# Patient Record
Sex: Female | Born: 1955
Health system: Southern US, Community
[De-identification: ages and names within clinical notes are randomized; demographics above are authoritative.]

## PROBLEM LIST (undated history)

## (undated) DIAGNOSIS — Z8616 Personal history of COVID-19: Secondary | ICD-10-CM

## (undated) DIAGNOSIS — T7840XA Allergy, unspecified, initial encounter: Secondary | ICD-10-CM

## (undated) DIAGNOSIS — F411 Generalized anxiety disorder: Secondary | ICD-10-CM

## (undated) DIAGNOSIS — I6381 Other cerebral infarction due to occlusion or stenosis of small artery: Secondary | ICD-10-CM

## (undated) DIAGNOSIS — I639 Cerebral infarction, unspecified: Secondary | ICD-10-CM

## (undated) DIAGNOSIS — E039 Hypothyroidism, unspecified: Secondary | ICD-10-CM

## (undated) DIAGNOSIS — I679 Cerebrovascular disease, unspecified: Secondary | ICD-10-CM

## (undated) DIAGNOSIS — F4321 Adjustment disorder with depressed mood: Secondary | ICD-10-CM

## (undated) DIAGNOSIS — F329 Major depressive disorder, single episode, unspecified: Secondary | ICD-10-CM

## (undated) DIAGNOSIS — I10 Essential (primary) hypertension: Secondary | ICD-10-CM

## (undated) DIAGNOSIS — K589 Irritable bowel syndrome without diarrhea: Secondary | ICD-10-CM

## (undated) DIAGNOSIS — R011 Cardiac murmur, unspecified: Secondary | ICD-10-CM

## (undated) DIAGNOSIS — F32A Depression, unspecified: Secondary | ICD-10-CM

## (undated) HISTORY — DX: Generalized anxiety disorder: F41.1

## (undated) HISTORY — DX: Cerebral infarction, unspecified: I63.9

## (undated) HISTORY — DX: Personal history of COVID-19: Z86.16

## (undated) HISTORY — DX: Irritable bowel syndrome, unspecified: K58.9

## (undated) HISTORY — DX: Other cerebral infarction due to occlusion or stenosis of small artery: I63.81

## (undated) HISTORY — DX: Major depressive disorder, single episode, unspecified: F32.9

## (undated) HISTORY — DX: Depression, unspecified: F32.A

## (undated) HISTORY — DX: Adjustment disorder with depressed mood: F43.21

## (undated) HISTORY — DX: Hypothyroidism, unspecified: E03.9

## (undated) HISTORY — DX: Cerebrovascular disease, unspecified: I67.9

## (undated) HISTORY — DX: Cardiac murmur, unspecified: R01.1

## (undated) HISTORY — PX: CHOLECYSTECTOMY: SHX55

## (undated) HISTORY — DX: Allergy, unspecified, initial encounter: T78.40XA

---

## 2020-10-23 ENCOUNTER — Other Ambulatory Visit (HOSPITAL_COMMUNITY): Payer: Self-pay

## 2020-10-25 ENCOUNTER — Other Ambulatory Visit (HOSPITAL_COMMUNITY): Payer: Self-pay

## 2020-10-30 ENCOUNTER — Emergency Department (HOSPITAL_COMMUNITY)
Admission: EM | Admit: 2020-10-30 | Discharge: 2020-10-31 | Disposition: A | Discharge status: Home / Self Care | Payer: Medicare Other | Attending: Emergency Medicine | Admitting: Emergency Medicine

## 2020-10-30 ENCOUNTER — Emergency Department (HOSPITAL_COMMUNITY): Payer: Medicare Other

## 2020-10-30 ENCOUNTER — Encounter (HOSPITAL_COMMUNITY): Payer: Self-pay | Admitting: *Deleted

## 2020-10-30 ENCOUNTER — Other Ambulatory Visit: Payer: Self-pay

## 2020-10-30 DIAGNOSIS — E876 Hypokalemia: Secondary | ICD-10-CM | POA: Insufficient documentation

## 2020-10-30 DIAGNOSIS — R519 Headache, unspecified: Secondary | ICD-10-CM | POA: Insufficient documentation

## 2020-10-30 DIAGNOSIS — D72829 Elevated white blood cell count, unspecified: Secondary | ICD-10-CM | POA: Diagnosis not present

## 2020-10-30 DIAGNOSIS — T502X5A Adverse effect of carbonic-anhydrase inhibitors, benzothiadiazides and other diuretics, initial encounter: Secondary | ICD-10-CM | POA: Insufficient documentation

## 2020-10-30 DIAGNOSIS — I10 Essential (primary) hypertension: Secondary | ICD-10-CM | POA: Diagnosis not present

## 2020-10-30 DIAGNOSIS — Z79899 Other long term (current) drug therapy: Secondary | ICD-10-CM | POA: Diagnosis not present

## 2020-10-30 DIAGNOSIS — I6782 Cerebral ischemia: Secondary | ICD-10-CM | POA: Insufficient documentation

## 2020-10-30 DIAGNOSIS — R29810 Facial weakness: Secondary | ICD-10-CM | POA: Diagnosis present

## 2020-10-30 DIAGNOSIS — G510 Bell's palsy: Secondary | ICD-10-CM | POA: Insufficient documentation

## 2020-10-30 DIAGNOSIS — R9082 White matter disease, unspecified: Secondary | ICD-10-CM | POA: Insufficient documentation

## 2020-10-30 HISTORY — DX: Essential (primary) hypertension: I10

## 2020-10-30 LAB — DIFFERENTIAL
Abs Immature Granulocytes: 0.1 10*3/uL — ABNORMAL HIGH (ref 0.00–0.07)
Basophils Absolute: 0.1 10*3/uL (ref 0.0–0.1)
Basophils Relative: 1 %
Eosinophils Absolute: 0.2 10*3/uL (ref 0.0–0.5)
Eosinophils Relative: 1 %
Immature Granulocytes: 1 %
Lymphocytes Relative: 18 %
Lymphs Abs: 3.4 10*3/uL (ref 0.7–4.0)
Monocytes Absolute: 1.2 10*3/uL — ABNORMAL HIGH (ref 0.1–1.0)
Monocytes Relative: 6 %
Neutro Abs: 14 10*3/uL — ABNORMAL HIGH (ref 1.7–7.7)
Neutrophils Relative %: 73 %

## 2020-10-30 LAB — PROTIME-INR
INR: 1 (ref 0.8–1.2)
Prothrombin Time: 12.5 seconds (ref 11.4–15.2)

## 2020-10-30 LAB — CBC
HCT: 44.9 % (ref 36.0–46.0)
Hemoglobin: 14.5 g/dL (ref 12.0–15.0)
MCH: 27.8 pg (ref 26.0–34.0)
MCHC: 32.3 g/dL (ref 30.0–36.0)
MCV: 86 fL (ref 80.0–100.0)
Platelets: 392 10*3/uL (ref 150–400)
RBC: 5.22 MIL/uL — ABNORMAL HIGH (ref 3.87–5.11)
RDW: 15.2 % (ref 11.5–15.5)
WBC: 19.1 10*3/uL — ABNORMAL HIGH (ref 4.0–10.5)
nRBC: 0 % (ref 0.0–0.2)

## 2020-10-30 LAB — COMPREHENSIVE METABOLIC PANEL
ALT: 17 U/L (ref 0–44)
AST: 22 U/L (ref 15–41)
Albumin: 3.6 g/dL (ref 3.5–5.0)
Alkaline Phosphatase: 80 U/L (ref 38–126)
Anion gap: 14 (ref 5–15)
BUN: 18 mg/dL (ref 8–23)
CO2: 24 mmol/L (ref 22–32)
Calcium: 9.3 mg/dL (ref 8.9–10.3)
Chloride: 101 mmol/L (ref 98–111)
Creatinine, Ser: 0.89 mg/dL (ref 0.44–1.00)
GFR, Estimated: >60 mL/min (ref >60)
Glucose, Bld: 134 mg/dL — ABNORMAL HIGH (ref 70–99)
Potassium: 3 mmol/L — ABNORMAL LOW (ref 3.5–5.1)
Sodium: 139 mmol/L (ref 135–145)
Total Bilirubin: 0.5 mg/dL (ref 0.3–1.2)
Total Protein: 7.9 g/dL (ref 6.5–8.1)

## 2020-10-30 LAB — APTT: aPTT: 26 seconds (ref 24–36)

## 2020-10-30 MED ORDER — SODIUM CHLORIDE 0.9% FLUSH: 3.0000 mL | Freq: Once | INTRAVENOUS | Status: DC

## 2020-10-30 MED FILL — HYDROCHLOROTHIAZIDE 25 MG T: 25 | 30 days supply | Qty: 30 | Fill #0

## 2020-10-30 MED FILL — AMLODIPINE BESYLATE 5 MG TA: 5 | 30 days supply | Qty: 30 | Fill #0

## 2020-10-30 NOTE — ED Triage Notes (Signed)
Pt reports having recent infection to right ear and being treated with antibiotics for it. Pt reports onset Saturday evening of right side facial droop. Difficulty closing right eye. No weakness noted to arms or legs. Only other complaint is headache.

## 2020-10-31 ENCOUNTER — Other Ambulatory Visit (HOSPITAL_COMMUNITY): Payer: Self-pay | Admitting: Emergency Medicine

## 2020-10-31 MED ORDER — POTASSIUM CHLORIDE CRYS ER 20 MEQ PO TBCR
40.0000 meq | EXTENDED_RELEASE_TABLET | Freq: Once | ORAL | Status: AC
  Administered 2020-10-31: 40 meq via ORAL
  Filled 2020-10-31: qty 2

## 2020-10-31 MED ORDER — VALACYCLOVIR HCL 1 G PO TABS: 1000.0000 mg | ORAL_TABLET | Freq: Three times a day (TID) | ORAL | 0 refills | Status: DC

## 2020-10-31 MED ORDER — POTASSIUM CHLORIDE CRYS ER 20 MEQ PO TBCR: EXTENDED_RELEASE_TABLET | ORAL | 0 refills | Status: DC

## 2020-10-31 NOTE — Discharge Instructions (Signed)
Use artificial tears as needed.  Because your eye does not close, you should taper it shut when you go to sleep at night.

## 2020-10-31 NOTE — ED Provider Notes (Signed)
[facility] EMERGENCY DEPARTMENT Provider Note   CSN: [id number] Arrival date & time: 10/30/20  1737   History Chief Complaint  Patient presents with  . Facial Droop    Julia Gutierrez is a 65 y.o. female.  The history is provided by the patient.  She has history of hypertension and comes in because of inability to close her right eye.  She noted an infection in the right ear about 1 week ago.  There was an associated headache.  She went to an urgent care center where she was given a prescription for amoxicillin and prednisone.  The ear does seem to be getting better, but 3 days ago she noted that she was having difficulty closing her right eye and it was feeling irritated.  Yesterday, she noted that her mouth did not seem to be working right.  She denies any weakness or numbness or tingling in her arms or legs.  Headache has been improving.  She denies fever or chills.  Past Medical History:  Diagnosis Date  . Hypertension     There are no problems to display for this patient.   History reviewed. No pertinent surgical history.   OB History   No obstetric history on file.     History reviewed. No pertinent family history.  Social History   Tobacco Use  . Smoking status: Never Smoker  Substance Use Topics  . Alcohol use: Not Currently  . Drug use: Not Currently    Home Medications Prior to Admission medications   Medication Sig Start Date End Date Taking? Authorizing Provider  potassium chloride SA (KLOR-CON) 20 MEQ tablet Take 1 tablet twice a day for the next 10 days, then take 1 tablet daily 10/31/20  Yes Dione Booze, MD  valACYclovir (VALTREX) 1000 MG tablet Take 1 tablet (1,000 mg total) by mouth 3 (three) times daily. 10/31/20  Yes Dione Booze, MD    Allergies    Patient has no active allergies.  Review of Systems   Review of Systems  All other systems reviewed and are negative.   Physical Exam Updated Vital Signs BP (!) 156/97 (BP  Location: Right Arm)   Pulse 77   Temp 98.6 F (37 C) (Oral)   Resp 18   Ht 5' 2.5" (1.588 m)   Wt 83.9 kg   SpO2 97%   BMI 33.30 kg/m   Physical Exam Vitals and nursing note reviewed.   65 year old female, resting comfortably and in no acute distress. Vital signs are significant for elevated blood pressure. Oxygen saturation is 97%, which is normal. Head is normocephalic and atraumatic. PERRLA, EOMI. Oropharynx is clear. Neck is nontender and supple without adenopathy or JVD. Back is nontender and there is no CVA tenderness. Lungs are clear without rales, wheezes, or rhonchi. Chest is nontender. Heart has regular rate and rhythm without murmur. Abdomen is soft, flat, nontender without masses or hepatosplenomegaly and peristalsis is normoactive. Extremities have no cyanosis or edema, full range of motion is present. Skin is warm and dry without rash. Neurologic: Mental status is normal, moderately severe right central facial droop is present - remainder of cranial nerves are intact, there are no motor or sensory deficits.  Motor strength is 5/5 in all 4 extremities.  There is no pronator drift.  Station and gait are normal.  ED Results / Procedures / Treatments   Labs (all labs ordered are listed, but only abnormal results are displayed) Labs Reviewed  CBC - Abnormal; Notable  for the following components:      Result Value   WBC 19.1 (*)    RBC 5.22 (*)    All other components within normal limits  DIFFERENTIAL - Abnormal; Notable for the following components:   Neutro Abs 14.0 (*)    Monocytes Absolute 1.2 (*)    Abs Immature Granulocytes 0.10 (*)    All other components within normal limits  COMPREHENSIVE METABOLIC PANEL - Abnormal; Notable for the following components:   Potassium 3.0 (*)    Glucose, Bld 134 (*)    All other components within normal limits  PROTIME-INR  APTT    EKG EKG Interpretation  Date/Time:  Monday October 30 2020 17:59:17 EST Ventricular  Rate:  100 PR Interval:  152 QRS Duration: 72 QT Interval:  340 QTC Calculation: 438 R Axis:   14 Text Interpretation: Normal sinus rhythm Nonspecific ST abnormality Abnormal ECG No old tracing to compare Confirmed by Dione Booze ([id number]) on 10/31/2020 12:05:12 AM   Radiology CT HEAD WO CONTRAST  Result Date: 10/30/2020 CLINICAL DATA:  Neuro deficit, acute, stroke suspected; facial droop. Additional history provided: Patient reports having recent infection to the right ear and being treated with antibiotics, right-sided facial droop onset Saturday, difficulty closing right eye, headache. EXAM: CT HEAD WITHOUT CONTRAST TECHNIQUE: Contiguous axial images were obtained from the base of the skull through the vertex without intravenous contrast. COMPARISON:  No pertinent prior exams available for comparison. FINDINGS: Brain: Cerebral volume is normal for age. Patchy and ill-defined hypoattenuation within the cerebral white matter is nonspecific, but most often secondary to chronic small vessel ischemia. There is no acute intracranial hemorrhage. No demarcated cortical infarct. No extra-axial fluid collection. No evidence of intracranial mass. No midline shift. Vascular: No hyperdense vessel. Atherosclerotic calcifications. Skull: Normal. Negative for fracture or focal lesion. Sinuses/Orbits: Visualized orbits show no acute finding. No significant paranasal sinus disease at the imaged levels. Other: No appreciable significant middle ear or mastoid effusion. IMPRESSION: No evidence of acute intracranial abnormality. Advanced cerebral white matter disease, nonspecific but most often secondary to chronic small vessel ischemia. Electronically Signed   By: Jackey Loge DO   On: 10/30/2020 18:55    Procedures Procedures   Medications Ordered in ED Medications  sodium chloride flush (NS) 0.9 % injection 3 mL (3 mLs Intravenous Not Given 10/31/20 0503)  potassium chloride SA (KLOR-CON) CR tablet 40 mEq (has no  administration in time range)    ED Course  I have reviewed the triage vital signs and the nursing notes.  Pertinent labs & imaging results that were available during my care of the patient were reviewed by me and considered in my medical decision making (see chart for details).  MDM Rules/Calculators/A&P Right-sided Bell's palsy.  Given the fact that she developed Bell's palsy, I suspect that her ear pain was probably a prodrome of Bell's palsy and not a true ear infection.  She has already been on steroids, no need to continue there is.  She did have a work-up ordered prior to my seeing her.  ECG shows minor nonspecific ST changes with no prior ECG available for comparison.  CT of the head shows no acute process.  Labs show leukocytosis which is probably related to recent steroid use, and hypokalemia which is most likely secondary to the diuretic she is taking for her blood pressure.  She is given a dose of oral potassium and she is discharged with prescription for oral potassium and for valacyclovir for 1week.  Advised to tape her right eye shut at night.  Old records are reviewed, and she has no relevant past visits.  Final Clinical Impression(s) / ED Diagnoses Final diagnoses:  Right-sided Bell's palsy  Elevated blood pressure reading with diagnosis of hypertension  Diuretic-induced hypokalemia    Rx / DC Orders ED Discharge Orders         Ordered    potassium chloride SA (KLOR-CON) 20 MEQ tablet        10/31/20 0503    valACYclovir (VALTREX) 1000 MG tablet  3 times daily        10/31/20 0503           Dione Booze, MD 10/31/20 [phone]

## 2020-11-01 MED FILL — valACYclovir HCL 1 GM TABS: 1 | 7 days supply | Qty: 21 | Fill #0

## 2020-11-01 MED FILL — POTASSIUM CHLORIDE CRYS ER: 20 | 30 days supply | Qty: 40 | Fill #0

## 2020-11-29 ENCOUNTER — Other Ambulatory Visit (HOSPITAL_COMMUNITY): Payer: Self-pay | Admitting: Registered Nurse

## 2020-11-29 MED FILL — AMLODIPINE BESYLATE 5 MG TA: 5 | 90 days supply | Qty: 90 | Fill #0

## 2020-11-29 MED FILL — HYDROCHLOROTHIAZIDE 25 MG T: 25 | 90 days supply | Qty: 90 | Fill #0

## 2021-02-27 MED FILL — Amlodipine Besylate Tab 5 MG (Base Equivalent): ORAL | 90 days supply | Qty: 90 | Fill #0 | Status: AC

## 2021-02-27 MED FILL — Hydrochlorothiazide Tab 25 MG: ORAL | 90 days supply | Qty: 90 | Fill #0 | Status: AC

## 2021-02-28 ENCOUNTER — Other Ambulatory Visit (HOSPITAL_COMMUNITY): Payer: Self-pay

## 2021-03-06 ENCOUNTER — Other Ambulatory Visit (HOSPITAL_COMMUNITY): Payer: Self-pay

## 2021-04-13 ENCOUNTER — Encounter (HOSPITAL_BASED_OUTPATIENT_CLINIC_OR_DEPARTMENT_OTHER): Payer: Self-pay | Admitting: Nurse Practitioner

## 2021-04-13 ENCOUNTER — Ambulatory Visit (INDEPENDENT_AMBULATORY_CARE_PROVIDER_SITE_OTHER): Payer: Medicare Other | Admitting: Nurse Practitioner

## 2021-04-13 ENCOUNTER — Other Ambulatory Visit: Payer: Self-pay

## 2021-04-13 ENCOUNTER — Other Ambulatory Visit (HOSPITAL_COMMUNITY): Payer: Self-pay

## 2021-04-13 VITALS — BP 145/85 | HR 87 | Ht 63.0 in | Wt 181.2 lb

## 2021-04-13 DIAGNOSIS — Z780 Asymptomatic menopausal state: Secondary | ICD-10-CM

## 2021-04-13 DIAGNOSIS — Z7689 Persons encountering health services in other specified circumstances: Secondary | ICD-10-CM | POA: Diagnosis not present

## 2021-04-13 DIAGNOSIS — Z130 Encounter for screening for diseases of the blood and blood-forming organs and certain disorders involving the immune mechanism: Secondary | ICD-10-CM

## 2021-04-13 DIAGNOSIS — Z13820 Encounter for screening for osteoporosis: Secondary | ICD-10-CM

## 2021-04-13 DIAGNOSIS — M350C Sjogren syndrome with dental involvement: Secondary | ICD-10-CM | POA: Insufficient documentation

## 2021-04-13 DIAGNOSIS — H539 Unspecified visual disturbance: Secondary | ICD-10-CM

## 2021-04-13 DIAGNOSIS — Z1211 Encounter for screening for malignant neoplasm of colon: Secondary | ICD-10-CM

## 2021-04-13 DIAGNOSIS — R631 Polydipsia: Secondary | ICD-10-CM

## 2021-04-13 DIAGNOSIS — R42 Dizziness and giddiness: Secondary | ICD-10-CM

## 2021-04-13 DIAGNOSIS — R5382 Chronic fatigue, unspecified: Secondary | ICD-10-CM

## 2021-04-13 DIAGNOSIS — Z0000 Encounter for general adult medical examination without abnormal findings: Secondary | ICD-10-CM

## 2021-04-13 DIAGNOSIS — F419 Anxiety disorder, unspecified: Secondary | ICD-10-CM

## 2021-04-13 DIAGNOSIS — Z1321 Encounter for screening for nutritional disorder: Secondary | ICD-10-CM

## 2021-04-13 DIAGNOSIS — E039 Hypothyroidism, unspecified: Secondary | ICD-10-CM

## 2021-04-13 DIAGNOSIS — Z6832 Body mass index (BMI) 32.0-32.9, adult: Secondary | ICD-10-CM

## 2021-04-13 DIAGNOSIS — G4486 Cervicogenic headache: Secondary | ICD-10-CM

## 2021-04-13 DIAGNOSIS — R002 Palpitations: Secondary | ICD-10-CM

## 2021-04-13 DIAGNOSIS — Z1231 Encounter for screening mammogram for malignant neoplasm of breast: Secondary | ICD-10-CM

## 2021-04-13 DIAGNOSIS — Z1159 Encounter for screening for other viral diseases: Secondary | ICD-10-CM

## 2021-04-13 DIAGNOSIS — R682 Dry mouth, unspecified: Secondary | ICD-10-CM

## 2021-04-13 DIAGNOSIS — Z8639 Personal history of other endocrine, nutritional and metabolic disease: Secondary | ICD-10-CM

## 2021-04-13 DIAGNOSIS — Z114 Encounter for screening for human immunodeficiency virus [HIV]: Secondary | ICD-10-CM

## 2021-04-13 DIAGNOSIS — R531 Weakness: Secondary | ICD-10-CM

## 2021-04-13 DIAGNOSIS — M899 Disorder of bone, unspecified: Secondary | ICD-10-CM

## 2021-04-13 DIAGNOSIS — F32A Depression, unspecified: Secondary | ICD-10-CM

## 2021-04-13 DIAGNOSIS — Z131 Encounter for screening for diabetes mellitus: Secondary | ICD-10-CM

## 2021-04-13 DIAGNOSIS — H04123 Dry eye syndrome of bilateral lacrimal glands: Secondary | ICD-10-CM

## 2021-04-13 DIAGNOSIS — Z23 Encounter for immunization: Secondary | ICD-10-CM

## 2021-04-13 DIAGNOSIS — Z13228 Encounter for screening for other metabolic disorders: Secondary | ICD-10-CM

## 2021-04-13 DIAGNOSIS — Z1329 Encounter for screening for other suspected endocrine disorder: Secondary | ICD-10-CM

## 2021-04-13 HISTORY — DX: Chronic fatigue, unspecified: R53.82

## 2021-04-13 HISTORY — DX: Anxiety disorder, unspecified: F41.9

## 2021-04-13 HISTORY — DX: Dizziness and giddiness: R42

## 2021-04-13 HISTORY — DX: Unspecified visual disturbance: H53.9

## 2021-04-13 HISTORY — DX: Cervicogenic headache: G44.86

## 2021-04-13 HISTORY — DX: Sjogren syndrome with dental involvement: M35.0C

## 2021-04-13 HISTORY — DX: Polydipsia: R63.1

## 2021-04-13 HISTORY — DX: Persons encountering health services in other specified circumstances: Z76.89

## 2021-04-13 HISTORY — DX: Dry mouth, unspecified: R68.2

## 2021-04-13 HISTORY — DX: Palpitations: R00.2

## 2021-04-13 HISTORY — DX: Encounter for general adult medical examination without abnormal findings: Z00.00

## 2021-04-13 LAB — POCT GLYCOSYLATED HEMOGLOBIN (HGB A1C): HbA1c POC (<> result, manual entry): 5.2 % (ref 4.0–5.6)

## 2021-04-13 MED ORDER — ZOSTER VAC RECOMB ADJUVANTED 50 MCG/0.5ML IM SUSR
0.5000 mL | Freq: Once | INTRAMUSCULAR | 1 refills | Status: DC
  Filled 2021-04-13: qty 0.5, 1d supply, fill #0

## 2021-04-13 MED ORDER — ZOSTER VAC RECOMB ADJUVANTED 50 MCG/0.5ML IM SUSR: 0.5000 mL | Freq: Once | INTRAMUSCULAR | 1 refills | Status: AC

## 2021-04-13 NOTE — Progress Notes (Signed)
[name], DNP, AGNP-c Primary Care Services ______________________________________________________________________  HPI Julia Gutierrez is a 65 y.o. female presenting to [facility] today to establish care.   Patient Care Team: Keishon Chavarin, Coralee Pesa, NP as PCP - General (Nurse Practitioner)  Health Maintenance  Topic Date Due   Hepatitis C Screening: USPSTF Recommendation to screen - Ages 61-79 yo.  Never done   Tetanus Vaccine  Never done   Colon Cancer Screening  Never done   Mammogram  Never done   Zoster (Shingles) Vaccine (1 of 2) Never done   DEXA scan (bone density measurement)  Never done   Pneumonia vaccines (1 of 2 - PCV13) Never done   COVID-19 Vaccine (2 - Moderna series) 10/01/2020   Flu Shot  05/07/2021   HPV Vaccine  Aged Out     Concerns today: Establish Care. Discuss ongoing dizziness, fatigue, HA, neck pain  Dizziness Dizzy spells off and on since 2019 Went to hospital with dehydration and decreased kidney function at one point- resolved In January dizzy all of the time, even when sitting still- facial droop presented- seen in UC and ED Dx with Bells palsy and treated with steroids  Improvement with facial droop, but not complete resolution CT negative for infarction Dizziness no better and maybe even worse since that time Feels like she is moving even when still. Movement makes worse Walking is difficult- feels like she may fall, "bumps into walls" "Feel like what I presume as drunk" "Ive gone downhill all over" "Not doing much from being dizzy and I think everything is crashing" When leaning head back and rubbing carotid arteries reports increased dizziness. Was on thyroid medication until 2012 and moved - reports that she was unable to afford the medication at the time and stopped taking it- forgot about it.  Meclizine can help a "teeny bit" when it is really severe.  In the beginning took this  scheduled and felt that it did help a little, but nothing significant Pain, itching, and pressure in ears all the time with right side worse than left.  No loss of consciousness or blacking out, no one sided weakness, no focal deficit, no hx stroke.   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  ASA 377m is helpful for symptoms.  She also endorses changes in her night time vision with the right eye seeing normal and left eye things look "grayed out". This improves when turning on the lights.  Endorses dry eyes since 2019 and dry mouth since 2020. No known cause or change in medications.  Reports "lines" in the tongue. Intermittent palpitations.  Feels like swallowing is difficult and sometimes chokes on water. Unable to swallow large pills. Mucous in throat upon awakening with dry mouth Fatigue with intense waves where "I have to lay down immediately" "I have no energy at all" She feels her hearing is decreased in the recent years. She denies constant ringing.   Reports WBC has been elevated for several years. She tells me her previous provider wanted her to see oncology for evaluation with concerns for lymphoma. She tells me she does not feel that she has cancer and did not go.   Patient Active Problem List   Diagnosis Date Noted   Encounter to establish care 04/13/2021  Vertigo 04/13/2021   Chronic fatigue 04/13/2021   Cervicogenic headache 04/13/2021   Hypothyroidism 04/13/2021   Mouth dryness 04/13/2021   Bilateral dry eyes 04/13/2021   Anxiety and depression 04/13/2021   Increased thirst 04/13/2021   Vision changes 04/13/2021   Palpitations 04/13/2021    PHQ9 Today: Depression screen PHQ 2/9 04/13/2021  Decreased Interest 1  Down, Depressed, Hopeless 1  PHQ - 2 Score 2  Altered sleeping 3  Tired, decreased  energy 3  Change in appetite 3  Feeling bad or failure about yourself  3  Trouble concentrating 3  Moving slowly or fidgety/restless 3  Suicidal thoughts 0  PHQ-9 Score 20  Difficult doing work/chores Somewhat difficult   GAD7 Today: GAD 7 : Generalized Anxiety Score 04/13/2021  Nervous, Anxious, on Edge 0  Control/stop worrying 2  Worry too much - different things 2  Trouble relaxing 0  Restless 0  Easily annoyed or irritable 2  Afraid - awful might happen 2  Total GAD 7 Score 8  Anxiety Difficulty Somewhat difficult   ______________________________________________________________________ PMH Past Medical History:  Diagnosis Date   Hypertension    Hypothyroidism     ROS All review of systems negative except what is listed in the HPI  PHYSICAL EXAM Physical Exam Vitals and nursing note reviewed.  HENT:     Head: Normocephalic and atraumatic.     Right Ear: Tympanic membrane, ear canal and external ear normal.     Left Ear: Tympanic membrane, ear canal and external ear normal.     Nose: Nose normal.     Mouth/Throat:     Mouth: Mucous membranes are pale and dry.     Pharynx: Oropharynx is clear. No oropharyngeal exudate or posterior oropharyngeal erythema.     Comments: Geographic tongue present.  Oral MM dry Eyes:     General: Lids are normal. Vision grossly intact. No visual field deficit.    Extraocular Movements:     Right eye: Nystagmus present.     Left eye: No nystagmus.     Conjunctiva/sclera: Conjunctivae normal.     Pupils:     Right eye: Pupil is sluggish.     Left eye: Pupil is sluggish.     Funduscopic exam:    Right eye: Red reflex present.        Left eye: Red reflex present.    Visual Fields: Right eye visual fields normal and left eye visual fields normal.     Comments: Right eye gaze appears very slightly deviated to the right.  Left eye gaze intact.   Neck:     Thyroid: No thyroid mass, thyromegaly or thyroid tenderness.     Vascular: No  carotid bruit.     Trachea: Trachea and phonation normal.  Cardiovascular:     Rate and Rhythm: Normal rate and regular rhythm.     Pulses: Normal pulses.     Heart sounds: Murmur heard.  Systolic murmur is present with a grade of 3/6.     Comments: Reports lifelong history of murmur with previous cardiac work-up  Pulmonary:     Effort: Pulmonary effort is normal.     Breath sounds: Normal breath sounds.  Chest:     Chest wall: No tenderness.  Breasts:    Right: No supraclavicular adenopathy.     Left: Supraclavicular adenopathy present.     Comments: Single left supraclavicular noted- firm and mobile approximately 1.5cm Abdominal:     General: Abdomen is flat. Bowel sounds are  normal. There is no distension or abdominal bruit.     Palpations: Abdomen is soft. There is no shifting dullness, fluid wave, hepatomegaly, splenomegaly, mass or pulsatile mass.     Tenderness: There is generalized abdominal tenderness. There is no right CVA tenderness, left CVA tenderness, guarding or rebound.     Comments: Reports abdomen is "always tender"  Musculoskeletal:        General: Normal range of motion.     Cervical back: Normal range of motion. No rigidity, tenderness or crepitus. Pain with movement, spinous process tenderness and muscular tenderness present. Normal range of motion.     Right lower leg: No edema.     Left lower leg: No edema.     Comments: Generalized weakness with no single sided deficit noted.   Lymphadenopathy:     Upper Body:     Right upper body: No supraclavicular adenopathy.     Left upper body: Supraclavicular adenopathy present.  Skin:    General: Skin is warm and dry.     Capillary Refill: Capillary refill takes less than 2 seconds.  Neurological:     General: No focal deficit present.     Mental Status: She is alert and oriented to person, place, and time.     Cranial Nerves: Cranial nerve deficit present.     Sensory: No sensory deficit.     Motor: Weakness  present.     Coordination: Romberg sign positive. Coordination normal. Rapid alternating movements normal.     Gait: Gait abnormal.     Deep Tendon Reflexes: Reflexes normal.     Comments: Slight facial deficit noted on right- patient reports residual from [place] in January.  Gait is staggering with frequent use of wall to help steady.   Psychiatric:        Attention and Perception: Attention normal.        Mood and Affect: Mood normal. Affect is flat.        Speech: Speech normal.        Behavior: Behavior normal. Behavior is cooperative.        Thought Content: Thought content normal. Thought content does not include suicidal ideation. Thought content does not include suicidal plan.        Cognition and Memory: Cognition and memory normal.        Judgment: Judgment normal.   ______________________________________________________________________ ASSESSMENT AND PLAN Problem List Items Addressed This Visit     Encounter to establish care - Primary    Review of current and past medical history, social history, medication, and family history.  Review of care gaps and health maintenance recommendations.  Records from recent providers to be requested if not available in Chart Review or Care Everywhere.  Recommendations for health maintenance, diet, and exercise provided.  Recommend: Tdap Pneumovax Shingrix Cologaurd DEXA Mammogram Orders placed        Vertigo    Vertigo x3 years with significant worsening in January with episode of reported Bells Palsy.  CT reviewed from that time and no acute abnormalities noted. Small vessel ischemia was present, which could indicate possible infarction, but this is unclear.  Will obtain labs today in the setting of multiple symptoms. No alarm symptoms present today and no acute symptoms.  It is possible that she is experiencing prolonged vertigo, however, the length of time and associated symptoms raise question that there is more  happening.  Consider MRI of brain if labs are unrevealing.  Relevant Orders   CBC with Differential/Platelet   Comprehensive metabolic panel   Lipid panel   POCT glycosylated hemoglobin (Hb A1C) (Completed)   VITAMIN D 25 Hydroxy (Vit-D Deficiency, Fractures)   Thyroid Panel With TSH   ANA   C-reactive protein   RPR   Hepatitis C Antibody   Chronic fatigue    Ongoing and worsening fatigue for months. Worsening since January of this year.  Concern for untreated hypothyroid contributing to symptoms. Other symptoms present make me question additional causes, as well. Etiology is not quite clear today.  No alarm symptoms present.  Will obtain labs        Relevant Orders   CBC with Differential/Platelet   Comprehensive metabolic panel   Lipid panel   POCT glycosylated hemoglobin (Hb A1C) (Completed)   VITAMIN D 25 Hydroxy (Vit-D Deficiency, Fractures)   Thyroid Panel With TSH   ANA   C-reactive protein   RPR   Hepatitis C Antibody   Cervicogenic headache    Daily cervicogenic headache that responds to ASA 371m.  CT in January reviewed and small vessel ischemia is present.  BP is slightly elevated today, but reportedly normal at home.  No alarm symptoms present.  Slight deviation of right eye gaze and nystagmus with vertigo, but this has been present since CT and not changed.  Will obtain labs. Consider MRI for further evaluation if labs unrevealing        Relevant Medications   aspirin 325 MG tablet   ibuprofen (ADVIL) 800 MG tablet   Other Relevant Orders   CBC with Differential/Platelet   Comprehensive metabolic panel   Lipid panel   POCT glycosylated hemoglobin (Hb A1C) (Completed)   VITAMIN D 25 Hydroxy (Vit-D Deficiency, Fractures)   Thyroid Panel With TSH   ANA   C-reactive protein   RPR   Hepatitis C Antibody   Hypothyroidism    Hx of hypothyroidism- off medications since about 2012.  Multiple concerning symptoms present that could be related to  uncontrolled thyroid function. Will obtain labs today for evaluation and determination if this is the cause of symptoms.  Given previous history, this is likely at least a factor in her condition.        Relevant Orders   CBC with Differential/Platelet   Comprehensive metabolic panel   Lipid panel   POCT glycosylated hemoglobin (Hb A1C) (Completed)   VITAMIN D 25 Hydroxy (Vit-D Deficiency, Fractures)   Thyroid Panel With TSH   ANA   C-reactive protein   RPR   Hepatitis C Antibody   Mouth dryness    Dry eyes and dry mouth. Hx of hypothyroidism- unknown cause- no treatment.  Consider possible autoimmune component or reaction to untreated hypothyroidism.  At this time will obtain labs for further evaluation.  A1c 5.2% today.        Relevant Orders   CBC with Differential/Platelet   Comprehensive metabolic panel   Lipid panel   POCT glycosylated hemoglobin (Hb A1C) (Completed)   VITAMIN D 25 Hydroxy (Vit-D Deficiency, Fractures)   Thyroid Panel With TSH   ANA   C-reactive protein   RPR   Hepatitis C Antibody   Bilateral dry eyes    Consider possible sjogrens vs. Uncontrolled hypothyroidism.  Labs today       Relevant Orders   CBC with Differential/Platelet   Comprehensive metabolic panel   Lipid panel   POCT glycosylated hemoglobin (Hb A1C) (Completed)   VITAMIN D 25 Hydroxy (Vit-D Deficiency, Fractures)  Thyroid Panel With TSH   ANA   C-reactive protein   RPR   Hepatitis C Antibody   Anxiety and depression    Significant symptoms present reportedly due to her current physical concerns.  She declines the offer for treatment, but tells me that if her symptoms continue once she is able to feel better she will consider.  I do feel that anxiety and depression symptoms are stemming from her current state, however, I am concerned that these could be exacerbating some symptoms in a cyclic nature. My biggest concern is the amount of time finding a diagnosis may take and  the effect this may have on her mental health.  Will follow this and encourage her to consider treatment, even for the short term, while we work to help her return to her baseline.        Relevant Orders   CBC with Differential/Platelet   Comprehensive metabolic panel   Lipid panel   POCT glycosylated hemoglobin (Hb A1C) (Completed)   VITAMIN D 25 Hydroxy (Vit-D Deficiency, Fractures)   Thyroid Panel With TSH   ANA   C-reactive protein   RPR   Hepatitis C Antibody   Increased thirst    Increased dry eyes, dry mouth, and thirst. Consider possible thyroid and/or Sjogrens.  Will obtain labs today.  A1c 5.2%        Relevant Orders   CBC with Differential/Platelet   Comprehensive metabolic panel   Lipid panel   POCT glycosylated hemoglobin (Hb A1C) (Completed)   VITAMIN D 25 Hydroxy (Vit-D Deficiency, Fractures)   Thyroid Panel With TSH   ANA   C-reactive protein   Vision changes    Dry eyes and decreased night vision of color reported in the left eye.  Consider possible sjogrens and/or uncontrolled thyroid. Given the dizziness and weakness she is experiencing, I do have concern for other factors. Right eye gaze is very slightly deviated to the right, but no pronounced deficits otherwise.  CT reviewed from ED visit with no acute abnormalities. There were white matter changes and small vessel ischemia noted, which could be causing some issues.  If labs are unrevealing- will consider MRI of brain for further evaluation.        Relevant Orders   CBC with Differential/Platelet   Comprehensive metabolic panel   Lipid panel   POCT glycosylated hemoglobin (Hb A1C) (Completed)   VITAMIN D 25 Hydroxy (Vit-D Deficiency, Fractures)   Thyroid Panel With TSH   ANA   C-reactive protein   RPR   Hepatitis C Antibody   Palpitations    Intermittent palpitations reported. Hx of hypothyroidism with no treatment since 2012. HRR today. Systolic murmur present over aortic valve. She  reports this has been present her entire life. Reports previous work-up with no abnormalities.  Will request records from cardiologist to determine if further work-up needed.  No alarm symptoms present at this time.        Relevant Orders   CBC with Differential/Platelet   Comprehensive metabolic panel   Lipid panel   POCT glycosylated hemoglobin (Hb A1C) (Completed)   VITAMIN D 25 Hydroxy (Vit-D Deficiency, Fractures)   Thyroid Panel With TSH   ANA   C-reactive protein   RPR   Hepatitis C Antibody   Other Visit Diagnoses     Screening for HIV without presence of risk factors       Encounter for hepatitis C screening test for low risk patient  Relevant Orders   CBC with Differential/Platelet   Hepatitis C Antibody   Screening for deficiency anemia       Relevant Orders   CBC with Differential/Platelet   VITAMIN D 25 Hydroxy (Vit-D Deficiency, Fractures)   Encounter for vitamin deficiency screening       Relevant Orders   Comprehensive metabolic panel   VITAMIN D 25 Hydroxy (Vit-D Deficiency, Fractures)   Screening for endocrine, nutritional, metabolic and immunity disorder       Relevant Orders   CBC with Differential/Platelet   Comprehensive metabolic panel   Lipid panel   POCT glycosylated hemoglobin (Hb A1C) (Completed)   VITAMIN D 25 Hydroxy (Vit-D Deficiency, Fractures)   Thyroid Panel With TSH   ANA   C-reactive protein   RPR   Hepatitis C Antibody   Screening for diabetes mellitus       Relevant Orders   CBC with Differential/Platelet   Comprehensive metabolic panel   POCT glycosylated hemoglobin (Hb A1C) (Completed)   Personal history of other endocrine, nutritional and metabolic disease        Relevant Orders   POCT glycosylated hemoglobin (Hb A1C) (Completed)   Disorder of bone, unspecified        Relevant Orders   VITAMIN D 25 Hydroxy (Vit-D Deficiency, Fractures)   Screening for colon cancer       Relevant Orders   Cologuard   Encounter for  screening mammogram for malignant neoplasm of breast       Relevant Orders   MM Digital Screening   Screening for osteoporosis       Relevant Orders   DG Bone Density   Need for shingles vaccine       Relevant Medications   Zoster Vaccine Adjuvanted [facility]) injection   Asymptomatic menopausal state        Relevant Orders   DG Bone Density   Body mass index (BMI) of 32.0-32.9 in adult           Education provided today during visit and on AVS for patient to review at home.  Diet and Exercise recommendations provided.  Current diagnoses and recommendations discussed. HM recommendations reviewed with recommendations.    Outpatient Encounter Medications as of 04/13/2021  Medication Sig   amLODipine (NORVASC) 5 MG tablet TAKE 1 TABLET BY MOUTH ONCE DAILY.   aspirin 325 MG tablet Take 325 mg by mouth daily.   hydrochlorothiazide (HYDRODIURIL) 25 MG tablet TAKE 1 TABLET BY MOUTH ONCE DAILY.   ibuprofen (ADVIL) 800 MG tablet Take 800 mg by mouth every 8 (eight) hours as needed.   Multiple Vitamins-Minerals (MULTIVITAMIN WITH MINERALS) tablet Take 1 tablet by mouth daily.   [DISCONTINUED] amLODipine (NORVASC) 5 MG tablet TAKE 1 TABLET BY MOUTH ONCE DAILY   [DISCONTINUED] hydrochlorothiazide (HYDRODIURIL) 25 MG tablet TAKE 1 TABLET BY MOUTH DAILY   [DISCONTINUED] potassium chloride SA (KLOR-CON) 20 MEQ tablet TAKE 1 TABLET BY MOUTH TWICE DAILY FOR THE NEXT 10 DAYS THEN 1 TABLET DAILY.   [DISCONTINUED] valACYclovir (VALTREX) 1000 MG tablet TAKE 1 TABLET BY MOUTH THREE TIMES DAILY.   [DISCONTINUED] Zoster Vaccine Adjuvanted [facility]) injection Inject 0.5 mLs into the muscle once for 1 dose. Repeat in 2-6 months. Please fax confirmation of vaccination to [name] [phone]   Zoster Vaccine Adjuvanted [facility]) injection Inject 0.5 mLs into the muscle once for 1 dose. Repeat in 2-6 months. Please fax confirmation of vaccination to [facility] [phone]   No  facility-administered encounter medications on file as  of 04/13/2021.    No follow-ups on file.  Time: 75 minutes, >50% spent counseling, care coordination, chart review, and documentation.   Orma Render, DNP, AGNP-c

## 2021-04-13 NOTE — Assessment & Plan Note (Signed)
Dry eyes and decreased night vision of color reported in the left eye.  Consider possible sjogrens and/or uncontrolled thyroid. Given the dizziness and weakness she is experiencing, I do have concern for other factors. Right eye gaze is very slightly deviated to the right, but no pronounced deficits otherwise.  CT reviewed from ED visit with no acute abnormalities. There were white matter changes and small vessel ischemia noted, which could be causing some issues.  If labs are unrevealing- will consider MRI of brain for further evaluation.

## 2021-04-13 NOTE — Assessment & Plan Note (Signed)
Significant symptoms present reportedly due to her current physical concerns.  She declines the offer for treatment, but tells me that if her symptoms continue once she is able to feel better she will consider.  I do feel that anxiety and depression symptoms are stemming from her current state, however, I am concerned that these could be exacerbating some symptoms in a cyclic nature. My biggest concern is the amount of time finding a diagnosis may take and the effect this may have on her mental health.  Will follow this and encourage her to consider treatment, even for the short term, while we work to help her return to her baseline.

## 2021-04-13 NOTE — Assessment & Plan Note (Signed)
Consider possible sjogrens vs. Uncontrolled hypothyroidism.  Labs today

## 2021-04-13 NOTE — Assessment & Plan Note (Signed)
Intermittent palpitations reported. Hx of hypothyroidism with no treatment since 2012. HRR today. Systolic murmur present over aortic valve. She reports this has been present her entire life. Reports previous work-up with no abnormalities.  Will request records from cardiologist to determine if further work-up needed.  No alarm symptoms present at this time.

## 2021-04-13 NOTE — Assessment & Plan Note (Signed)
Daily cervicogenic headache that responds to ASA 325mg .  CT in January reviewed and small vessel ischemia is present.  BP is slightly elevated today, but reportedly normal at home.  No alarm symptoms present.  Slight deviation of right eye gaze and nystagmus with vertigo, but this has been present since CT and not changed.  Will obtain labs. Consider MRI for further evaluation if labs unrevealing

## 2021-04-13 NOTE — Assessment & Plan Note (Addendum)
Review of current and past medical history, social history, medication, and family history.  Review of care gaps and health maintenance recommendations.  Records from recent providers to be requested if not available in Chart Review or Care Everywhere.  Recommendations for health maintenance, diet, and exercise provided.  Recommend: Tdap Pneumovax Shingrix Cologaurd DEXA Mammogram Orders placed

## 2021-04-13 NOTE — Assessment & Plan Note (Signed)
Increased dry eyes, dry mouth, and thirst. Consider possible thyroid and/or Sjogrens.  Will obtain labs today.  A1c 5.2%

## 2021-04-13 NOTE — Assessment & Plan Note (Signed)
Vertigo x3 years with significant worsening in January with episode of reported Bells Palsy.  CT reviewed from that time and no acute abnormalities noted. Small vessel ischemia was present, which could indicate possible infarction, but this is unclear.  Will obtain labs today in the setting of multiple symptoms. No alarm symptoms present today and no acute symptoms.  It is possible that she is experiencing prolonged vertigo, however, the length of time and associated symptoms raise question that there is more happening.  Consider MRI of brain if labs are unrevealing.

## 2021-04-13 NOTE — Assessment & Plan Note (Signed)
Hx of hypothyroidism- off medications since about 2012.  Multiple concerning symptoms present that could be related to uncontrolled thyroid function. Will obtain labs today for evaluation and determination if this is the cause of symptoms.  Given previous history, this is likely at least a factor in her condition.

## 2021-04-13 NOTE — Patient Instructions (Addendum)
Recommendations from today's visit: I have ordered several labs for review today. Based on these results, I am hopeful we will be able to find a clearer picture of what is causing your symptoms and work together to get you feeling better sooner.  I will hold off on imaging at this time, but if the results of the labs are inconclusive, we may need to consider a brain MRI to make sure that there is nothing going on that could have been missed with the CT scan. We can discuss this if needed.  There are several items listed on your chart that you are due for-  Mammogram:  I have placed an order for this. They will call you and you can schedule this at your convenience DEXA- This is a bone density scan. I have placed an order for this. They will call you to schedule this at your convenience.  Colon Cancer Screening- I have placed an order for Cologuard. This is a kit mailed to your home and you provide a stool sample and mail it back to the company. If there are any indications of possible colon cancer, they will let us know for further screening.  There are recommended vaccines that are showing incomplete Zoster Vaccine- this is the shingles vaccine. This is given in 2 doses at the pharmacy. I have printed a prescription for you to have this done at your convenience. Take the prescription to the pharmacy of your choice to have this administered.  Pneumonia Vaccine- this is two doses of different vaccines to protect against pneumonia. We can do this in the office, when you are ready to have this done.  Tdap- This is the tetanus booster- it is recommended every 10 years. You can request this at the pharmacy of your choice. Please let us know when and where you have this done. No prescription is needed.  If you have had any of these done recently, please let us know where and when these were done and we will update our records.   Information on diet, exercise, and health maintenance recommendations are listed  below. This is information to help you be sure you are on track for optimal health and monitoring.   Please look over this and let us know if you have any questions or if you have completed any of the health maintenance outside of [place] so that we can be sure your records are up to date.  ___________________________________________________________  Thank you for choosing [place] [facility] for your Primary Care needs. I am excited for the opportunity to partner with you to meet your health care goals. It was a pleasure meeting you today!  For your information, our office hours are Monday- Friday 8:00 AM - 5:00 PM At this time I am not in the office on Wednesdays.  If you have questions or concerns, please call our office at [phone] or send [place] a MyChart message and we will respond as quickly as possible.   For all urgent or time sensitive needs we ask that you please call the office to avoid delays. MyChart is not constantly monitored and replies may take up to 72 business hours.  MyChart Policy: MyChart allows for you to see your visit notes, after visit summary, provider recommendations, lab and tests results, make an appointment, request refills, and contact your provider or the office for non-urgent questions or concerns.  Providers are seeing patients during normal business hours and do not have built  in time to review MyChart messages. We ask that you allow a minimum of 72 business hours for MyChart message responses.  Complex MyChart concerns may require a visit. Your provider may request you schedule a virtual or in person visit to ensure we are providing the best care possible. MyChart messages sent after 4:00 PM on Friday will not be received by the provider until Monday morning.    Lab and Test Results: You will receive your lab and test results on MyChart as soon as they are completed and results have been sent by the lab or testing facility. Due  to this service, you will receive your results BEFORE your provider.  Please allow a minimum of 72 business hours for your provider to receive and review lab and test results and contact you about.   Most lab and test result comments from the provider will be sent through [place]. Your provider may recommend changes to the plan of care, follow-up visits, repeat testing, ask questions, or request an office visit to discuss these results. You may reply directly to this message or call the office at [phone] to provide information for the provider or set up an appointment. In some instances, you will be called with test results and recommendations. Please let us know if this is preferred and we will make note of this in your chart to provide this for you.    If you have not heard a response to your lab or test results in 72 business hours, please call the office to let us know.   After Hours: For all non-emergency after hours needs, please call the office at [phone] and select the option to reach the on-call provider service. On-call services are shared between multiple [place] offices and therefore it will not be possible to speak directly with your provider. On-call providers may provide medical advice and recommendations, but are unable to provide refills for maintenance medications.  For all emergency or urgent medical needs after normal business hours, we recommend that you seek care at the closest Urgent Care or Emergency Department to ensure appropriate treatment in a timely manner.  [facility] [place] has a 24 hour emergency room located on the ground floor for your convenience.    Please do not hesitate to reach out to [place] with concerns.   Thank you, again, for choosing me as your health care partner. I appreciate your trust and look forward to learning more about you.   [name], DNP,  AGNP-c ___________________________________________________________  Health Maintenance Recommendations Screening Testing Mammogram Every 1 -2 years based on history and risk factors Starting at age 45 Pap Smear Ages 21-39 every 3 years Ages 87-65 every 5 years with HPV testing More frequent testing may be required based on results and history Colon Cancer Screening Every 1-10 years based on test performed, risk factors, and history Starting at age 82 Bone Density Screening Every 2-10 years based on history Starting at age 41 for women Recommendations for men differ based on medication usage, history, and risk factors AAA Screening One time ultrasound Men 70-84 years old who have every smoked Lung Cancer Screening Low Dose Lung CT every 12 months Age 31-80 years with a 30 pack-year smoking history who still smoke or who have quit within the last 15 years  Screening Labs Routine  Labs: Complete Blood Count (CBC), Complete Metabolic Panel (CMP), Cholesterol (Lipid Panel) Every 6-12 months based on history and medications May be recommended more frequently based on  current conditions or previous results Hemoglobin A1c Lab Every 3-12 months based on history and previous results Starting at age 4 or earlier with diagnosis of diabetes, high cholesterol, BMI >26, and/or risk factors Frequent monitoring for patients with diabetes to ensure blood sugar control Thyroid Panel (TSH w/ T3 & T4) Every 6 months based on history, symptoms, and risk factors May be repeated more often if on medication HIV One time testing for all patients 31 and older May be repeated more frequently for patients with increased risk factors or exposure Hepatitis C One time testing for all patients 58 and older May be repeated more frequently for patients with increased risk factors or exposure Gonorrhea, Chlamydia Every 12 months for all sexually active persons 13-24 years Additional monitoring may be  recommended for those who are considered high risk or who have symptoms PSA Men 69-56 years old with risk factors Additional screening may be recommended from age 79-69 based on risk factors, symptoms, and history  Vaccine Recommendations Tetanus Booster All adults every 10 years Flu Vaccine All patients 6 months and older every year COVID Vaccine All patients 12 years and older Initial dosing with booster May recommend additional booster based on age and health history HPV Vaccine 2 doses all patients age 56-26 Dosing may be considered for patients over 26 Shingles Vaccine (Shingrix) 2 doses all adults 13 years and older Pneumonia (Pneumovax 23) All adults 16 years and older May recommend earlier dosing based on health history Pneumonia (Prevnar 40) All adults 75 years and older Dosed 1 year after Pneumovax 23  Additional Screening, Testing, and Vaccinations may be recommended on an individualized basis based on family history, health history, risk factors, and/or exposure.  __________________________________________________________  Diet Recommendations for All Patients  I recommend that all patients maintain a diet low in saturated fats, carbohydrates, and cholesterol. While this can be challenging at first, it is not impossible and small changes can make big differences.  Things to try: Decreasing the amount of soda, sweet tea, and/or juice to one or less per day and replace with water While water is always the first choice, if you do not like water you may consider adding a water additive without sugar to improve the taste other sugar free drinks Replace potatoes with a brightly colored vegetable at dinner Use healthy oils, such as canola oil or olive oil, instead of butter or hard margarine Limit your bread intake to two pieces or less a day Replace regular pasta with low carb pasta options Bake, broil, or grill foods instead of frying Monitor portion sizes  Eat  smaller, more frequent meals throughout the day instead of large meals  An important thing to remember is, if you love foods that are not great for your health, you don't have to give them up completely. Instead, allow these foods to be a reward when you have done well. Allowing yourself to still have special treats every once in a while is a nice way to tell yourself thank you for working hard to keep yourself healthy.   Also remember that every day is a new day. If you have a bad day and "fall off the wagon", you can still climb right back up and keep moving along on your journey!  We have resources available to help you!  Some websites that may be helpful include: www.http://carter.biz/  Www.VeryWellFit.com _____________________________________________________________  Activity Recommendations for All Patients  I recommend that all adults get at least 20 minutes of moderate physical  activity that elevates your heart rate at least 5 days out of the week.  Some examples include: Walking or jogging at a pace that allows you to carry on a conversation Cycling (stationary bike or outdoors) Water aerobics Yoga Weight lifting Dancing If physical limitations prevent you from putting stress on your joints, exercise in a pool or seated in a chair are excellent options.  Do determine your MAXIMUM heart rate for activity: YOUR AGE - 220 = MAX HeartRate   Remember! Do not push yourself too hard.  Start slowly and build up your pace, speed, weight, time in exercise, etc.  Allow your body to rest between exercise and get good sleep. You will need more water than normal when you are exerting yourself. Do not wait until you are thirsty to drink. Drink with a purpose of getting in at least 8, 8 ounce glasses of water a day plus more depending on how much you exercise and sweat.    If you begin to develop dizziness, chest pain, abdominal pain, jaw pain, shortness of breath, headache, vision changes,  lightheadedness, or other concerning symptoms, stop the activity and allow your body to rest. If your symptoms are severe, seek emergency evaluation immediately. If your symptoms are concerning, but not severe, please let us know so that we can recommend further evaluation.   ________________________________________________________________

## 2021-04-13 NOTE — Assessment & Plan Note (Signed)
Ongoing and worsening fatigue for months. Worsening since January of this year.  Concern for untreated hypothyroid contributing to symptoms. Other symptoms present make me question additional causes, as well. Etiology is not quite clear today.  No alarm symptoms present.  Will obtain labs

## 2021-04-13 NOTE — Assessment & Plan Note (Signed)
Dry eyes and dry mouth. Hx of hypothyroidism- unknown cause- no treatment.  Consider possible autoimmune component or reaction to untreated hypothyroidism.  At this time will obtain labs for further evaluation.  A1c 5.2% today.

## 2021-04-24 ENCOUNTER — Telehealth (HOSPITAL_BASED_OUTPATIENT_CLINIC_OR_DEPARTMENT_OTHER): Payer: Self-pay

## 2021-04-24 ENCOUNTER — Other Ambulatory Visit (HOSPITAL_BASED_OUTPATIENT_CLINIC_OR_DEPARTMENT_OTHER): Payer: Self-pay | Admitting: Nurse Practitioner

## 2021-04-24 DIAGNOSIS — H04123 Dry eye syndrome of bilateral lacrimal glands: Secondary | ICD-10-CM

## 2021-04-24 DIAGNOSIS — D72829 Elevated white blood cell count, unspecified: Secondary | ICD-10-CM

## 2021-04-24 DIAGNOSIS — R42 Dizziness and giddiness: Secondary | ICD-10-CM

## 2021-04-24 DIAGNOSIS — H539 Unspecified visual disturbance: Secondary | ICD-10-CM

## 2021-04-24 DIAGNOSIS — G4486 Cervicogenic headache: Secondary | ICD-10-CM

## 2021-04-24 DIAGNOSIS — R5382 Chronic fatigue, unspecified: Secondary | ICD-10-CM

## 2021-04-24 LAB — CBC WITH DIFFERENTIAL/PLATELET
Basophils Absolute: 0.2 10*3/uL (ref 0.0–0.2)
Basos: 1 %
EOS (ABSOLUTE): 0.7 10*3/uL — ABNORMAL HIGH (ref 0.0–0.4)
Eos: 6 %
Hematocrit: 43.7 % (ref 34.0–46.6)
Hemoglobin: 14.4 g/dL (ref 11.1–15.9)
Immature Grans (Abs): 0 10*3/uL (ref 0.0–0.1)
Immature Granulocytes: 0 %
Lymphocytes Absolute: 3 10*3/uL (ref 0.7–3.1)
Lymphs: 25 %
MCH: 28 pg (ref 26.6–33.0)
MCHC: 33 g/dL (ref 31.5–35.7)
MCV: 85 fL (ref 79–97)
Monocytes Absolute: 1 10*3/uL — ABNORMAL HIGH (ref 0.1–0.9)
Monocytes: 8 %
Neutrophils Absolute: 7.1 10*3/uL — ABNORMAL HIGH (ref 1.4–7.0)
Neutrophils: 60 %
Platelets: 409 10*3/uL (ref 150–450)
RBC: 5.15 x10E6/uL (ref 3.77–5.28)
RDW: 14 % (ref 11.7–15.4)
WBC: 12 10*3/uL — ABNORMAL HIGH (ref 3.4–10.8)

## 2021-04-24 LAB — LIPID PANEL
Chol/HDL Ratio: 4.4 ratio (ref 0.0–4.4)
Cholesterol, Total: 196 mg/dL (ref 100–199)
HDL: 45 mg/dL (ref >39)
LDL Chol Calc (NIH): 120 mg/dL — ABNORMAL HIGH (ref 0–99)
Triglycerides: 175 mg/dL — ABNORMAL HIGH (ref 0–149)
VLDL Cholesterol Cal: 31 mg/dL (ref 5–40)

## 2021-04-24 LAB — COMPREHENSIVE METABOLIC PANEL
ALT: 15 IU/L (ref 0–32)
AST: 22 IU/L (ref 0–40)
Albumin/Globulin Ratio: 1.4 (ref 1.2–2.2)
Albumin: 4.6 g/dL (ref 3.8–4.8)
Alkaline Phosphatase: 120 IU/L (ref 44–121)
BUN/Creatinine Ratio: 23 (ref 12–28)
BUN: 18 mg/dL (ref 8–27)
Bilirubin Total: 0.4 mg/dL (ref 0.0–1.2)
CO2: 24 mmol/L (ref 20–29)
Calcium: 9.7 mg/dL (ref 8.7–10.3)
Chloride: 103 mmol/L (ref 96–106)
Creatinine, Ser: 0.77 mg/dL (ref 0.57–1.00)
Globulin, Total: 3.3 g/dL (ref 1.5–4.5)
Glucose: 95 mg/dL (ref 65–99)
Potassium: 3.7 mmol/L (ref 3.5–5.2)
Sodium: 143 mmol/L (ref 134–144)
Total Protein: 7.9 g/dL (ref 6.0–8.5)
eGFR: 85 mL/min/{1.73_m2} (ref >59)

## 2021-04-24 LAB — THYROID PANEL WITH TSH
Free Thyroxine Index: 2 (ref 1.2–4.9)
T3 Uptake Ratio: 23 % — ABNORMAL LOW (ref 24–39)
T4, Total: 8.7 ug/dL (ref 4.5–12.0)
TSH: 3.8 u[IU]/mL (ref 0.450–4.500)

## 2021-04-24 LAB — RPR: RPR Ser Ql: NONREACTIVE

## 2021-04-24 LAB — ANA: ANA Titer 1: NEGATIVE

## 2021-04-24 LAB — C-REACTIVE PROTEIN: CRP: 7 mg/L (ref 0–10)

## 2021-04-24 LAB — HEPATITIS C ANTIBODY: Hep C Virus Ab: 0.1 s/co ratio (ref 0.0–0.9)

## 2021-04-24 LAB — VITAMIN D 25 HYDROXY (VIT D DEFICIENCY, FRACTURES): Vit D, 25-Hydroxy: 23.5 ng/mL — ABNORMAL LOW (ref 30.0–100.0)

## 2021-04-24 NOTE — Progress Notes (Signed)
Please call patient.   White blood counts are elevated and looking back at labs from 5 months ago- they were also elevated at that time. It isn't clear if this could be causing the dizziness, but I am concerned that there is an underlying cause that we haven't determined yet causing these chronic higher levels. I strongly recommend that we send to hematology/oncology for further evaluation especially int he setting of dizziness and altered gait. I have sent the referral.   Inflammatory markers normal, ANA normal, thyroid normal.  Vitamin D levels are slightly low. Recommend vitamin d3 supplement 800-1000iu per day for treatment. Can be purchased over the counter at the pharmacy.   Triglycerides are elevated. Recommend reducing fatty and fried foods from diet and we will recheck this in about 3 months.   If she is agreeable,

## 2021-04-24 NOTE — Telephone Encounter (Signed)
-----   Message from Tollie Eth, NP sent at 04/24/2021  1:51 PM EDT ----- Please call patient.   White blood counts are elevated and looking back at labs from 5 months ago- they were also elevated at that time. It isn't clear if this could be causing the dizziness, but I am concerned that there is an underlying cause that we haven't determined yet causing these chronic higher levels. I strongly recommend that we send to hematology/oncology for further evaluation especially int he setting of dizziness and altered gait. I have sent the referral.   Inflammatory markers normal, ANA normal, thyroid normal.  Vitamin D levels are slightly low. Recommend vitamin d3 supplement 800-1000iu per day for treatment. Can be purchased over the counter at the pharmacy.   Triglycerides are elevated. Recommend reducing fatty and fried foods from diet and we will recheck this in about 3 months.   If she is agreeable,

## 2021-04-24 NOTE — Telephone Encounter (Signed)
Results released by [name], AGNP.  Called patient to discuss lab results and recommendations.  She is aware and understands.  Patient scheduled a  nurse visit on 07/26/21 @ 8:20 am for lipid labs.  Instructed patient to contact the office with any questions or concerns.

## 2021-04-27 ENCOUNTER — Telehealth: Payer: Self-pay | Admitting: Oncology

## 2021-04-27 ENCOUNTER — Other Ambulatory Visit: Payer: Self-pay | Admitting: *Deleted

## 2021-04-27 DIAGNOSIS — D72829 Elevated white blood cell count, unspecified: Secondary | ICD-10-CM

## 2021-04-27 NOTE — Telephone Encounter (Signed)
Scheduled appt per referral. Pt aware.  

## 2021-05-04 ENCOUNTER — Encounter (HOSPITAL_BASED_OUTPATIENT_CLINIC_OR_DEPARTMENT_OTHER): Payer: Self-pay | Admitting: Nurse Practitioner

## 2021-05-04 ENCOUNTER — Ambulatory Visit (INDEPENDENT_AMBULATORY_CARE_PROVIDER_SITE_OTHER): Payer: Medicare Other | Admitting: Nurse Practitioner

## 2021-05-04 ENCOUNTER — Other Ambulatory Visit (HOSPITAL_COMMUNITY): Payer: Self-pay

## 2021-05-04 ENCOUNTER — Other Ambulatory Visit: Payer: Self-pay

## 2021-05-04 VITALS — BP 147/91 | HR 80 | Ht 63.5 in | Wt 182.0 lb

## 2021-05-04 DIAGNOSIS — G43901 Migraine, unspecified, not intractable, with status migrainosus: Secondary | ICD-10-CM

## 2021-05-04 DIAGNOSIS — K047 Periapical abscess without sinus: Secondary | ICD-10-CM

## 2021-05-04 DIAGNOSIS — M47812 Spondylosis without myelopathy or radiculopathy, cervical region: Secondary | ICD-10-CM

## 2021-05-04 DIAGNOSIS — K122 Cellulitis and abscess of mouth: Secondary | ICD-10-CM

## 2021-05-04 DIAGNOSIS — R52 Pain, unspecified: Secondary | ICD-10-CM

## 2021-05-04 HISTORY — DX: Periapical abscess without sinus: K04.7

## 2021-05-04 HISTORY — DX: Spondylosis without myelopathy or radiculopathy, cervical region: M47.812

## 2021-05-04 HISTORY — DX: Migraine, unspecified, not intractable, with status migrainosus: G43.901

## 2021-05-04 MED ORDER — KETOROLAC TROMETHAMINE 60 MG/2ML IM SOLN
60.0000 mg | Freq: Once | INTRAMUSCULAR | Status: AC
Start: 2021-05-04 — End: 2021-05-04
  Administered 2021-05-04: 60 mg via INTRAMUSCULAR

## 2021-05-04 MED ORDER — DEXAMETHASONE SODIUM PHOSPHATE 10 MG/ML IJ SOLN
10.0000 mg | Freq: Once | INTRAMUSCULAR | Status: AC
  Administered 2021-05-04: 10 mg via INTRAMUSCULAR

## 2021-05-04 MED ORDER — CYCLOBENZAPRINE HCL 5 MG PO TABS
5.0000 mg | ORAL_TABLET | Freq: Three times a day (TID) | ORAL | 2 refills | Status: DC | PRN
  Filled 2021-05-04: qty 30, 10d supply, fill #0
  Filled 2021-05-21: qty 30, 10d supply, fill #1
  Filled 2021-06-05: qty 30, 10d supply, fill #2

## 2021-05-04 MED ORDER — SUMATRIPTAN SUCCINATE 50 MG PO TABS
ORAL_TABLET | ORAL | 6 refills | Status: DC
  Filled 2021-05-04: qty 12, 30d supply, fill #0

## 2021-05-04 MED ORDER — AMOXICILLIN-POT CLAVULANATE 875-125 MG PO TABS
1.0000 | ORAL_TABLET | Freq: Two times a day (BID) | ORAL | 0 refills | Status: DC
  Filled 2021-05-04: qty 10, 5d supply, fill #0

## 2021-05-04 MED ORDER — MELOXICAM 15 MG PO TABS
15.0000 mg | ORAL_TABLET | Freq: Every day | ORAL | 3 refills | Status: DC
Start: 2021-05-04 — End: 2021-08-28
  Filled 2021-05-04: qty 30, 30d supply, fill #0
  Filled 2021-07-25: qty 30, 30d supply, fill #1

## 2021-05-04 NOTE — Assessment & Plan Note (Signed)
Symptoms and presentation consistent with infection to the left gum-line.  Will treat with oral antibiotics- no red flags or systemic symptoms present today Possible the tooth pain is contributing to headache Recommend F/U if symptoms do not improve

## 2021-05-04 NOTE — Patient Instructions (Addendum)
I would like you to take the muscle relaxer and do the neck stretches and exercises at least once a day.  Use a heat pack on the neck and shoulders before and after doing the stretches to help loosen these muscles.  Take meloxicam once a day- do not take other ibuprofen medicines with this as this is an NSAID.   You can take the sumatriptan at the first sign of a migraine to help with the symptoms.  It is best to lay down after taking this to help it work best.   I have sent in an antibiotic for the oral infection.  If this does not help, please call and let me know.   If your symptoms change at all, please let me know immediately.

## 2021-05-04 NOTE — Progress Notes (Signed)
Established Patient Office Visit  Subjective:  Patient ID: Julia Gutierrez, female    DOB: 07-16-1956  Age: 65 y.o. MRN: [id number]  CC:  Chief Complaint  Patient presents with   Follow-up   Headache    HPI [name] presents for chronic migraine headache. [name] reports that she has been experiencing increased frequency of headaches since having Bells Palsy in January. She reports that the headaches are located in the top of the head and can vary from mild to full migraine.  She endorses sensitivity to light and sound and dizziness.  She is not sure if the dizziness is independent of the headaches, as this is something that has also been going on since Bells Palsy.  She reports on Monday she started to get a migraine and took tylenol and ibuprofen with little effect. She states that by Tuesday she could not do anything and laid in bed all day due to headache.  She does feel that she has an infection in her gum line on the left side and is not sure if that could be contributing to the headaches.  She reports the headache feels sharp in sensation when first starting and will then progress.  She states that she has a "stiff neck" that is chronic and she is not sure if this is part of the headache cause.  She does endorse intermittent nausea- no vomiting, fevers, vision loss, weakness, speech difficulties, swallowing difficulties, memory issues.  She states her vision has been "wonky" since having Bells Palsy, but it is not changing.  She has tried biofreeze and salon paas on her neck with no relief.   She tells me she feels that the headaches may be stemming from both her neck and her tooth infection.   Past Medical History:  Diagnosis Date   Hypertension    Hypothyroidism     History reviewed. No pertinent surgical history.  Family History  Problem Relation Age of Onset   Diabetes Mother    Hypertension Mother    Hypertension Brother     Social History   Socioeconomic  History   Marital status: Divorced    Spouse name: Not on file   Number of children: 3   Years of education: 56   Highest education level: Master's degree (e.g., MA, MS, MEng, MEd, MSW, MBA)  Occupational History   Occupation: Human resources officer  Tobacco Use   Smoking status: Never   Smokeless tobacco: Never  Vaping Use   Vaping Use: Never used  Substance and Sexual Activity   Alcohol use: Yes    Alcohol/week: 1.0 standard drink    Types: 1 Glasses of wine per week    Comment: Glass of wine once a year   Drug use: Never   Sexual activity: Not on file  Other Topics Concern   Not on file  Social History Narrative   Not on file   Social Determinants of Health   Financial Resource Strain: Not on file  Food Insecurity: Not on file  Transportation Needs: Not on file  Physical Activity: Not on file  Stress: Not on file  Social Connections: Not on file  Intimate Partner Violence: Not on file    Outpatient Medications Prior to Visit  Medication Sig Dispense Refill   amLODipine (NORVASC) 5 MG tablet TAKE 1 TABLET BY MOUTH ONCE DAILY. 90 tablet 3   aspirin 325 MG tablet Take 325 mg by mouth daily.     atenolol (TENORMIN) 50 MG tablet Take by mouth.  hydrochlorothiazide (HYDRODIURIL) 25 MG tablet TAKE 1 TABLET BY MOUTH ONCE DAILY. 90 tablet 3   ibuprofen (ADVIL) 800 MG tablet Take 800 mg by mouth every 8 (eight) hours as needed.     Multiple Vitamins-Minerals (MULTIVITAMIN WITH MINERALS) tablet Take 1 tablet by mouth daily.     amLODipine (NORVASC) 10 MG tablet Take by mouth.     No facility-administered medications prior to visit.    Allergies  Allergen Reactions   Promethazine Other (See Comments)    Muscle spasms Other reaction(s): Other (See Comments) Muscle spasms Muscle spasms    ROS Review of Systems All review of systems negative except what is listed in the HPI    Objective:    Physical Exam Vitals and nursing note reviewed.  Constitutional:       Appearance: She is well-developed.  HENT:     Head: Normocephalic and atraumatic.     Jaw: There is normal jaw occlusion. Tenderness and pain on movement present.      Right Ear: Hearing normal.     Left Ear: Hearing normal.     Nose: Nose normal.     Right Sinus: No maxillary sinus tenderness or frontal sinus tenderness.     Left Sinus: No maxillary sinus tenderness or frontal sinus tenderness.     Mouth/Throat:     Mouth: Mucous membranes are moist.     Dentition: Abnormal dentition. Dental tenderness and gingival swelling present.     Tongue: No lesions. Tongue does not deviate from midline.     Palate: No mass and lesions.     Pharynx: Oropharynx is clear. Uvula midline.  Eyes:     General: No visual field deficit or scleral icterus.    Extraocular Movements: Extraocular movements intact.     Right eye: Nystagmus present. Normal extraocular motion.     Left eye: Normal extraocular motion and no nystagmus.     Pupils: Pupils are equal, round, and reactive to light. Pupils are equal.     Right eye: Pupil is round and reactive.     Left eye: Pupil is round and reactive.  Neck:     Thyroid: No thyroid mass, thyromegaly or thyroid tenderness.     Vascular: No carotid bruit.     Trachea: Trachea and phonation normal.     Meningeal: Brudzinski's sign absent.      Comments: No tenderness or abnormalities to skull noted on palpation.  Cervical pain with lateral movement both left and right with increased head pain.  No weakness or pain with horizontal movement.  Cardiovascular:     Rate and Rhythm: Normal rate and regular rhythm.     Heart sounds: Normal heart sounds.  Pulmonary:     Effort: Pulmonary effort is normal.     Breath sounds: Normal breath sounds.  Abdominal:     General: Bowel sounds are normal.     Palpations: Abdomen is soft.  Musculoskeletal:     Cervical back: No edema, erythema, rigidity or crepitus. Pain with movement and muscular tenderness present. No  spinous process tenderness. Decreased range of motion.  Lymphadenopathy:     Cervical: No cervical adenopathy.  Skin:    General: Skin is warm and dry.     Capillary Refill: Capillary refill takes less than 2 seconds.  Neurological:     Mental Status: She is alert and oriented to person, place, and time.     GCS: GCS eye subscore is 4. GCS verbal subscore is 5. GCS motor  subscore is 6.     Cranial Nerves: No cranial nerve deficit, dysarthria or facial asymmetry.     Sensory: No sensory deficit.     Motor: No weakness.     Coordination: Romberg sign positive. Coordination normal. Rapid alternating movements normal.     Gait: Gait normal.     Deep Tendon Reflexes: Reflexes normal. Babinski sign absent on the right side. Babinski sign absent on the left side.  Psychiatric:        Mood and Affect: Mood normal.        Speech: Speech normal.        Behavior: Behavior normal.    BP (!) 147/91   Pulse 80   Ht 5' 3.5" (1.613 m)   Wt 182 lb (82.6 kg)   SpO2 100%   BMI 31.73 kg/m  Wt Readings from Last 3 Encounters:  05/04/21 182 lb (82.6 kg)  04/13/21 181 lb 3.2 oz (82.2 kg)  10/30/20 185 lb (83.9 kg)     Health Maintenance Due  Topic Date Due   TETANUS/TDAP  Never done   COLONOSCOPY (Pts 45-41yr Insurance coverage will need to be confirmed)  Never done   MAMMOGRAM  Never done   Zoster Vaccines- Shingrix (1 of 2) Never done   DEXA SCAN  Never done   PNA vac Low Risk Adult (1 of 2 - PCV13) Never done   COVID-19 Vaccine (2 - Moderna series) 10/01/2020    There are no preventive care reminders to display for this patient.  Lab Results  Component Value Date   TSH 3.800 04/13/2021   Lab Results  Component Value Date   WBC 12.0 (H) 04/13/2021   HGB 14.4 04/13/2021   HCT 43.7 04/13/2021   MCV 85 04/13/2021   PLT 409 04/13/2021   Lab Results  Component Value Date   NA 143 04/13/2021   K 3.7 04/13/2021   CO2 24 04/13/2021   GLUCOSE 95 04/13/2021   BUN 18 04/13/2021    CREATININE 0.77 04/13/2021   BILITOT 0.4 04/13/2021   ALKPHOS 120 04/13/2021   AST 22 04/13/2021   ALT 15 04/13/2021   PROT 7.9 04/13/2021   ALBUMIN 4.6 04/13/2021   CALCIUM 9.7 04/13/2021   ANIONGAP 14 10/30/2020   EGFR 85 04/13/2021   Lab Results  Component Value Date   CHOL 196 04/13/2021   Lab Results  Component Value Date   HDL 45 04/13/2021   Lab Results  Component Value Date   LDLCALC 120 (H) 04/13/2021   Lab Results  Component Value Date   TRIG 175 (H) 04/13/2021   Lab Results  Component Value Date   CHOLHDL 4.4 04/13/2021   Lab Results  Component Value Date   HGBA1C 5.2 04/13/2021      Assessment & Plan:   Problem List Items Addressed This Visit     Migraine with status migrainosus, not intractable - Primary    Migraine headache on and off not responding to OTC treatment It appears there may be more that a single modality contributing to her headache at this time.  Neuro exam unremarkable with exception of right eye horizontal nystagmus with movement- consistent with inner ear etiology- possible effect from Bells Palsy.  Strength and ROM equal bilaterally. No cranial nerve deficit Suspect that cervical muscle tenderness is contributing to headache symptoms.  Will provide migraine coctail today to stop headache cycle and reduce cervical inflammation.  Review of CT from January unremarkable Alarm signs discussed FU if new  symptoms develop or if symptoms fail to improve       Relevant Medications   atenolol (TENORMIN) 50 MG tablet   cyclobenzaprine (FLEXERIL) 5 MG tablet   meloxicam (MOBIC) 15 MG tablet   SUMAtriptan (IMITREX) 50 MG tablet   Oral infection    Symptoms and presentation consistent with infection to the left gum-line.  Will treat with oral antibiotics- no red flags or systemic symptoms present today Possible the tooth pain is contributing to headache Recommend F/U if symptoms do not improve       Relevant Medications    amoxicillin-clavulanate (AUGMENTIN) 875-125 MG tablet   Cervical spondylosis    Cervical tenderness with decreased ROM and muscle spasms Likely contributing to daily headache Recommend heat, stretches, muscle relaxer, and anti-inflammatory medication to help reduce pain and increase ROM No red flags present.  CT from January non revealing Migraine cocktail provided today with hopes medication will also reduce inflammation in cervical muscles.  Alarm signs discussed.  Recommend f/u if symptoms worsen or fail to improve.        Relevant Medications   cyclobenzaprine (FLEXERIL) 5 MG tablet   meloxicam (MOBIC) 15 MG tablet    Meds ordered this encounter  Medications   ketorolac (TORADOL) injection 60 mg   dexamethasone (DECADRON) injection 10 mg   cyclobenzaprine (FLEXERIL) 5 MG tablet    Sig: Take 1 tablet (5 mg total) by mouth 3 (three) times daily as needed for muscle spasms.    Dispense:  30 tablet    Refill:  2   amoxicillin-clavulanate (AUGMENTIN) 875-125 MG tablet    Sig: Take 1 tablet by mouth 2 (two) times daily.    Dispense:  10 tablet    Refill:  0   meloxicam (MOBIC) 15 MG tablet    Sig: Take 1 tablet (15 mg total) by mouth daily.    Dispense:  30 tablet    Refill:  3   SUMAtriptan (IMITREX) 50 MG tablet    Sig: Take 1 tablet by mouth at first sign of migraine. May repeat in 2 hours if headache persists or recurs.    Dispense:  12 tablet    Refill:  6    Follow-up: Return in about 4 weeks (around 06/01/2021) for headaches- neck pain.    Orma Render, NP

## 2021-05-04 NOTE — Assessment & Plan Note (Signed)
Migraine headache on and off not responding to OTC treatment It appears there may be more that a single modality contributing to her headache at this time.  Neuro exam unremarkable with exception of right eye horizontal nystagmus with movement- consistent with inner ear etiology- possible effect from Bells Palsy.  Strength and ROM equal bilaterally. No cranial nerve deficit Suspect that cervical muscle tenderness is contributing to headache symptoms.  Will provide migraine coctail today to stop headache cycle and reduce cervical inflammation.  Review of CT from January unremarkable Alarm signs discussed FU if new symptoms develop or if symptoms fail to improve

## 2021-05-04 NOTE — Addendum Note (Signed)
Addended by: Mica Releford, Huntley Dec E on: 05/04/2021 01:17 PM   Modules accepted: Orders

## 2021-05-04 NOTE — Assessment & Plan Note (Signed)
Cervical tenderness with decreased ROM and muscle spasms Likely contributing to daily headache Recommend heat, stretches, muscle relaxer, and anti-inflammatory medication to help reduce pain and increase ROM No red flags present.  CT from January non revealing Migraine cocktail provided today with hopes medication will also reduce inflammation in cervical muscles.  Alarm signs discussed.  Recommend f/u if symptoms worsen or fail to improve.

## 2021-05-21 ENCOUNTER — Other Ambulatory Visit (HOSPITAL_COMMUNITY): Payer: Self-pay

## 2021-05-21 ENCOUNTER — Other Ambulatory Visit: Payer: Self-pay

## 2021-05-21 ENCOUNTER — Inpatient Hospital Stay: Payer: Medicare Other | Attending: Oncology

## 2021-05-21 ENCOUNTER — Telehealth: Payer: Self-pay | Admitting: *Deleted

## 2021-05-21 DIAGNOSIS — D72829 Elevated white blood cell count, unspecified: Secondary | ICD-10-CM | POA: Diagnosis not present

## 2021-05-21 LAB — CBC WITH DIFFERENTIAL (CANCER CENTER ONLY)
Abs Immature Granulocytes: 0.04 10*3/uL (ref 0.00–0.07)
Basophils Absolute: 0.1 10*3/uL (ref 0.0–0.1)
Basophils Relative: 1 %
Eosinophils Absolute: 0.7 10*3/uL — ABNORMAL HIGH (ref 0.0–0.5)
Eosinophils Relative: 6 %
HCT: 41.9 % (ref 36.0–46.0)
Hemoglobin: 13.7 g/dL (ref 12.0–15.0)
Immature Granulocytes: 0 %
Lymphocytes Relative: 29 %
Lymphs Abs: 3.7 10*3/uL (ref 0.7–4.0)
MCH: 27.8 pg (ref 26.0–34.0)
MCHC: 32.7 g/dL (ref 30.0–36.0)
MCV: 85.2 fL (ref 80.0–100.0)
Monocytes Absolute: 1.2 10*3/uL — ABNORMAL HIGH (ref 0.1–1.0)
Monocytes Relative: 9 %
Neutro Abs: 6.9 10*3/uL (ref 1.7–7.7)
Neutrophils Relative %: 55 %
Platelet Count: 415 10*3/uL — ABNORMAL HIGH (ref 150–400)
RBC: 4.92 MIL/uL (ref 3.87–5.11)
RDW: 15.2 % (ref 11.5–15.5)
WBC Count: 12.7 10*3/uL — ABNORMAL HIGH (ref 4.0–10.5)
nRBC: 0 % (ref 0.0–0.2)

## 2021-05-21 LAB — SAVE SMEAR(SSMR), FOR PROVIDER SLIDE REVIEW

## 2021-05-21 NOTE — Telephone Encounter (Signed)
Left VM for patient w/reminder of lab appointment today at 1 pm and OV 8/16 at 1:40 pm and to arrive 15 minutes early. Noted address, parking, visitor and mask policy and to bring ID and insurance cards to appointment.

## 2021-05-22 ENCOUNTER — Other Ambulatory Visit: Payer: Self-pay

## 2021-05-22 ENCOUNTER — Inpatient Hospital Stay (HOSPITAL_BASED_OUTPATIENT_CLINIC_OR_DEPARTMENT_OTHER): Payer: Medicare Other | Admitting: Oncology

## 2021-05-22 VITALS — BP 148/95 | HR 90 | Temp 98.1°F | Resp 20 | Ht 63.0 in | Wt 183.0 lb

## 2021-05-22 DIAGNOSIS — R42 Dizziness and giddiness: Secondary | ICD-10-CM

## 2021-05-22 DIAGNOSIS — D72829 Elevated white blood cell count, unspecified: Secondary | ICD-10-CM | POA: Diagnosis not present

## 2021-05-22 DIAGNOSIS — I10 Essential (primary) hypertension: Secondary | ICD-10-CM

## 2021-05-22 NOTE — Progress Notes (Signed)
[facility] New Patient Consult   Requesting MD: Tollie Eth, [address] [place],  [place] [address]   Julia Gutierrez 65 y.o.  December 01, 1955    Reason for Consult: Leukocytosis   HPI: Julia Gutierrez relocated to [place] from [place] in November 2021.  She reports a history of mild leukocytosis for several years while living in [place] with repeat CBCs confirming a white count of approximately 12,000 (we do not have these records available today). She established with family medicine for primary care in July.  A CBC on 04/13/2021 found the white count at 12, ANC 7.1, absolute lymphocyte count 3.0, and absolute monocyte count 1.0.  The hemoglobin turned at 14.4 over the platelet count of 409,000.  She was seen at [facility] urgent care on 10/26/2020.  She had symptoms of a sinus infection and acute otitis externa of the right ear.  She was treated with Augmentin and a Medrol Dosepak.  She also had vertigo.  She represented on 10/30/2020 with dizziness and right-sided headache.  She had a right facial droop.  She was referred to the emergency room and the CBC was remarkable for white count of 19.1, hemoglobin 14.5, platelets 190,000, and absolute neutrophil count of 14.  A CT brain revealed no acute change.  She was diagnosed with Bell's palsy.  She reports the facial droop resolved.  She continues to have intermittent dizziness and a gait disturbance.  She was treated with Decadron on 05/04/2021 for a migraine headache.  The headache improved.  No recent infection or fever.  Past Medical History:  Diagnosis Date   Hypertension    Hypothyroidism     .  G4 P3, 1 miscarriage  Past surgical history: Cholecystectomy in 2003  Medications: Reviewed  Allergies:  Allergies  Allergen Reactions   Promethazine Other (See Comments)    Muscle spasms Other reaction(s): Other (See Comments) Muscle spasms Muscle spasms    Family history: No family history of  cancer or hematologic disorder  Social History:   She lives alone in [place].  Her daughter lives in [place].  She has worked in Insurance account manager.  She does not use cigarettes.  She drinks 1/2 glass of wine twice per year.  No transfusion history.  No risk factor for HIV or hepatitis.  She has received COVID vaccines  ROS:   Positives include: Recent tooth infection treated with Augmentin, throat clearing with increased "phlegm "in the mornings, migraine headache a few weeks ago, intermittent dizziness and unsteady gait since having Bell's palsy January 22  A complete ROS was otherwise negative.  Physical Exam:  Blood pressure (!) 148/95, pulse 90, temperature 98.1 F (36.7 C), temperature source Oral, resp. rate 20, height 5\' 3"  (1.6 m), weight 183 lb (83 kg), SpO2 100 %.  HEENT: Neck without mass Lungs: Clear bilaterally Cardiac: Regular rate and rhythm Abdomen: No hepatosplenomegaly  Vascular: No leg edema Lymph nodes: No cervical, supraclavicular, axillary, or inguinal nodes Neurologic: Alert and oriented, the motor exam appears intact in the upper and lower extremities bilaterally.  She ambulated to the exam table without difficulty. Skin: No rash, multiple scars at the forearm bilaterally Musculoskeletal: Mild tenderness at the low posterior cervical/thoracic spine, no joint erythema or swelling, mild discomfort with abduction at the left shoulder   LAB:  CBC  Lab Results  Component Value Date   WBC 12.7 (H) 05/21/2021   HGB 13.7 05/21/2021   HCT 41.9 05/21/2021   MCV 85.2 05/21/2021  PLT 415 (H) 05/21/2021   NEUTROABS 6.9 05/21/2021    Blood smear: The white cell morphology is unremarkable, no blasts or other young forms are present.  No monotonous white cell population.  The platelets appear normal in number, I saw 1 small platelet clumps.  Red cell morphology is unremarkable.  The polychromasia is not increased.    CMP  Lab Results  Component Value Date   NA  143 04/13/2021   K 3.7 04/13/2021   CL 103 04/13/2021   CO2 24 04/13/2021   GLUCOSE 95 04/13/2021   BUN 18 04/13/2021   CREATININE 0.77 04/13/2021   CALCIUM 9.7 04/13/2021   PROT 7.9 04/13/2021   ALBUMIN 4.6 04/13/2021   AST 22 04/13/2021   ALT 15 04/13/2021   ALKPHOS 120 04/13/2021   BILITOT 0.4 04/13/2021   GFRNONAA >60 10/30/2020       Assessment/Plan:   Leukocytosis  Bell's palsy January 22 Hypertension "Dizziness "-intermittent since January 22   Disposition:   Julia Gutierrez is referred for evaluation of leukocytosis.  She reports chronic mild leukocytosis leukocytosis dating to while living in [place].  It is likely the more elevated white count in January of this year was related to steroid therapy.  Is also possible the elevated white count today is related to the Decadron injection a few weeks ago.  I have a low clinical suspicion for a primary hematologic condition such as a chronic leukemia or lymphoma.  There is no apparent systemic inflammatory condition, but this is possible.  She had a negative CRP and ANA last month.     I do not recommend additional diagnostic evaluation today.  She will obtain a copy of her CBC data from [place] and forward this to [place].  She will return for an office visit and CBC in 4 months.  She will follow-up with Huntley Dec Early in the interim.  We will perform additional diagnostic evaluation if she develops a progressive leukocytosis or other findings to suggest a hematologic condition.    Thornton Papas, MD  05/22/2021, 1:48 PM

## 2021-05-23 ENCOUNTER — Other Ambulatory Visit: Payer: Medicare Other

## 2021-05-24 ENCOUNTER — Ambulatory Visit: Payer: Medicare Other | Admitting: Oncology

## 2021-05-28 ENCOUNTER — Encounter (HOSPITAL_BASED_OUTPATIENT_CLINIC_OR_DEPARTMENT_OTHER): Payer: Self-pay | Admitting: Nurse Practitioner

## 2021-05-28 ENCOUNTER — Other Ambulatory Visit: Payer: Self-pay

## 2021-05-28 ENCOUNTER — Other Ambulatory Visit (HOSPITAL_COMMUNITY): Payer: Self-pay

## 2021-05-28 ENCOUNTER — Ambulatory Visit (INDEPENDENT_AMBULATORY_CARE_PROVIDER_SITE_OTHER): Payer: Medicare Other | Admitting: Nurse Practitioner

## 2021-05-28 VITALS — BP 134/82 | HR 101 | Ht 63.0 in | Wt 183.0 lb

## 2021-05-28 DIAGNOSIS — B0221 Postherpetic geniculate ganglionitis: Secondary | ICD-10-CM

## 2021-05-28 DIAGNOSIS — K122 Cellulitis and abscess of mouth: Secondary | ICD-10-CM

## 2021-05-28 DIAGNOSIS — G4486 Cervicogenic headache: Secondary | ICD-10-CM

## 2021-05-28 DIAGNOSIS — H8113 Benign paroxysmal vertigo, bilateral: Secondary | ICD-10-CM | POA: Insufficient documentation

## 2021-05-28 HISTORY — DX: Postherpetic geniculate ganglionitis: B02.21

## 2021-05-28 MED ORDER — AMOXICILLIN-POT CLAVULANATE 875-125 MG PO TABS
1.0000 | ORAL_TABLET | Freq: Two times a day (BID) | ORAL | 0 refills | Status: DC
  Filled 2021-05-28: qty 10, 5d supply, fill #0

## 2021-05-28 MED ORDER — GABAPENTIN 100 MG PO CAPS
ORAL_CAPSULE | ORAL | 3 refills | Status: DC
  Filled 2021-05-28: qty 150, 30d supply, fill #0
  Filled 2021-07-25: qty 150, 30d supply, fill #1
  Filled 2021-09-11: qty 150, 30d supply, fill #2
  Filled 2021-10-31 – 2021-11-09 (×2): qty 150, 30d supply, fill #3

## 2021-05-28 MED ORDER — PREDNISONE 10 MG PO TABS
ORAL_TABLET | ORAL | 0 refills | Status: DC
  Filled 2021-05-28: qty 42, 12d supply, fill #0

## 2021-05-28 NOTE — Progress Notes (Signed)
Established Patient Office Visit  Subjective:  Patient ID: Julia Gutierrez, female    DOB: [birth year]  Age: 65 y.o. MRN: [id number]  CC:  Chief Complaint  Patient presents with   Dizziness    Patient states she is still dizzy from when had Bell Palsy in January.  Nothing makes it better or worse.  Patient states her neck is stiff.    HPI Julia Gutierrez presents for ongoing dizziness.  Dizziness Symptom onset in January of this year Experienced right-sided facial droop, dysarthria, pain/tenderness/rash to the right ear Seen in the emergency room and diagnosed with Bell's palsy and provided with valacyclovir and prednisone taper for treatment Since that time facial droop and rash have resolved however she has been battling ongoing vertigo symptoms She did have improvement of symptoms with steroid injection and Toradol which was given at last visit however her symptoms returned She has tried Epley maneuver at home which does help somewhat for her symptoms but it is not prolonged She continues to experience stiffness in her neck, dizziness, and difficulty walking She expresses frustration over the ongoing symptoms She also tells me that she has been experiencing hearing loss since this all began She has also been experiencing some pain in the occipital region on the right-hand side of her head and some pain behind her right ear. She denies any fever, chills, rashes, sinus pain or pressure, dysarthria, dysphagia, vision changes.  Tooth infection She was last seen about a month ago with an infection in one of her left lower molars. She reports that the symptoms have improved greatly however she has noticed recently that there appears to be a little more tenderness She is not having any fever, chills, purulent drainage, foul odor from her mouth She tells me she is not certain all the infection went away with the first round of antibiotics   Past Medical History:  Diagnosis Date    Cervical spondylosis 05/04/2021   Chronic fatigue 04/13/2021   Encounter to establish care 04/13/2021   Hypertension    Hypothyroidism    Increased thirst 04/13/2021   Mouth dryness 04/13/2021   Palpitations 04/13/2021   Vertigo 04/13/2021    Outpatient Medications Prior to Visit  Medication Sig Dispense Refill   acetaminophen (TYLENOL) 325 MG tablet Take 650 mg by mouth every 6 (six) hours as needed.     amLODipine (NORVASC) 5 MG tablet TAKE 1 TABLET BY MOUTH ONCE DAILY. 90 tablet 3   aspirin 325 MG tablet Take 325 mg by mouth daily.     cyclobenzaprine (FLEXERIL) 5 MG tablet Take 1 tablet (5 mg total) by mouth 3 (three) times daily as needed for muscle spasms. 30 tablet 2   hydrochlorothiazide (HYDRODIURIL) 25 MG tablet TAKE 1 TABLET BY MOUTH ONCE DAILY. 90 tablet 3   ibuprofen (ADVIL) 800 MG tablet Take 800 mg by mouth every 8 (eight) hours as needed. (Patient not taking: Reported on 05/22/2021)     meloxicam (MOBIC) 15 MG tablet Take 1 tablet (15 mg total) by mouth daily. 30 tablet 3   Multiple Vitamins-Minerals (MULTIVITAMIN WITH MINERALS) tablet Take 1 tablet by mouth daily.     SUMAtriptan (IMITREX) 50 MG tablet Take 1 tablet by mouth at first sign of migraine. May repeat in 2 hours if headache persists or recurs. 12 tablet 6   No facility-administered medications prior to visit.    Allergies  Allergen Reactions   Promethazine Other (See Comments)    Muscle spasms Other reaction(s): Other (See  Comments) Muscle spasms Muscle spasms    ROS Review of Systems All review of systems negative except what is listed in the HPI    Objective:    Physical Exam Vitals and nursing note reviewed.  Constitutional:      General: She is not in acute distress.    Appearance: Normal appearance.  HENT:     Head: Normocephalic and atraumatic.     Jaw: There is normal jaw occlusion. Tenderness and pain on movement present.     Right Ear: Tympanic membrane, ear canal and external ear normal.  Decreased hearing noted.     Left Ear: Tympanic membrane, ear canal and external ear normal.     Nose: Nose normal.     Mouth/Throat:     Lips: Pink.     Mouth: Mucous membranes are moist.     Dentition: Gingival swelling present.     Tongue: No lesions. Tongue does not deviate from midline.     Palate: No mass and lesions.     Pharynx: Oropharynx is clear. Uvula midline.  Eyes:     General: Lids are normal. No visual field deficit.    Extraocular Movements:     Right eye: Nystagmus present.     Left eye: Nystagmus present.     Conjunctiva/sclera: Conjunctivae normal.  Cardiovascular:     Rate and Rhythm: Normal rate and regular rhythm.     Pulses: Normal pulses.     Heart sounds: Normal heart sounds.  Pulmonary:     Effort: Pulmonary effort is normal.     Breath sounds: Normal breath sounds.  Musculoskeletal:        General: Tenderness present.     Cervical back: Tenderness present. Pain with movement and muscular tenderness present. No spinous process tenderness. Decreased range of motion.     Right lower leg: No edema.     Left lower leg: No edema.  Skin:    General: Skin is warm and dry.     Capillary Refill: Capillary refill takes less than 2 seconds.  Neurological:     General: No focal deficit present.     Mental Status: She is alert and oriented to person, place, and time.     Cranial Nerves: No cranial nerve deficit.     Sensory: No sensory deficit.     Motor: No weakness.     Coordination: Coordination abnormal.     Gait: Gait normal.     Deep Tendon Reflexes: Reflexes normal.  Psychiatric:        Mood and Affect: Mood normal.        Behavior: Behavior normal.        Thought Content: Thought content normal.        Judgment: Judgment normal.    Nystagmus to the left  BP 134/82   Pulse (!) 101   Ht 5' 3"  (1.6 m)   Wt 183 lb (83 kg)   SpO2 100%   BMI 32.42 kg/m  Wt Readings from Last 3 Encounters:  05/28/21 183 lb (83 kg)  05/22/21 183 lb (83 kg)   05/04/21 182 lb (82.6 kg)     Health Maintenance Due  Topic Date Due   TETANUS/TDAP  Never done   COLONOSCOPY (Pts 45-35yr Insurance coverage will need to be confirmed)  Never done   MAMMOGRAM  Never done   Zoster Vaccines- Shingrix (1 of 2) Never done   DEXA SCAN  Never done   PNA vac Low Risk Adult (1  of 2 - PCV13) Never done   COVID-19 Vaccine (2 - Moderna series) 10/01/2020    There are no preventive care reminders to display for this patient.  Lab Results  Component Value Date   TSH 3.800 04/13/2021   Lab Results  Component Value Date   WBC 12.7 (H) 05/21/2021   HGB 13.7 05/21/2021   HCT 41.9 05/21/2021   MCV 85.2 05/21/2021   PLT 415 (H) 05/21/2021   Lab Results  Component Value Date   NA 143 04/13/2021   K 3.7 04/13/2021   CO2 24 04/13/2021   GLUCOSE 95 04/13/2021   BUN 18 04/13/2021   CREATININE 0.77 04/13/2021   BILITOT 0.4 04/13/2021   ALKPHOS 120 04/13/2021   AST 22 04/13/2021   ALT 15 04/13/2021   PROT 7.9 04/13/2021   ALBUMIN 4.6 04/13/2021   CALCIUM 9.7 04/13/2021   ANIONGAP 14 10/30/2020   EGFR 85 04/13/2021   Lab Results  Component Value Date   CHOL 196 04/13/2021   Lab Results  Component Value Date   HDL 45 04/13/2021   Lab Results  Component Value Date   LDLCALC 120 (H) 04/13/2021   Lab Results  Component Value Date   TRIG 175 (H) 04/13/2021   Lab Results  Component Value Date   CHOLHDL 4.4 04/13/2021   Lab Results  Component Value Date   HGBA1C 5.2 04/13/2021      Assessment & Plan:   Problem List Items Addressed This Visit     Cervicogenic headache    Cervicogenic headache thought to be associated with 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 Given the presentation of pain will trial gabapentin for symptom management.  Steroid taper also provided to help with reduced inflammation as this has been effective in the past however full resolution has not  happen. Recommend gentle neck stretches.  We will send patient for vestibular rehab which may also help with some symptoms. Follow-up if symptoms worsen or fail to improve      Relevant Medications   gabapentin (NEURONTIN) 100 MG capsule   Oral infection    Oral infection not completely resolved with initial treatment of antibiotics. Will be again second treatment with antibiotics in an effort to help eradicate all infection. No red flags or systemic symptoms present today. There is jaw tenderness however it is unclear if this is coming from the postherpetic neuralgia or the infection. 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 No red flags are present today that would indicate a need for further imaging. She did receive treatment with valacyclovir and steroid taper however symptoms are still present. Review of up-to-date reveals that presence of symptoms can be prolonged despite treatment. Discussed with patient the option to trial a second steroid taper to see if we can get some resolution she is agreeable to this. We will also send for vestibular rehab as Epley maneuver at home has been effective however not completely resolved her symptoms.      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 She has had some luck with Epley maneuver at home in relieving symptoms minimally. Will send for vestibular rehab in effort for full resolution. Will also begin second steroid taper to see if  we can help reduce inflammation present. Discussed with patient option to begin gabapentin as well to help with the ongoing postherpetic nerve pain that she is experiencing in the occipital region. Recommend patient follow-up if symptoms worsen or fail to improve.      Relevant Medications   predniSONE (DELTASONE) 10 MG tablet   gabapentin (NEURONTIN) 100 MG capsule   Other Relevant Orders   PT vestibular rehab    Meds ordered this encounter  Medications   amoxicillin-clavulanate (AUGMENTIN) 875-125 MG tablet    Sig: Take 1 tablet by mouth 2 (two) times daily.    Dispense:  10 tablet    Refill:  0   predniSONE (DELTASONE) 10 MG tablet    Sig: Days 1-2 take 6 tablets by mouth, Day 3-4 take 5 tablets, Day 5-6 take 4 tablets, Day 7-8 take 3 tablets, Day 9-10 take 2 tablets, Day 11-12 take 1 tablet    Dispense:  42 tablet    Refill:  0   gabapentin (NEURONTIN) 100 MG capsule    Sig: Take 1 capsule (100 mg total) by mouth 2 (two) times daily AND 3 capsules (300 mg total) at bedtime. As needed for neuropathic pain.    Dispense:  150 capsule    Refill:  3    Follow-up: Return if symptoms worsen or fail to improve.    Orma Render, NP

## 2021-05-28 NOTE — Assessment & Plan Note (Signed)
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 Given the presentation of pain will trial gabapentin for symptom management.  Steroid taper also provided to help with reduced inflammation as this has been effective in the past however full resolution has not happen. Recommend gentle neck stretches.  We will send patient for vestibular rehab which may also help with some symptoms. Follow-up if symptoms worsen or fail to improve

## 2021-05-28 NOTE — Assessment & Plan Note (Addendum)
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 No red flags are present today that would indicate a need for further imaging. She did receive treatment with valacyclovir and steroid taper however symptoms are still present. Review of up-to-date reveals that presence of symptoms can be prolonged despite treatment. Discussed with patient the option to trial a second steroid taper to see if we can get some resolution she is agreeable to this. We will also send for vestibular rehab as Epley maneuver at home has been effective however not completely resolved her symptoms. Patient has not had the shingles vaccine and therefore this is recommended once the episode of infection has resolved as this is her second encounter having an outbreak from herpes zoster.

## 2021-05-28 NOTE — Assessment & Plan Note (Signed)
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 She has had some luck with Epley maneuver at home in relieving symptoms minimally. Will send for vestibular rehab in effort for full resolution. Will also begin second steroid taper to see if we can help reduce inflammation present. Discussed with patient option to begin gabapentin as well to help with the ongoing postherpetic nerve pain that she is experiencing in the occipital region. Recommend patient follow-up if symptoms worsen or fail to improve.

## 2021-05-28 NOTE — Assessment & Plan Note (Signed)
Oral infection not completely resolved with initial treatment of antibiotics. Will be again second treatment with antibiotics in an effort to help eradicate all infection. No red flags or systemic symptoms present today. There is jaw tenderness however it is unclear if this is coming from the postherpetic neuralgia or the infection. Recommend follow-up if symptoms worsen or fail to improve

## 2021-06-05 ENCOUNTER — Other Ambulatory Visit (HOSPITAL_BASED_OUTPATIENT_CLINIC_OR_DEPARTMENT_OTHER): Payer: Self-pay | Admitting: Nurse Practitioner

## 2021-06-05 ENCOUNTER — Other Ambulatory Visit (HOSPITAL_COMMUNITY): Payer: Self-pay

## 2021-06-05 DIAGNOSIS — K122 Cellulitis and abscess of mouth: Secondary | ICD-10-CM

## 2021-06-05 MED FILL — Amlodipine Besylate Tab 5 MG (Base Equivalent): ORAL | 90 days supply | Qty: 90 | Fill #1 | Status: AC

## 2021-06-06 ENCOUNTER — Other Ambulatory Visit (HOSPITAL_COMMUNITY): Payer: Self-pay

## 2021-06-06 ENCOUNTER — Encounter (HOSPITAL_BASED_OUTPATIENT_CLINIC_OR_DEPARTMENT_OTHER): Payer: Self-pay | Admitting: Nurse Practitioner

## 2021-06-18 ENCOUNTER — Ambulatory Visit (INDEPENDENT_AMBULATORY_CARE_PROVIDER_SITE_OTHER): Payer: Medicare Other | Admitting: Nurse Practitioner

## 2021-06-18 ENCOUNTER — Encounter (HOSPITAL_BASED_OUTPATIENT_CLINIC_OR_DEPARTMENT_OTHER): Payer: Self-pay | Admitting: Nurse Practitioner

## 2021-06-18 ENCOUNTER — Other Ambulatory Visit: Payer: Self-pay

## 2021-06-18 VITALS — BP 147/89 | HR 98 | Ht 63.0 in | Wt 187.4 lb

## 2021-06-18 DIAGNOSIS — R682 Dry mouth, unspecified: Secondary | ICD-10-CM | POA: Diagnosis not present

## 2021-06-18 DIAGNOSIS — H04123 Dry eye syndrome of bilateral lacrimal glands: Secondary | ICD-10-CM

## 2021-06-18 DIAGNOSIS — H8113 Benign paroxysmal vertigo, bilateral: Secondary | ICD-10-CM | POA: Diagnosis not present

## 2021-06-18 DIAGNOSIS — R599 Enlarged lymph nodes, unspecified: Secondary | ICD-10-CM | POA: Insufficient documentation

## 2021-06-18 HISTORY — DX: Enlarged lymph nodes, unspecified: R59.9

## 2021-06-18 NOTE — Assessment & Plan Note (Signed)
See 'dry mouth"

## 2021-06-18 NOTE — Progress Notes (Signed)
Established Patient Office Visit  Subjective:  Patient ID: Julia Gutierrez, female    DOB: 1955-12-23  Age: 65 y.o. MRN: [id number]  CC:  Chief Complaint  Patient presents with   Dizziness    Patient states she is still dizzy most times; the only time she is not dizzy is when she is lying down.  Patient states she wants to discuss how  she has been feeling since taking prednisone.    HPI Julia Gutierrez presents for follow-up for vertigo and to discuss symptom changes with prednisone.  Vertigo She has now completed 3 vestibular rehab treatments however does not feel any significant improvement as of this time. She did not have any significant improvement with use of the prednisone taper for her vertigo symptoms. She would like to continue with the vestibular rehab to see if this will continue to help  Dry eye/dry mouth Patient endorses a long-term symptom of severe dry eyes and dry mouth and awakening in the morning with a large amount of dried phlegm in her throat.  She reports that recently while on a steroid taper for her vertigo symptoms this suddenly resolved and she also had resolution of many of her other symptoms including joint pain and fatigue. She tells me during this period that "for the first time in a long time it felt normal again" She is unable to recall what day of the steroid taper that she began to notice the symptom improvement however it did stop once she stopped the steroid taper. She tells me that she has been tested in the past for possible Sjogren's however this was only performed with an ANA which was negative and she was told that it was not possible that she had had this condition.  Past Medical History:  Diagnosis Date   Cervical spondylosis 05/04/2021   Chronic fatigue 04/13/2021   Encounter to establish care 04/13/2021   Hypertension    Hypothyroidism    Increased thirst 04/13/2021   Mouth dryness 04/13/2021   Palpitations 04/13/2021   Vertigo 04/13/2021     History reviewed. No pertinent surgical history.  Family History  Problem Relation Age of Onset   Diabetes Mother    Hypertension Mother    Hypertension Brother     Social History   Socioeconomic History   Marital status: Divorced    Spouse name: Not on file   Number of children: 3   Years of education: 74   Highest education level: Master's degree (e.g., MA, MS, MEng, MEd, MSW, MBA)  Occupational History   Occupation: Human resources officer  Tobacco Use   Smoking status: Never   Smokeless tobacco: Never  Vaping Use   Vaping Use: Never used  Substance and Sexual Activity   Alcohol use: Yes    Alcohol/week: 1.0 standard drink    Types: 1 Glasses of wine per week    Comment: Glass of wine once a year   Drug use: Never   Sexual activity: Not on file  Other Topics Concern   Not on file  Social History Narrative   Not on file   Social Determinants of Health   Financial Resource Strain: Not on file  Food Insecurity: Not on file  Transportation Needs: Not on file  Physical Activity: Not on file  Stress: Not on file  Social Connections: Not on file  Intimate Partner Violence: Not on file    Outpatient Medications Prior to Visit  Medication Sig Dispense Refill   ibuprofen (ADVIL) 800 MG tablet Take  800 mg by mouth every 8 (eight) hours as needed.     acetaminophen (TYLENOL) 325 MG tablet Take 650 mg by mouth every 6 (six) hours as needed.     amLODipine (NORVASC) 5 MG tablet TAKE 1 TABLET BY MOUTH ONCE DAILY. 90 tablet 3   aspirin 325 MG tablet Take 325 mg by mouth daily.     cyclobenzaprine (FLEXERIL) 5 MG tablet Take 1 tablet (5 mg total) by mouth 3 (three) times daily as needed for muscle spasms. 30 tablet 2   gabapentin (NEURONTIN) 100 MG capsule Take 1 capsule (100 mg total) by mouth 2 (two) times daily AND 3 capsules (300 mg total) at bedtime. As needed for neuropathic pain. 150 capsule 3   hydrochlorothiazide (HYDRODIURIL) 25 MG tablet TAKE 1 TABLET BY MOUTH ONCE  DAILY. 90 tablet 3   meloxicam (MOBIC) 15 MG tablet Take 1 tablet (15 mg total) by mouth daily. (Patient not taking: Reported on 06/18/2021) 30 tablet 3   Multiple Vitamins-Minerals (MULTIVITAMIN WITH MINERALS) tablet Take 1 tablet by mouth daily.     SUMAtriptan (IMITREX) 50 MG tablet Take 1 tablet by mouth at first sign of migraine. May repeat in 2 hours if headache persists or recurs. 12 tablet 6   amoxicillin-clavulanate (AUGMENTIN) 875-125 MG tablet Take 1 tablet by mouth 2 (two) times daily. 10 tablet 0   predniSONE (DELTASONE) 10 MG tablet Days 1-2 take 6 tablets by mouth, Day 3-4 take 5 tablets, Day 5-6 take 4 tablets, Day 7-8 take 3 tablets, Day 9-10 take 2 tablets, Day 11-12 take 1 tablet 42 tablet 0   No facility-administered medications prior to visit.    Allergies  Allergen Reactions   Promethazine Other (See Comments)    Muscle spasms Other reaction(s): Other (See Comments) Muscle spasms Muscle spasms    ROS Review of Systems All review of systems negative except what is listed in the HPI    Objective:    Physical Exam Vitals and nursing note reviewed.  Constitutional:      Appearance: Normal appearance.  HENT:     Head: Normocephalic.     Mouth/Throat:     Mouth: Mucous membranes are dry.  Eyes:     Extraocular Movements: Extraocular movements intact.     Conjunctiva/sclera: Conjunctivae normal.  Neck:     Vascular: No carotid bruit.  Cardiovascular:     Rate and Rhythm: Normal rate and regular rhythm.     Pulses: Normal pulses.     Heart sounds: Normal heart sounds.  Pulmonary:     Effort: Pulmonary effort is normal.     Breath sounds: Normal breath sounds.  Musculoskeletal:     Cervical back: Normal range of motion.     Right lower leg: No edema.     Left lower leg: No edema.  Skin:    General: Skin is warm and dry.     Capillary Refill: Capillary refill takes less than 2 seconds.  Neurological:     General: No focal deficit present.     Mental  Status: She is alert and oriented to person, place, and time.  Psychiatric:        Mood and Affect: Mood normal.        Behavior: Behavior normal.        Thought Content: Thought content normal.        Judgment: Judgment normal.    BP (!) 147/89   Pulse 98   Ht 5' 3"  (1.6 m)  Wt 187 lb 6.4 oz (85 kg)   SpO2 97%   BMI 33.20 kg/m  Wt Readings from Last 3 Encounters:  06/18/21 187 lb 6.4 oz (85 kg)  05/28/21 183 lb (83 kg)  05/22/21 183 lb (83 kg)     Health Maintenance Due  Topic Date Due   TETANUS/TDAP  Never done   COLONOSCOPY (Pts 45-49yr Insurance coverage will need to be confirmed)  Never done   MAMMOGRAM  Never done   Zoster Vaccines- Shingrix (1 of 2) Never done   DEXA SCAN  Never done   PNA vac Low Risk Adult (1 of 2 - PCV13) Never done   COVID-19 Vaccine (2 - Moderna series) 10/01/2020    There are no preventive care reminders to display for this patient.  Lab Results  Component Value Date   TSH 3.800 04/13/2021   Lab Results  Component Value Date   WBC 12.7 (H) 05/21/2021   HGB 13.7 05/21/2021   HCT 41.9 05/21/2021   MCV 85.2 05/21/2021   PLT 415 (H) 05/21/2021   Lab Results  Component Value Date   NA 143 04/13/2021   K 3.7 04/13/2021   CO2 24 04/13/2021   GLUCOSE 95 04/13/2021   BUN 18 04/13/2021   CREATININE 0.77 04/13/2021   BILITOT 0.4 04/13/2021   ALKPHOS 120 04/13/2021   AST 22 04/13/2021   ALT 15 04/13/2021   PROT 7.9 04/13/2021   ALBUMIN 4.6 04/13/2021   CALCIUM 9.7 04/13/2021   ANIONGAP 14 10/30/2020   EGFR 85 04/13/2021   Lab Results  Component Value Date   CHOL 196 04/13/2021   Lab Results  Component Value Date   HDL 45 04/13/2021   Lab Results  Component Value Date   LDLCALC 120 (H) 04/13/2021   Lab Results  Component Value Date   TRIG 175 (H) 04/13/2021   Lab Results  Component Value Date   CHOLHDL 4.4 04/13/2021   Lab Results  Component Value Date   HGBA1C 5.2 04/13/2021      Assessment & Plan:    Problem List Items Addressed This Visit     Bilateral dry eyes    See dry mouth      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 Her spirits are still high however she is frustrated in the fact that her symptoms have not resolved as of yet. Recommend continuing the treatment twice a week for a minimum of 5 weeks as recommended by therapy and then we will reevaluate on further options that may be needed. I am hopeful that she will begin to show improvement with prolonged treatment and this will not become a lifelong concern for her. Recommend follow-up if symptoms worsen or fail to improve.      Chronic dryness of both eyes   Relevant Orders   Ambulatory referral to Rheumatology   Dry mouth    Symptoms of dry mouth, dry eye, thick mucus production, and enlarged tonsils going on for greater than 2 years. Resolution of symptoms occurred when she was taking steroid taper for vestibular symptoms which do strongly indicate a possibility of an inflammatory response causing her symptoms. Discussed with patient the option that other test can be run including anti-Ro, anti-La, and rheumatoid factor to look further into the option of Sjogren's versus other conditions. Given the complexity of these tests I do recommend that rheumatology evaluate to determine if further studies are needed.  She is agreeable to this plan for referral to rheumatology.       Relevant Orders   Ambulatory referral to Rheumatology   Enlarged glands    See dry mouth      Relevant Orders   Ambulatory referral to Rheumatology    No orders of the defined types were placed in this encounter.   Follow-up: No follow-ups on file.    Orma Render, NP

## 2021-06-18 NOTE — Patient Instructions (Signed)
I have sent the referral to rheumatology to get the specialized test and then we can consider treatment options.

## 2021-06-18 NOTE — Assessment & Plan Note (Signed)
BPPV remains present despite multiple treatment options tried. Her spirits are still high however she is frustrated in the fact that her symptoms have not resolved as of yet. Recommend continuing the treatment twice a week for a minimum of 5 weeks as recommended by therapy and then we will reevaluate on further options that may be needed. I am hopeful that she will begin to show improvement with prolonged treatment and this will not become a lifelong concern for her. Recommend follow-up if symptoms worsen or fail to improve.

## 2021-06-18 NOTE — Assessment & Plan Note (Signed)
Symptoms of dry mouth, dry eye, thick mucus production, and enlarged tonsils going on for greater than 2 years. Resolution of symptoms occurred when she was taking steroid taper for vestibular symptoms which do strongly indicate a possibility of an inflammatory response causing her symptoms. Discussed with patient the option that other test can be run including anti-Ro, anti-La, and rheumatoid factor to look further into the option of Sjogren's versus other conditions. Given the complexity of these tests I do recommend that rheumatology evaluate to determine if further studies are needed. She is agreeable to this plan for referral to rheumatology.

## 2021-07-05 MED FILL — Hydrochlorothiazide Tab 25 MG: ORAL | 90 days supply | Qty: 90 | Fill #1 | Status: AC

## 2021-07-06 ENCOUNTER — Other Ambulatory Visit (HOSPITAL_COMMUNITY): Payer: Self-pay

## 2021-07-23 ENCOUNTER — Telehealth (HOSPITAL_BASED_OUTPATIENT_CLINIC_OR_DEPARTMENT_OTHER): Payer: Self-pay

## 2021-07-23 NOTE — Progress Notes (Signed)
Office Visit Note  Patient: Julia Gutierrez             Date of Birth: January 30, 1956           MRN: [id number]             PCP: Tollie Eth, NP Referring: Tollie Eth, NP Visit Date: 07/24/2021  Subjective:  New Patient (Initial Visit) (Abnormal labs, severe dry mouth, dry eyes, fatigue, dizziness)   History of Present Illness: Julia Gutierrez is a 65 y.o. female here for chronic eye and mouth dryness and salivary gland enlargement. Symptoms are persistent since 2020 with fatigue and dry mouth complaints. She developed noticeable problems with dry eyes since about September 2021. She experienced an episode of palsy in January identified as Ramsay Hunt syndrome with normal CT imaging obtained at that time and no specific underlying cause. This was treated with oral steroids for 3 total short courses so far. 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 She feels chronic fatigue and headaches as well. So far use of eye drops has not dramatically helped symptoms. Eye exam since this started apparently only significant for dry eyes. She has already tried use of oral lozenges or mouth rinse without impressive relief. She denies any finger or toe discoloration. She has no new rashes, hair loss, swollen glands, or ulcers. She has some chronic joint pain with past history of fibromyalgia syndrome but not a primary complaint at this time.   Labs reviewed 04/2021 ANA neg CPR wnl CBC WBCs 12.7 Plts 415 HCV neg RPR neg  Activities of Daily Living:  Patient reports morning stiffness for 30 minutes.   Patient Reports nocturnal pain.  Difficulty dressing/grooming: Denies Difficulty climbing stairs: Reports Difficulty getting out of chair: Reports Difficulty using hands for taps, buttons, cutlery, and/or writing: Reports  Review of Systems  Constitutional:  Positive for fatigue.  HENT:  Positive for mouth dryness.   Eyes:   Positive for dryness.  Respiratory:  Negative for shortness of breath.   Cardiovascular:  Positive for swelling in legs/feet.  Gastrointestinal:  Positive for constipation and diarrhea.  Endocrine: Positive for excessive thirst and increased urination.  Genitourinary:  Negative for difficulty urinating.  Musculoskeletal:  Positive for joint pain, gait problem, joint pain, muscle weakness, morning stiffness and muscle tenderness.  Skin:  Negative for rash.  Allergic/Immunologic: Negative for susceptible to infections.  Neurological:  Positive for dizziness and weakness.  Hematological:  Negative for bruising/bleeding tendency.  Psychiatric/Behavioral:  Positive for sleep disturbance.    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   Benign paroxysmal vertigo of both ears 05/28/2021   Migraine with status migrainosus, not intractable 05/04/2021   Oral infection 05/04/2021   Cervicogenic headache 04/13/2021   Hypothyroidism 04/13/2021   Bilateral dry eyes 04/13/2021   Anxiety and depression 04/13/2021   Vision changes 04/13/2021    Past Medical History:  Diagnosis Date   Cervical spondylosis 05/04/2021   Chronic fatigue 04/13/2021   Encounter to establish care 04/13/2021   Hypertension    Hypothyroidism    IBS (irritable bowel syndrome)    Increased thirst 04/13/2021   Mouth dryness 04/13/2021   Palpitations 04/13/2021   Vertigo 04/13/2021    Family History  Problem Relation Age of  Onset   Diabetes Mother    Hypertension Mother    Heart disease Mother    Heart disease Father    Hypertension Father    Heart disease Sister    Hypertension Sister    Kidney failure Sister    Heart disease Brother    Hypertension Brother    Heart disease Brother    Hypertension Brother    Past Surgical History:  Procedure  Laterality Date   CHOLECYSTECTOMY     Social History   Social History Narrative   Not on file   Immunization History  Administered Date(s) Administered   Moderna Sars-Covid-2 Vaccination 09/02/2020, 09/03/2020     Objective: Vital Signs: BP 123/79 (BP Location: Right Arm, Patient Position: Sitting, Cuff Size: Normal)   Pulse 83   Resp 16   Ht 5\' 3"  (1.6 m)   Wt 180 lb (81.6 kg)   BMI 31.89 kg/m    Physical Exam HENT:     Mouth/Throat:     Comments: Mild flattening of lingual surface and erythema Eyes:     Comments: Bilateral conjunctivitis  Cardiovascular:     Rate and Rhythm: Regular rhythm.     Heart sounds: Murmur heard.  Pulmonary:     Effort: Pulmonary effort is normal.     Breath sounds: Normal breath sounds.  Skin:    Comments: Normal nailfold capillaroscopy bilaterally No digital or nail pitting  Neurological:     Mental Status: She is alert.     Musculoskeletal Exam:  Neck full ROM no tenderness Shoulders full ROM no tenderness or swelling Elbows full ROM no tenderness or swelling Wrists full ROM no tenderness or swelling Fingers mild right thumb squaring and MCP hyperextension, full ROM intact Knees full ROM, left knee crepitus present no effusions Ankles full ROM no tenderness or swelling  Investigation: No additional findings.  Imaging: No results found.  Recent Labs: Lab Results  Component Value Date   WBC 12.7 (H) 05/21/2021   HGB 13.7 05/21/2021   PLT 415 (H) 05/21/2021   NA 143 04/13/2021   K 3.7 04/13/2021   CL 103 04/13/2021   CO2 24 04/13/2021   GLUCOSE 95 04/13/2021   BUN 18 04/13/2021   CREATININE 0.77 04/13/2021   BILITOT 0.4 04/13/2021   ALKPHOS 120 04/13/2021   AST 22 04/13/2021   ALT 15 04/13/2021   PROT 7.9 04/13/2021   ALBUMIN 4.6 04/13/2021   CALCIUM 9.7 04/13/2021    Speciality Comments: No specialty comments available.  Procedures:  No procedures performed Allergies: Promethazine   Assessment / Plan:      Visit Diagnoses: Bilateral dry eyes Dry mouth - Plan: Sjogrens syndrome-A extractable nuclear antibody, IGG SUBCLASS 4, IgG  Possible sicca symptoms but no supporting serology no salivary gland changes appreciable at this time. Will check SSA Abs also serum IgG 4 and total IgG for any serology evidence of these primary processes. Discussed some symptomatic treatments many of which she is already doing, can consider secretagogues if serology is positive with just this local involvement.  Benign paroxysmal vertigo of both ears - Plan: Sjogrens syndrome-A extractable nuclear antibody, IGG SUBCLASS 4, IgG  Previously described BPPV she reports ongoing vertigo symptoms no nystagmus no gait abnormality appreciable today. Atypical for an inflammatory neuropathy and no hearing loss. Checking autoimmune serology as above.  Orders: Orders Placed This Encounter  Procedures   Sjogrens syndrome-A extractable nuclear antibody   IGG SUBCLASS 4   IgG    No orders of the  defined types were placed in this encounter.    Follow-Up Instructions: Return in about 3 weeks (around 08/14/2021).   Fuller Plan, MD  Note - This record has been created using [employer].  Chart creation errors have been sought, but may not always  have been located. Such creation errors do not reflect on  the standard of medical care.

## 2021-07-23 NOTE — Telephone Encounter (Signed)
Spoke with patient to reschedule nurse visit. Patient stated she was at work and will call back to schedule.

## 2021-07-24 ENCOUNTER — Encounter: Payer: Self-pay | Admitting: Internal Medicine

## 2021-07-24 ENCOUNTER — Other Ambulatory Visit: Payer: Self-pay

## 2021-07-24 ENCOUNTER — Ambulatory Visit (INDEPENDENT_AMBULATORY_CARE_PROVIDER_SITE_OTHER): Payer: Medicare Other | Admitting: Internal Medicine

## 2021-07-24 VITALS — BP 123/79 | HR 83 | Resp 16 | Ht 63.0 in | Wt 180.0 lb

## 2021-07-24 DIAGNOSIS — H8113 Benign paroxysmal vertigo, bilateral: Secondary | ICD-10-CM | POA: Diagnosis not present

## 2021-07-24 DIAGNOSIS — H04123 Dry eye syndrome of bilateral lacrimal glands: Secondary | ICD-10-CM

## 2021-07-24 DIAGNOSIS — R682 Dry mouth, unspecified: Secondary | ICD-10-CM

## 2021-07-25 ENCOUNTER — Other Ambulatory Visit (HOSPITAL_COMMUNITY): Payer: Self-pay

## 2021-07-26 ENCOUNTER — Ambulatory Visit (HOSPITAL_BASED_OUTPATIENT_CLINIC_OR_DEPARTMENT_OTHER): Payer: Medicare Other

## 2021-07-26 ENCOUNTER — Encounter (HOSPITAL_BASED_OUTPATIENT_CLINIC_OR_DEPARTMENT_OTHER): Payer: Self-pay

## 2021-07-28 LAB — IGG SUBCLASS 4: IgG Subclass 4: 3.5 mg/dL — ABNORMAL LOW (ref 4.0–86.0)

## 2021-07-28 LAB — IGG: IgG (Immunoglobin G), Serum: 1387 mg/dL (ref 600–1540)

## 2021-07-28 LAB — SJOGRENS SYNDROME-A EXTRACTABLE NUCLEAR ANTIBODY: SSA (Ro) (ENA) Antibody, IgG: <1 AI

## 2021-08-13 NOTE — Progress Notes (Signed)
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 female here for follow up for persistent dry eyes and mouth lab tests at initial visit showed low IgG subclass 4 level, negative SSA antibody, and mildly high WBC of 12.7 and platelets 415. Overall results reassuring without inflammatory changes at this time. She continues with persistent vertigo and migrainous type headaches.  Previous HPI 07/24/21 Julia Gutierrez is a 65 y.o. female here for chronic eye and mouth dryness and salivary gland enlargement. Symptoms are persistent since 2020 with fatigue and dry mouth complaints. She developed noticeable problems with dry eyes since about September 2021. She experienced an episode of palsy in January identified as Ramsay Hunt syndrome with normal CT imaging obtained at that time and no specific underlying cause. This was treated with oral steroids for 3 total short courses so far. 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 She feels chronic fatigue and headaches as well. So far use of eye drops has not dramatically helped symptoms. Eye exam since this started apparently only significant for dry eyes. She has already tried use of oral lozenges or mouth rinse without impressive relief. She denies any finger or toe discoloration. She has no new rashes, hair loss, swollen glands, or ulcers. She has some chronic joint pain with past history of fibromyalgia syndrome but not a primary complaint at this time.     Labs reviewed 04/2021 ANA neg CPR wnl CBC WBCs 12.7 Plts 415 HCV neg RPR neg   Review of Systems  Constitutional:  Positive for fatigue.  HENT:  Positive for mouth dryness.    Eyes:  Positive for dryness.  Respiratory:  Negative for shortness of breath.   Cardiovascular:  Positive for swelling in legs/feet.  Gastrointestinal:  Negative for constipation.  Endocrine: Positive for excessive thirst.  Genitourinary:  Negative for difficulty urinating.  Musculoskeletal:  Positive for joint pain, gait problem, joint pain, muscle weakness and muscle tenderness.  Skin:  Negative for rash.  Allergic/Immunologic: Negative for susceptible to infections.  Neurological:  Positive for dizziness.  Hematological:  Negative for bruising/bleeding tendency.  Psychiatric/Behavioral:  Negative for sleep disturbance.    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   Benign paroxysmal vertigo of both ears 05/28/2021   Migraine with status migrainosus, not intractable 05/04/2021   Oral infection 05/04/2021   Cervicogenic headache 04/13/2021   Hypothyroidism 04/13/2021   Bilateral dry eyes 04/13/2021   Anxiety and depression 04/13/2021   Vision changes 04/13/2021    Past Medical History:  Diagnosis Date   Cervical spondylosis 05/04/2021   Chronic fatigue 04/13/2021   Encounter to establish care 04/13/2021   Hypertension    Hypothyroidism    IBS (irritable bowel syndrome)    Increased thirst 04/13/2021   Mouth dryness 04/13/2021   Palpitations 04/13/2021   Vertigo 04/13/2021    Family History  Problem Relation Age of Onset   Diabetes Mother    Hypertension Mother    Heart disease Mother    Heart disease Father    Hypertension Father    Heart disease Sister    Hypertension Sister    Kidney failure Sister    Heart disease Brother    Hypertension Brother    Heart disease Brother    Hypertension Brother    Past Surgical History:  Procedure Laterality Date   CHOLECYSTECTOMY     Social History    Social History Narrative   Not on file   Immunization History  Administered Date(s) Administered   Moderna Sars-Covid-2 Vaccination 09/02/2020, 09/03/2020     Objective: Vital Signs: BP 127/82 (BP Location: Left Arm, Patient Position: Sitting, Cuff Size: Normal)   Pulse 83   Resp 16   Ht 5\' 3"  (1.6 m)   Wt 182 lb (82.6 kg)   BMI 32.24 kg/m    Physical Exam Eyes:     Comments: Mild injection or erythema b/l  Skin:    General: Skin is warm and dry.     Findings: No rash.  Neurological:     Mental Status: She is alert.  Psychiatric:        Mood and Affect: Mood normal.     Musculoskeletal Exam:  Elbows full ROM no tenderness or swelling Wrists full ROM no tenderness or swelling Fingers mild right thumb squaring and MCP hyperextension, full ROM intact Knees full ROM no tenderness or swelling    Investigation: No additional findings.  Imaging: No results found.  Recent Labs: Lab Results  Component Value Date   WBC 12.7 (H) 05/21/2021   HGB 13.7 05/21/2021   PLT 415 (H) 05/21/2021   NA 143 04/13/2021   K 3.7 04/13/2021   CL 103 04/13/2021   CO2 24 04/13/2021   GLUCOSE 95 04/13/2021   BUN 18 04/13/2021   CREATININE 0.77 04/13/2021   BILITOT 0.4 04/13/2021   ALKPHOS 120 04/13/2021   AST 22 04/13/2021   ALT 15 04/13/2021   PROT 7.9 04/13/2021   ALBUMIN 4.6 04/13/2021   CALCIUM 9.7 04/13/2021    Speciality Comments: No specialty comments available.  Procedures:  No procedures performed Allergies: Promethazine   Assessment / Plan:     Visit Diagnoses: Benign paroxysmal vertigo of both ears  Ramsay hunt syndrome - Plan: Ambulatory referral to Neurology  Diagnosed with BPPV but no improvement so far from epley maneuvers in PT from patient report. Also associated with migraine type headaches I am not sure whether directly related. She may benefit with neurology evaluation since I do not see evidence for an underlying inflammatory cause will refer  today.   Orders: Orders Placed This Encounter  Procedures   Ambulatory referral to Neurology    No orders of the defined types were placed in this encounter.    Follow-Up Instructions: No follow-ups on file.   06/14/2021, MD  Note - This record has been created using [employer].  Chart creation errors have been sought, but may not always  have been located. Such creation errors do not reflect on  the standard of medical care.

## 2021-08-14 ENCOUNTER — Ambulatory Visit (INDEPENDENT_AMBULATORY_CARE_PROVIDER_SITE_OTHER): Payer: Medicare Other | Admitting: Internal Medicine

## 2021-08-14 ENCOUNTER — Other Ambulatory Visit: Payer: Self-pay

## 2021-08-14 ENCOUNTER — Encounter: Payer: Self-pay | Admitting: Internal Medicine

## 2021-08-14 VITALS — BP 127/82 | HR 83 | Resp 16 | Ht 63.0 in | Wt 182.0 lb

## 2021-08-14 DIAGNOSIS — H8113 Benign paroxysmal vertigo, bilateral: Secondary | ICD-10-CM | POA: Diagnosis not present

## 2021-08-14 DIAGNOSIS — B0221 Postherpetic geniculate ganglionitis: Secondary | ICD-10-CM | POA: Diagnosis not present

## 2021-08-14 DIAGNOSIS — G43901 Migraine, unspecified, not intractable, with status migrainosus: Secondary | ICD-10-CM

## 2021-08-21 ENCOUNTER — Encounter: Payer: Self-pay | Admitting: Neurology

## 2021-08-28 ENCOUNTER — Other Ambulatory Visit (HOSPITAL_COMMUNITY): Payer: Self-pay

## 2021-08-28 ENCOUNTER — Ambulatory Visit (INDEPENDENT_AMBULATORY_CARE_PROVIDER_SITE_OTHER): Payer: Medicare Other | Admitting: Nurse Practitioner

## 2021-08-28 ENCOUNTER — Other Ambulatory Visit: Payer: Self-pay

## 2021-08-28 ENCOUNTER — Encounter (HOSPITAL_BASED_OUTPATIENT_CLINIC_OR_DEPARTMENT_OTHER): Payer: Self-pay | Admitting: Nurse Practitioner

## 2021-08-28 VITALS — BP 130/82 | HR 97 | Ht 63.0 in | Wt 183.0 lb

## 2021-08-28 DIAGNOSIS — M350C Sjogren syndrome with dental involvement: Secondary | ICD-10-CM | POA: Insufficient documentation

## 2021-08-28 DIAGNOSIS — M3500 Sicca syndrome, unspecified: Secondary | ICD-10-CM | POA: Diagnosis not present

## 2021-08-28 DIAGNOSIS — R42 Dizziness and giddiness: Secondary | ICD-10-CM | POA: Diagnosis not present

## 2021-08-28 DIAGNOSIS — N3000 Acute cystitis without hematuria: Secondary | ICD-10-CM | POA: Diagnosis not present

## 2021-08-28 HISTORY — DX: Acute cystitis without hematuria: N30.00

## 2021-08-28 MED ORDER — DIAZEPAM 2 MG PO TABS
2.0000 mg | ORAL_TABLET | Freq: Two times a day (BID) | ORAL | 0 refills | Status: DC | PRN
  Filled 2021-08-28: qty 60, 30d supply, fill #0

## 2021-08-28 MED ORDER — NITROFURANTOIN MONOHYD MACRO 100 MG PO CAPS
100.0000 mg | ORAL_CAPSULE | Freq: Two times a day (BID) | ORAL | 0 refills | Status: DC
  Filled 2021-08-28: qty 10, 5d supply, fill #0

## 2021-08-28 MED ORDER — PILOCARPINE HCL 5 MG PO TABS
5.0000 mg | ORAL_TABLET | Freq: Two times a day (BID) | ORAL | 2 refills | Status: DC
Start: 2021-08-28 — End: 2022-05-23
  Filled 2021-08-28: qty 60, 30d supply, fill #0
  Filled 2021-10-31 – 2021-11-09 (×2): qty 60, 30d supply, fill #1
  Filled 2022-01-11: qty 60, 30d supply, fill #2

## 2021-08-28 NOTE — Assessment & Plan Note (Addendum)
Severe dryness of the mouth and eyes of unknown etiology.  Strongly suspect autoimmune component given her additional symptoms of enlarged glands, joint pain, and recent onset of Ramsay Hunt syndrome. At this time, treatment has been limited.  No recommendations from recent evaluation with rheumatology Discussed with patient trial to treat symptoms with secretory medication, pilocarpine, and she is willing to try this. Can consider use of hydroxychloroquine if this is not helpful.  Will follow-up in 2 weeks to evaluate progress

## 2021-08-28 NOTE — Assessment & Plan Note (Signed)
Persistent symptoms despite months of physical therapy, dry needling, and medication management Symptoms are nearly debilitating and patient is understandably frustrated  Discussed the option of trial of low dose valium to see if this is helpful for her symptoms and she is receptive to trying We will start with 2mg  every 12 hours as needed for severe symptoms and reassess in 1 week.

## 2021-08-28 NOTE — Patient Instructions (Signed)
I have sent in a new medication for you to try for the dry eyes and dry mouth. It is a pill that you take twice a day. Increase to three times a day after one week if you don't notice a difference and we will check to see how you are doing on this.    I have sent the valium for the vertigo. Only take this when symptoms are getting bad and lets see if we can get it stopped.   I am going to do some more research and see if I can find anything else.

## 2021-08-28 NOTE — Progress Notes (Signed)
Acute Office Visit  Subjective:    Patient ID: Julia Gutierrez, female    DOB: 02-19-1956, 65 y.o.   MRN: [id number]  Chief Complaint  Patient presents with   bladder pain    Patient is complaining of cramping at night, once in a while burns during urination x4. Feeling of fullness. Accident in bed last night.     HPI Patient is in today for urinary symptoms including dysuria and incontinence. Symptoms have been present for the last few days. She started Azo, but stopped when she discovered it was expired. Symptoms not terrible during the day, but are significant at night.   She also has concerns for ongoing vertigo and instability on her feet.  This has been present for almost a year at this point following a viral illness  She has been going to physical therapy and having ry needling procedures performed with no success in resolution of symptoms.  She reports her symptoms will improve for a brief period of one to two days but then return  She has had several falls due to the instability. She has concerns with driving at time She has had a work up which shows underlying etiology She would like to stop the formal PT as she feels that it is not benefiting but is quite expensive for her  She is also concerned with ongoing dry eyes and dry mouth She was seen by rheumatology and told that at this time they are unable to find an etiology of her symptoms.  She reports frustration over the recommendation that she waits until her symptoms worsen so a diagnosis can be made.  She reports dental carries due to the dry mouth. At times she is unable to speak because her mouth is so dry She is making no known saliva and must rely on sips of water to speak, eat, and function She has been using biotine toothpaste and mouth washes as well as additional moisture supplements with no change. She has used restasis for her eyes in the past with no changes in her symptoms.  She is not on any medications that  would affect this.  This all started several years ago, but has worsened.   She is very frustrated over the symptoms and her quality of life at this time. She reports that she does not need a diagnosis, but is in desperate need of something to help with her symptoms. She has been hesitant to try valium for vertigo due to significant family history of addiction in mother and brothers. She reports that she is willing to try low doses at this time, if it would help improve her stability and QOL.    Past Medical History:  Diagnosis Date   Cervical spondylosis 05/04/2021   Cervicogenic headache 04/13/2021   Chronic fatigue 04/13/2021   Encounter to establish care 04/13/2021   Hypertension    Hypothyroidism    IBS (irritable bowel syndrome)    Increased thirst 04/13/2021   Mouth dryness 04/13/2021   Palpitations 04/13/2021   Vertigo 04/13/2021    Past Surgical History:  Procedure Laterality Date   CHOLECYSTECTOMY      Family History  Problem Relation Age of Onset   Diabetes Mother    Hypertension Mother    Heart disease Mother    Heart disease Father    Hypertension Father    Heart disease Sister    Hypertension Sister    Kidney failure Sister    Heart disease Brother  Hypertension Brother    Heart disease Brother    Hypertension Brother     Social History   Socioeconomic History   Marital status: Divorced    Spouse name: Not on file   Number of children: 3   Years of education: 18   Highest education level: Master's degree (e.g., MA, MS, MEng, MEd, MSW, MBA)  Occupational History   Occupation: Human resources officer  Tobacco Use   Smoking status: Never   Smokeless tobacco: Never  Scientific laboratory technician Use: Never used  Substance and Sexual Activity   Alcohol use: Not Currently    Comment: Glass of wine once a year   Drug use: Never   Sexual activity: Not on file  Other Topics Concern   Not on file  Social History Narrative   Not on file   Social Determinants of Health    Financial Resource Strain: Not on file  Food Insecurity: Not on file  Transportation Needs: Not on file  Physical Activity: Not on file  Stress: Not on file  Social Connections: Not on file  Intimate Partner Violence: Not on file    Outpatient Medications Prior to Visit  Medication Sig Dispense Refill   acetaminophen (TYLENOL) 325 MG tablet Take 650 mg by mouth every 6 (six) hours as needed.     amLODipine (NORVASC) 5 MG tablet TAKE 1 TABLET BY MOUTH ONCE DAILY. 90 tablet 3   aspirin 325 MG tablet Take 325 mg by mouth daily.     cyclobenzaprine (FLEXERIL) 5 MG tablet Take 1 tablet (5 mg total) by mouth 3 (three) times daily as needed for muscle spasms. 30 tablet 2   gabapentin (NEURONTIN) 100 MG capsule Take 1 capsule (100 mg total) by mouth 2 (two) times daily AND 3 capsules (300 mg total) at bedtime. As needed for neuropathic pain. 150 capsule 3   hydrochlorothiazide (HYDRODIURIL) 25 MG tablet TAKE 1 TABLET BY MOUTH ONCE DAILY. 90 tablet 3   ibuprofen (ADVIL) 800 MG tablet Take 800 mg by mouth every 8 (eight) hours as needed.     meloxicam (MOBIC) 15 MG tablet Take 1 tablet (15 mg total) by mouth daily. (Patient not taking: Reported on 08/14/2021) 30 tablet 3   Multiple Vitamins-Minerals (MULTIVITAMIN WITH MINERALS) tablet Take 1 tablet by mouth daily.     SUMAtriptan (IMITREX) 50 MG tablet Take 1 tablet by mouth at first sign of migraine. May repeat in 2 hours if headache persists or recurs. 12 tablet 6   No facility-administered medications prior to visit.    Allergies  Allergen Reactions   Promethazine Other (See Comments)    Muscle spasms Other reaction(s): Other (See Comments) Muscle spasms Muscle spasms    Review of Systems     Objective:    Physical Exam  BP 130/82   Pulse 97   Ht 5' 3"  (1.6 m)   Wt 183 lb (83 kg)   SpO2 97%   BMI 32.42 kg/m  Wt Readings from Last 3 Encounters:  08/28/21 183 lb (83 kg)  08/14/21 182 lb (82.6 kg)  07/24/21 180 lb (81.6  kg)    Health Maintenance Due  Topic Date Due   TETANUS/TDAP  Never done   COLONOSCOPY (Pts 45-68yr Insurance coverage will need to be confirmed)  Never done   MAMMOGRAM  Never done   Zoster Vaccines- Shingrix (1 of 2) Never done   Pneumonia Vaccine 67 Years old (1 - PCV) Never done   DEXA SCAN  Never done  COVID-19 Vaccine (2 - Moderna series) 10/01/2020   INFLUENZA VACCINE  Never done    There are no preventive care reminders to display for this patient.   Lab Results  Component Value Date   TSH 3.800 04/13/2021   Lab Results  Component Value Date   WBC 12.7 (H) 05/21/2021   HGB 13.7 05/21/2021   HCT 41.9 05/21/2021   MCV 85.2 05/21/2021   PLT 415 (H) 05/21/2021   Lab Results  Component Value Date   NA 143 04/13/2021   K 3.7 04/13/2021   CO2 24 04/13/2021   GLUCOSE 95 04/13/2021   BUN 18 04/13/2021   CREATININE 0.77 04/13/2021   BILITOT 0.4 04/13/2021   ALKPHOS 120 04/13/2021   AST 22 04/13/2021   ALT 15 04/13/2021   PROT 7.9 04/13/2021   ALBUMIN 4.6 04/13/2021   CALCIUM 9.7 04/13/2021   ANIONGAP 14 10/30/2020   EGFR 85 04/13/2021   Lab Results  Component Value Date   CHOL 196 04/13/2021   Lab Results  Component Value Date   HDL 45 04/13/2021   Lab Results  Component Value Date   LDLCALC 120 (H) 04/13/2021   Lab Results  Component Value Date   TRIG 175 (H) 04/13/2021   Lab Results  Component Value Date   CHOLHDL 4.4 04/13/2021   Lab Results  Component Value Date   HGBA1C 5.2 04/13/2021       Assessment & Plan:   Problem List Items Addressed This Visit     Vertigo    Persistent symptoms despite months of physical therapy, dry needling, and medication management Symptoms are nearly debilitating and patient is understandably frustrated  Discussed the option of trial of low dose valium to see if this is helpful for her symptoms and she is receptive to trying We will start with 19m every 12 hours as needed for severe symptoms and  reassess in 1 week.       Relevant Medications   diazepam (VALIUM) 2 MG tablet   Acute cystitis without hematuria - Primary   Relevant Medications   nitrofurantoin, macrocrystal-monohydrate, (MACROBID) 100 MG capsule   Sicca (HCC)    Severe dryness of the mouth and eyes of unknown etiology.  Strongly suspect autoimmune component given her additional symptoms of enlarged glands, joint pain, and recent onset of Ramsay Hunt syndrome. At this time, treatment has been limited.  No recommendations from recent evaluation with rheumatology Discussed with patient trial to treat symptoms with secretory medication, pilocarpine, and she is willing to try this. Can consider use of hydroxychloroquine if this is not helpful.  Will follow-up in 2 weeks to evaluate progress      Relevant Medications   pilocarpine (SALAGEN) 5 MG tablet     Meds ordered this encounter  Medications   pilocarpine (SALAGEN) 5 MG tablet    Sig: Take 1 tablet (5 mg total) by mouth 2 (two) times daily.    Dispense:  60 tablet    Refill:  2   diazepam (VALIUM) 2 MG tablet    Sig: Take 1 tablet (2 mg total) by mouth every 12 (twelve) hours as needed (vertigo).    Dispense:  60 tablet    Refill:  0   nitrofurantoin, macrocrystal-monohydrate, (MACROBID) 100 MG capsule    Sig: Take 1 capsule (100 mg total) by mouth 2 (two) times daily.    Dispense:  10 capsule    Refill:  0   F/U in 2 weeks with phone visit  Orma Render, NP

## 2021-09-03 MED FILL — Amlodipine Besylate Tab 5 MG (Base Equivalent): ORAL | 90 days supply | Qty: 90 | Fill #2 | Status: AC

## 2021-09-04 ENCOUNTER — Other Ambulatory Visit (HOSPITAL_COMMUNITY): Payer: Self-pay

## 2021-09-12 ENCOUNTER — Other Ambulatory Visit (HOSPITAL_COMMUNITY): Payer: Self-pay

## 2021-09-21 ENCOUNTER — Inpatient Hospital Stay: Payer: Medicare Other | Admitting: Oncology

## 2021-09-21 ENCOUNTER — Inpatient Hospital Stay: Payer: Medicare Other

## 2021-09-25 ENCOUNTER — Other Ambulatory Visit (HOSPITAL_BASED_OUTPATIENT_CLINIC_OR_DEPARTMENT_OTHER): Payer: Self-pay

## 2021-09-25 ENCOUNTER — Other Ambulatory Visit: Payer: Self-pay

## 2021-09-25 ENCOUNTER — Encounter (HOSPITAL_BASED_OUTPATIENT_CLINIC_OR_DEPARTMENT_OTHER): Payer: Self-pay | Admitting: Nurse Practitioner

## 2021-09-25 ENCOUNTER — Ambulatory Visit (INDEPENDENT_AMBULATORY_CARE_PROVIDER_SITE_OTHER): Payer: Medicare Other | Admitting: Nurse Practitioner

## 2021-09-25 ENCOUNTER — Other Ambulatory Visit (HOSPITAL_COMMUNITY): Payer: Self-pay

## 2021-09-25 VITALS — BP 130/100 | HR 77 | Ht 63.0 in | Wt 186.3 lb

## 2021-09-25 DIAGNOSIS — E538 Deficiency of other specified B group vitamins: Secondary | ICD-10-CM | POA: Diagnosis not present

## 2021-09-25 DIAGNOSIS — R300 Dysuria: Secondary | ICD-10-CM | POA: Diagnosis not present

## 2021-09-25 DIAGNOSIS — M3500 Sicca syndrome, unspecified: Secondary | ICD-10-CM | POA: Diagnosis not present

## 2021-09-25 MED ORDER — CYANOCOBALAMIN 1000 MCG/ML IJ SOLN
1000.0000 ug | Freq: Once | INTRAMUSCULAR | 0 refills | Status: AC
Start: 2021-09-25 — End: 2021-09-26
  Filled 2021-09-25 (×2): qty 1, 1d supply, fill #0

## 2021-09-25 MED ORDER — NITROFURANTOIN MONOHYD MACRO 100 MG PO CAPS
100.0000 mg | ORAL_CAPSULE | Freq: Two times a day (BID) | ORAL | 3 refills | Status: DC
  Filled 2021-09-25 (×2): qty 60, 30d supply, fill #0
  Filled 2021-10-25: qty 60, 30d supply, fill #1
  Filled 2021-12-02: qty 60, 30d supply, fill #2
  Filled 2022-01-13 – 2022-02-05 (×2): qty 60, 30d supply, fill #3

## 2021-09-25 MED ORDER — CYANOCOBALAMIN 1000 MCG/ML IJ SOLN: 1000.0000 ug | INTRAMUSCULAR | Status: DC

## 2021-09-25 NOTE — Assessment & Plan Note (Addendum)
Symptoms of dry eyes and dry mouth continue with no known etiology. Work-up to date has been unclear. Symptoms highly consistent with Sjogren's syndrome and it is possible that her condition is related to this without the presence of antibodies. She is clearly having complications orally from the syndrome. At last visit she was provided with a prescription for Salagen however she has not started this yet as she wanted to see if the antibiotic alone helped with some of her symptoms. She did feel that the Macrobid helped with her urinary and oral/ophthalmic symptoms.  At this time we can certainly consider restarting this medication to see if symptoms resolve. Extensive review of literature and chart reveals no clear etiology. We can consider imaging to see if there are any abnormalities noted in the lungs as she is concerned with possible valley fever however this is less likely given the current symptoms present.  Biopsy of oral mucous membrane may be the next step to help in determining what could be causing her current symptoms. Rheumatology referral has been unrevealing, hematology evaluation has been on revealing.  She does have a planned follow-up with hematology in the near future for recheck of her white blood counts which have been chronically elevated. We will monitor for symptom improvement with antibiotic use and proceed with additional testing if this is not effective.

## 2021-09-25 NOTE — Progress Notes (Signed)
Established Patient Office Visit  Subjective:  Patient ID: Julia Gutierrez, female    DOB: [birth year]  Age: 65 y.o. MRN: [id number]  CC:  Chief Complaint  Patient presents with   Acute Visit    Patient comes in today for bladder pain after urinating. Patient stated she has severe dry mouth, she would also like a vitamin B shot while in office today.     HPI Julia Gutierrez presents for discussion about "bladder pain" and chronic dry mouth.  She would also like to have a B12 injection today if possible. Admit endorses chronic dry mouth that has been going on for years now.  She does endorse pain that initially started in the left cheek and jaw and now has moved into the neck area.  She reports that her teeth are chipping and breaking easily, her mouth is constantly hurting, she has no natural saliva and is unable to eat many foods due to the dryness, and waking on a daily basis with extremely dry mouth. She reports cold sensitivity and sensitivity to sugar with her teeth. She will have dental insurance in the next few weeks and plans to go to the dentist for complete evaluation and plans to request removal of her teeth as she feels that their condition is beyond repair at this point. She is very frustrated with the ongoing condition and the inability to determine what is causing it. She does tell me that in the past recent months she has been on antibiotics 2 separate times for different infectious symptoms and both times the symptoms began to resolve while she was taking the medication.  The bladder symptoms are fairly new but resolved with her most recent antibiotic use as well. She has been seen by rheumatology and had a complete work-up with no conclusive findings.  Her bladder pain is a more recent finding and is associated with spasm-like pain at the end of urination. Urine shows no bacteria growth on recent evaluation.  She does report that these symptoms improved with recent macrobid use.    She has chronic msk pain due to fibromyalgia and recent history of Ramsay hunt syndrome earlier this year.   She has a chronic history of b12 deficiency and was previously on bimonthly injections. She has not been on this since moving to [place]. Her symptoms started while getting injections in [place]. She expresses concern about possible Valley Fever or infections in her lungs or mouth.   Past Medical History:  Diagnosis Date   Cervical spondylosis 05/04/2021   Cervicogenic headache 04/13/2021   Chronic fatigue 04/13/2021   Encounter to establish care 04/13/2021   Hypertension    Hypothyroidism    IBS (irritable bowel syndrome)    Increased thirst 04/13/2021   Mouth dryness 04/13/2021   Palpitations 04/13/2021   Vertigo 04/13/2021    Past Surgical History:  Procedure Laterality Date   CHOLECYSTECTOMY      Family History  Problem Relation Age of Onset   Diabetes Mother    Hypertension Mother    Heart disease Mother    Heart disease Father    Hypertension Father    Heart disease Sister    Hypertension Sister    Kidney failure Sister    Heart disease Brother    Hypertension Brother    Heart disease Brother    Hypertension Brother     Social History   Socioeconomic History   Marital status: Divorced    Spouse name: Not on file   Number  of children: 3   Years of education: 4   Highest education level: Master's degree (e.g., MA, MS, MEng, MEd, MSW, MBA)  Occupational History   Occupation: Human resources officer  Tobacco Use   Smoking status: Never   Smokeless tobacco: Never  Vaping Use   Vaping Use: Never used  Substance and Sexual Activity   Alcohol use: Not Currently    Comment: Glass of wine once a year   Drug use: Never   Sexual activity: Not on file  Other Topics Concern   Not on file  Social History Narrative   Not on file   Social Determinants of Health   Financial Resource Strain: Not on file  Food Insecurity: Not on file  Transportation Needs: Not on file   Physical Activity: Not on file  Stress: Not on file  Social Connections: Not on file  Intimate Partner Violence: Not on file    Outpatient Medications Prior to Visit  Medication Sig Dispense Refill   acetaminophen (TYLENOL) 325 MG tablet Take 650 mg by mouth every 6 (six) hours as needed.     amLODipine (NORVASC) 5 MG tablet TAKE 1 TABLET BY MOUTH ONCE DAILY. 90 tablet 3   aspirin 325 MG tablet Take 325 mg by mouth daily.     diazepam (VALIUM) 2 MG tablet Take 1 tablet (2 mg total) by mouth every 12 (twelve) hours as needed (vertigo). 60 tablet 0   gabapentin (NEURONTIN) 100 MG capsule Take 1 capsule (100 mg total) by mouth 2 (two) times daily AND 3 capsules (300 mg total) at bedtime. As needed for neuropathic pain. 150 capsule 3   hydrochlorothiazide (HYDRODIURIL) 25 MG tablet TAKE 1 TABLET BY MOUTH ONCE DAILY. 90 tablet 3   ibuprofen (ADVIL) 800 MG tablet Take 800 mg by mouth every 8 (eight) hours as needed.     Multiple Vitamins-Minerals (MULTIVITAMIN WITH MINERALS) tablet Take 1 tablet by mouth daily.     pilocarpine (SALAGEN) 5 MG tablet Take 1 tablet (5 mg total) by mouth 2 (two) times daily. 60 tablet 2   SUMAtriptan (IMITREX) 50 MG tablet Take 1 tablet by mouth at first sign of migraine. May repeat in 2 hours if headache persists or recurs. 12 tablet 6   nitrofurantoin, macrocrystal-monohydrate, (MACROBID) 100 MG capsule Take 1 capsule (100 mg total) by mouth 2 (two) times daily. 10 capsule 0   No facility-administered medications prior to visit.    Allergies  Allergen Reactions   Promethazine Other (See Comments)    Muscle spasms Other reaction(s): Other (See Comments) Muscle spasms Muscle spasms    ROS Review of Systems All review of systems negative except what is listed in the HPI    Objective:    Physical Exam Vitals and nursing note reviewed.  Constitutional:      Appearance: She is ill-appearing.  HENT:     Head: Normocephalic.     Nose: Nose normal.      Comments: Tongue and oral mucous membranes visibly dry with no signs of erosion, plaques, or bullae present.     Mouth/Throat:     Lips: Pink.     Mouth: Mucous membranes are dry. No lacerations, oral lesions or angioedema.     Dentition: Dental caries present.     Tongue: No lesions. Tongue does not deviate from midline.     Palate: No mass and lesions.     Pharynx: Posterior oropharyngeal erythema present.     Tonsils: No tonsillar exudate.  Eyes:  Extraocular Movements: Extraocular movements intact.     Comments: Eyes appear dry.   Neck:     Vascular: No carotid bruit.  Cardiovascular:     Rate and Rhythm: Normal rate and regular rhythm.     Pulses: Normal pulses.     Heart sounds: Murmur heard.  Pulmonary:     Effort: Pulmonary effort is normal.     Breath sounds: No wheezing or rhonchi.  Chest:     Chest wall: No tenderness.  Musculoskeletal:     Cervical back: Tenderness present.     Right lower leg: No edema.     Left lower leg: No edema.  Lymphadenopathy:     Cervical: No cervical adenopathy.  Skin:    General: Skin is warm and dry.     Capillary Refill: Capillary refill takes less than 2 seconds.     Coloration: Skin is not jaundiced.     Findings: No rash.  Neurological:     General: No focal deficit present.     Mental Status: She is alert and oriented to person, place, and time.  Psychiatric:        Mood and Affect: Mood normal.        Behavior: Behavior normal.        Thought Content: Thought content normal.        Judgment: Judgment normal.    BP (!) 130/100    Pulse 77    Ht 5' 3"  (1.6 m)    Wt 186 lb 4.8 oz (84.5 kg)    SpO2 96%    BMI 33.00 kg/m  Wt Readings from Last 3 Encounters:  09/25/21 186 lb 4.8 oz (84.5 kg)  08/28/21 183 lb (83 kg)  08/14/21 182 lb (82.6 kg)     Health Maintenance Due  Topic Date Due   TETANUS/TDAP  Never done   COLONOSCOPY (Pts 45-24yr Insurance coverage will need to be confirmed)  Never done   MAMMOGRAM  Never  done   DEXA SCAN  Never done    There are no preventive care reminders to display for this patient.  Lab Results  Component Value Date   TSH 3.800 04/13/2021   Lab Results  Component Value Date   WBC 12.7 (H) 05/21/2021   HGB 13.7 05/21/2021   HCT 41.9 05/21/2021   MCV 85.2 05/21/2021   PLT 415 (H) 05/21/2021   Lab Results  Component Value Date   NA 143 04/13/2021   K 3.7 04/13/2021   CO2 24 04/13/2021   GLUCOSE 95 04/13/2021   BUN 18 04/13/2021   CREATININE 0.77 04/13/2021   BILITOT 0.4 04/13/2021   ALKPHOS 120 04/13/2021   AST 22 04/13/2021   ALT 15 04/13/2021   PROT 7.9 04/13/2021   ALBUMIN 4.6 04/13/2021   CALCIUM 9.7 04/13/2021   ANIONGAP 14 10/30/2020   EGFR 85 04/13/2021   Lab Results  Component Value Date   CHOL 196 04/13/2021   Lab Results  Component Value Date   HDL 45 04/13/2021   Lab Results  Component Value Date   LDLCALC 120 (H) 04/13/2021   Lab Results  Component Value Date   TRIG 175 (H) 04/13/2021   Lab Results  Component Value Date   CHOLHDL 4.4 04/13/2021   Lab Results  Component Value Date   HGBA1C 5.2 04/13/2021      Assessment & Plan:   Problem List Items Addressed This Visit     [place]    Symptoms of dry  eyes and dry mouth continue with no known etiology. Work-up to date has been unclear. Symptoms highly consistent with Sjogren's syndrome and it is possible that her condition is related to this without the presence of antibodies. She is clearly having complications orally from the syndrome. At last visit she was provided with a prescription for Salagen however she has not started this yet as she wanted to see if the antibiotic alone helped with some of her symptoms. She did feel that the Macrobid helped with her urinary and oral/ophthalmic symptoms.  At this time we can certainly consider restarting this medication to see if symptoms resolve. Extensive review of literature and chart reveals no clear etiology. We  can consider imaging to see if there are any abnormalities noted in the lungs as she is concerned with possible valley fever however this is less likely given the current symptoms present.  Biopsy of oral mucous membrane may be the next step to help in determining what could be causing her current symptoms. Rheumatology referral has been unrevealing, hematology evaluation has been on revealing.  She does have a planned follow-up with hematology in the near future for recheck of her white blood counts which have been chronically elevated. We will monitor for symptom improvement with antibiotic use and proceed with additional testing if this is not effective.      Other Visit Diagnoses     Dysuria    -  Primary   Relevant Medications   nitrofurantoin, macrocrystal-monohydrate, (MACROBID) 100 MG capsule   B12 deficiency       Relevant Medications   cyanocobalamin (,VITAMIN B-12,) 1000 MCG/ML injection       Meds ordered this encounter  Medications   nitrofurantoin, macrocrystal-monohydrate, (MACROBID) 100 MG capsule    Sig: Take 1 capsule (100 mg total) by mouth 2 (two) times daily.    Dispense:  60 capsule    Refill:  3   DISCONTD: cyanocobalamin ((VITAMIN B-12)) injection 1,000 mcg   cyanocobalamin (,VITAMIN B-12,) 1000 MCG/ML injection    Sig: Inject 1 mL (1,000 mcg total) into the muscle once for 1 dose.    Dispense:  1 mL    Refill:  0    Follow-up: Return in about 3 months (around 12/24/2021).    Or[place] Render, NP

## 2021-09-25 NOTE — Patient Instructions (Signed)
Let's try the B12 and long term antibiotic to see if you notice a difference.   Let me know if you have no change in the next 2 weeks

## 2021-10-02 ENCOUNTER — Other Ambulatory Visit (HOSPITAL_BASED_OUTPATIENT_CLINIC_OR_DEPARTMENT_OTHER): Payer: Self-pay | Admitting: Nurse Practitioner

## 2021-10-02 DIAGNOSIS — R42 Dizziness and giddiness: Secondary | ICD-10-CM

## 2021-10-02 MED ORDER — DIAZEPAM 2 MG PO TABS
2.0000 mg | ORAL_TABLET | Freq: Two times a day (BID) | ORAL | 0 refills | Status: DC | PRN
  Filled 2021-10-02: qty 60, 30d supply, fill #0

## 2021-10-03 ENCOUNTER — Other Ambulatory Visit (HOSPITAL_COMMUNITY): Payer: Self-pay

## 2021-10-04 MED FILL — Hydrochlorothiazide Tab 25 MG: ORAL | 90 days supply | Qty: 90 | Fill #2 | Status: AC

## 2021-10-05 ENCOUNTER — Other Ambulatory Visit (HOSPITAL_COMMUNITY): Payer: Self-pay

## 2021-10-19 ENCOUNTER — Inpatient Hospital Stay: Payer: Medicare Other | Admitting: Oncology

## 2021-10-19 ENCOUNTER — Inpatient Hospital Stay: Payer: Medicare Other

## 2021-10-25 ENCOUNTER — Other Ambulatory Visit (HOSPITAL_COMMUNITY): Payer: Self-pay

## 2021-10-31 ENCOUNTER — Other Ambulatory Visit (HOSPITAL_BASED_OUTPATIENT_CLINIC_OR_DEPARTMENT_OTHER): Payer: Self-pay | Admitting: Nurse Practitioner

## 2021-10-31 DIAGNOSIS — R42 Dizziness and giddiness: Secondary | ICD-10-CM

## 2021-11-01 ENCOUNTER — Other Ambulatory Visit (HOSPITAL_BASED_OUTPATIENT_CLINIC_OR_DEPARTMENT_OTHER): Payer: Self-pay | Admitting: Nurse Practitioner

## 2021-11-01 ENCOUNTER — Other Ambulatory Visit (HOSPITAL_COMMUNITY): Payer: Self-pay

## 2021-11-01 DIAGNOSIS — R42 Dizziness and giddiness: Secondary | ICD-10-CM

## 2021-11-02 ENCOUNTER — Other Ambulatory Visit (HOSPITAL_BASED_OUTPATIENT_CLINIC_OR_DEPARTMENT_OTHER): Payer: Self-pay | Admitting: Nurse Practitioner

## 2021-11-02 ENCOUNTER — Other Ambulatory Visit (HOSPITAL_COMMUNITY): Payer: Self-pay

## 2021-11-02 DIAGNOSIS — R42 Dizziness and giddiness: Secondary | ICD-10-CM

## 2021-11-06 ENCOUNTER — Other Ambulatory Visit (HOSPITAL_COMMUNITY): Payer: Self-pay

## 2021-11-06 MED ORDER — DIAZEPAM 2 MG PO TABS
2.0000 mg | ORAL_TABLET | Freq: Two times a day (BID) | ORAL | 0 refills | Status: DC | PRN
  Filled 2021-11-06: qty 60, 30d supply, fill #0

## 2021-11-09 ENCOUNTER — Other Ambulatory Visit (HOSPITAL_COMMUNITY): Payer: Self-pay

## 2021-11-13 ENCOUNTER — Inpatient Hospital Stay: Payer: Medicare Other

## 2021-11-13 ENCOUNTER — Inpatient Hospital Stay: Payer: Medicare Other | Admitting: Oncology

## 2021-11-16 NOTE — Progress Notes (Signed)
NEUROLOGY CONSULTATION NOTE  Julia Gutierrez MRN: [id number] DOB: Aug 05, 1956  Referring provider: Sheliah Hatch, MD Primary care provider: Enid Skeens, NP  Reason for consult:  vertigo  Assessment/Plan:   Agree with diagnosis of Ramsay-Hunt syndrome.  Unfortunately, she has chronic vestibulopathy involving CN VIII.  She has been prescribed diazepam.  I think benzodiazepines are really only the best treatment option.  PCP may want to consider clonazepam as it can be longer acting.   Subjective:  Julia Gutierrez is a 66 year old female with HTN, hypothyroidism, IBS, cervical spondylosis who presents for migraines.  History supplemented by referring provider's note.  In January 2022, she developed right sided facial droop, slurred speech and pain and papular rash involving the outside of the right ear.  She was evaluated in the ED where she was diagnosed with Bell's palsy and discharged with prednisone and valacyclovir.  CT head personally reviewed was unremarkable.  Facial droop (upper and lower facial weakness) and rash resolved but she developed vertigo.  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    She currently takes diazepam 2mg  Q12 PRN and gabapentin.  She previously tried vestibular rehab and meclizine.   PAST MEDICAL HISTORY: Past Medical History:  Diagnosis Date   Cervical spondylosis 05/04/2021   Cervicogenic headache 04/13/2021   Chronic fatigue 04/13/2021   Encounter to establish care 04/13/2021   Hypertension    Hypothyroidism    IBS (irritable bowel syndrome)    Increased thirst 04/13/2021   Mouth dryness 04/13/2021   Palpitations 04/13/2021   Vertigo 04/13/2021    PAST SURGICAL HISTORY: Past Surgical History:  Procedure Laterality Date   CHOLECYSTECTOMY      MEDICATIONS: Current Outpatient Medications on File Prior to Visit  Medication Sig Dispense Refill   acetaminophen (TYLENOL) 325 MG tablet Take 650 mg by mouth every 6 (six) hours as  needed.     amLODipine (NORVASC) 5 MG tablet TAKE 1 TABLET BY MOUTH ONCE DAILY. 90 tablet 3   aspirin 325 MG tablet Take 325 mg by mouth daily.     diazepam (VALIUM) 2 MG tablet Take 1 tablet (2 mg total) by mouth every 12 (twelve) hours as needed (vertigo). 60 tablet 0   gabapentin (NEURONTIN) 100 MG capsule Take 1 capsule (100 mg total) by mouth 2 (two) times daily AND 3 capsules (300 mg total) at bedtime. As needed for neuropathic pain. 150 capsule 3   hydrochlorothiazide (HYDRODIURIL) 25 MG tablet TAKE 1 TABLET BY MOUTH ONCE DAILY. 90 tablet 3   ibuprofen (ADVIL) 800 MG tablet Take 800 mg by mouth every 8 (eight) hours as needed.     Multiple Vitamins-Minerals (MULTIVITAMIN WITH MINERALS) tablet Take 1 tablet by mouth daily.     nitrofurantoin, macrocrystal-monohydrate, (MACROBID) 100 MG capsule Take 1 capsule (100 mg total) by mouth 2 (two) times daily. 60 capsule 3   pilocarpine (SALAGEN) 5 MG tablet Take 1 tablet (5 mg total) by mouth 2 (two) times daily. 60 tablet 2   SUMAtriptan (IMITREX) 50 MG tablet Take 1 tablet by mouth at first sign of migraine. May repeat in 2 hours if headache persists or recurs. 12 tablet 6   No current facility-administered medications on file prior to visit.    ALLERGIES: Allergies  Allergen Reactions   Promethazine Other (See Comments)    Muscle spasms Other reaction(s): Other (See Comments) Muscle spasms Muscle spasms    FAMILY HISTORY: Family History  Problem Relation Age of Onset   Diabetes Mother  Hypertension Mother    Heart disease Mother    Heart disease Father    Hypertension Father    Heart disease Sister    Hypertension Sister    Kidney failure Sister    Heart disease Brother    Hypertension Brother    Heart disease Brother    Hypertension Brother     Objective:  Blood pressure 123/81, pulse 85, height 5\' 3"  (1.6 m), weight 172 lb 9.6 oz (78.3 kg), SpO2 98 %. General: No acute distress.  Patient appears well-groomed.    Head:  Normocephalic/atraumatic Eyes:  fundi examined but not visualized Neck: supple, no paraspinal tenderness, full range of motion Back: No paraspinal tenderness Heart: regular rate and rhythm Lungs: Clear to auscultation bilaterally. Vascular: No carotid bruits. Neurological Exam: Mental status: alert and oriented to person, place, and time, recent and remote memory intact, fund of knowledge intact, attention and concentration intact, speech fluent and not dysarthric, language intact. Cranial nerves: CN I: not tested CN II: pupils equal, round and reactive to light, visual fields intact CN III, IV, VI:  full range of motion, no nystagmus, no ptosis CN V: facial sensation intact. CN VII: upper and lower face symmetric CN VIII: hearing intact CN IX, X: gag intact, uvula midline CN XI: sternocleidomastoid and trapezius muscles intact CN XII: tongue midline Bulk & Tone: normal, no fasciculations. Motor:  muscle strength 5/5 throughout Sensation:  Pinprick, temperature and vibratory sensation intact. Deep Tendon Reflexes:  2+ throughout,  toes downgoing.   Finger to nose testing:  Without dysmetria.   Heel to shin:  Without dysmetria.   Gait:  Mildly ataxic.  Romberg with sway.    Thank you for allowing me to take part in the care of this patient.  , DO  CC:  Shon Millet, NP  Enid Skeens, MD

## 2021-11-19 ENCOUNTER — Ambulatory Visit (INDEPENDENT_AMBULATORY_CARE_PROVIDER_SITE_OTHER): Payer: Medicare Other | Admitting: Neurology

## 2021-11-19 ENCOUNTER — Other Ambulatory Visit: Payer: Self-pay

## 2021-11-19 ENCOUNTER — Encounter: Payer: Self-pay | Admitting: Neurology

## 2021-11-19 VITALS — BP 123/81 | HR 85 | Ht 63.0 in | Wt 172.6 lb

## 2021-11-19 DIAGNOSIS — B0221 Postherpetic geniculate ganglionitis: Secondary | ICD-10-CM | POA: Diagnosis not present

## 2021-11-19 DIAGNOSIS — H8191 Unspecified disorder of vestibular function, right ear: Secondary | ICD-10-CM | POA: Diagnosis not present

## 2021-11-19 NOTE — Patient Instructions (Signed)
There really is only symptomatic treatment, typically with benzodiazepines.

## 2021-11-22 ENCOUNTER — Ambulatory Visit (HOSPITAL_BASED_OUTPATIENT_CLINIC_OR_DEPARTMENT_OTHER): Payer: Medicare Other | Admitting: Nurse Practitioner

## 2021-12-02 ENCOUNTER — Other Ambulatory Visit (HOSPITAL_BASED_OUTPATIENT_CLINIC_OR_DEPARTMENT_OTHER): Payer: Self-pay

## 2021-12-03 ENCOUNTER — Other Ambulatory Visit (HOSPITAL_COMMUNITY): Payer: Self-pay

## 2021-12-04 ENCOUNTER — Other Ambulatory Visit: Payer: Self-pay

## 2021-12-04 ENCOUNTER — Ambulatory Visit (INDEPENDENT_AMBULATORY_CARE_PROVIDER_SITE_OTHER): Payer: Medicare Other | Admitting: Nurse Practitioner

## 2021-12-04 ENCOUNTER — Encounter (HOSPITAL_BASED_OUTPATIENT_CLINIC_OR_DEPARTMENT_OTHER): Payer: Self-pay | Admitting: Nurse Practitioner

## 2021-12-04 ENCOUNTER — Other Ambulatory Visit (HOSPITAL_COMMUNITY): Payer: Self-pay

## 2021-12-04 VITALS — BP 117/72 | HR 80 | Ht 63.0 in | Wt 168.8 lb

## 2021-12-04 DIAGNOSIS — B0221 Postherpetic geniculate ganglionitis: Secondary | ICD-10-CM | POA: Diagnosis not present

## 2021-12-04 DIAGNOSIS — R0781 Pleurodynia: Secondary | ICD-10-CM

## 2021-12-04 HISTORY — DX: Pleurodynia: R07.81

## 2021-12-04 LAB — POCT URINALYSIS DIP (CLINITEK)
Bilirubin, UA: NEGATIVE
Blood, UA: NEGATIVE
Glucose, UA: NEGATIVE mg/dL
Ketones, POC UA: NEGATIVE mg/dL
Leukocytes, UA: NEGATIVE
Nitrite, UA: NEGATIVE
POC PROTEIN,UA: NEGATIVE
Spec Grav, UA: 1.015 (ref 1.010–1.025)
Urobilinogen, UA: 0.2 E.U./dL
pH, UA: 7 (ref 5.0–8.0)

## 2021-12-04 MED ORDER — PREDNISONE 50 MG PO TABS
50.0000 mg | ORAL_TABLET | Freq: Every day | ORAL | 0 refills | Status: DC
Start: 2021-12-04 — End: 2022-02-20
  Filled 2021-12-04: qty 5, 5d supply, fill #0

## 2021-12-04 MED ORDER — CYCLOBENZAPRINE HCL 10 MG PO TABS
10.0000 mg | ORAL_TABLET | Freq: Three times a day (TID) | ORAL | 0 refills | Status: DC | PRN
  Filled 2021-12-04: qty 30, 10d supply, fill #0

## 2021-12-04 MED ORDER — CLONAZEPAM 1 MG PO TABS
1.0000 mg | ORAL_TABLET | Freq: Two times a day (BID) | ORAL | 2 refills | Status: DC | PRN
  Filled 2021-12-04: qty 60, 30d supply, fill #0
  Filled 2022-02-04: qty 60, 30d supply, fill #1
  Filled 2022-03-07: qty 60, 30d supply, fill #2

## 2021-12-04 NOTE — Assessment & Plan Note (Signed)
Recent evaluation with neurology. Unfortunately, this is going to be a chronic issue for her. She continues to have vertigo and unsteady gate, which will be present life long.  Nuerology recommended changing valium to clonazepam for longer acting relief. This is helpful for management of her symptoms and allows her to function in a normal capacity without severe vertigo symptoms.  We will change to clonazepam today with BID dosing. She will need this life long due to the ongoing vestibular dysfunction related to cranial nerve damage.  She is aware of warning and risks with medication. She will let me know if the new dosing and medication are not effective for her.

## 2021-12-04 NOTE — Patient Instructions (Signed)
I have sent in the prednisone burst and flexeril for your back pain. If you are not feeling any improvement in the next 2-3 days, I want you to call and let us know (you don't have to come back in). Keep using ice on the back for 20 minutes at a time at least 3 times a day.  You can try 1/2 the flexeril to see if this is helpful and always go up to a whole tab if no change.   I have changed the valium to clonazepam. This is twice a day. I would start with 1/2 tab to see how this helps with your symptoms. If it is not helpful you can switch to the whole tab twice a day.  I am so sorry this won't get better, but I am hopeful that with the medication you will feel more functional.

## 2021-12-04 NOTE — Assessment & Plan Note (Signed)
Symptoms and presentation consistent with msk injury likely due to fall. Lungs clear and no signs of decreased respiratory status on right side to indicate lung involvement. UA clear to day with no signs of hematuria to indicate possible kidney involvement. Liver normal to palpation. No gallbladder.  At this time joint decision was made to begin treatment with prednisone burst and flexeril at bedtime to help with spasms. Continue to use ice to area.  She will let me know if the pain does not improve over the next 2-3 days for possible imaging study for re-evaluation.

## 2021-12-04 NOTE — Progress Notes (Signed)
Acute Office Visit  Subjective:    Patient ID: Julia Gutierrez, female    DOB: 09/24/1956, 66 y.o.   MRN: [id number]  Chief Complaint  Patient presents with   Acute Visit    Patient presents today with right upper back pain. She fell 1 month ago and the pain has gotten worse. She rates her pain a level 4.    HPI Patient is in today for right sided flank pain that has been present for about 2-4 weeks. She reports a fall about 4 weeks ago onto pavement due to imbalance from Ramsay Hunt Syndrome, chronic. She tells me she thought that she may have fractured a rib at the time, but this has happened in the past and she worked to do her deep breathing exercises and monitor. She reports the initial sharp and burning pain has resolved, but she is left with a dull aching pain and intermittent burning. She tells me that the pain progressively worsens through the day as she moves around. It is constant. She does not have any change with eating or urination. She has not had any bowel changes, shortness of breath, cough, nausea, or vomiting. She does not have a gallbladder. She reports by the times she goes to bed the pain is at it's worst and then it eases up by morning until she starts moving around again. She is still working daily. Lifting does exacerbate the pain.   Past Medical History:  Diagnosis Date   Acute cystitis without hematuria 08/28/2021   Cervical spondylosis 05/04/2021   Cervicogenic headache 04/13/2021   Chronic fatigue 04/13/2021   Encounter to establish care 04/13/2021   Enlarged glands 06/18/2021   Hypertension    Hypothyroidism    IBS (irritable bowel syndrome)    Increased thirst 04/13/2021   Mouth dryness 04/13/2021   Palpitations 04/13/2021   Vertigo 04/13/2021    Past Surgical History:  Procedure Laterality Date   CHOLECYSTECTOMY      Family History  Problem Relation Age of Onset   Diabetes Mother    Hypertension Mother    Heart disease Mother    Heart disease  Father    Hypertension Father    Stroke Sister    Heart disease Sister    Hypertension Sister    Kidney failure Sister    Stroke Brother    Heart disease Brother    Hypertension Brother    Heart disease Brother    Hypertension Brother     Social History   Socioeconomic History   Marital status: Divorced    Spouse name: Not on file   Number of children: 3   Years of education: 18   Highest education level: Master's degree (e.g., MA, MS, MEng, MEd, MSW, MBA)  Occupational History   Occupation: Human resources officer  Tobacco Use   Smoking status: Never   Smokeless tobacco: Never  Scientific laboratory technician Use: Never used  Substance and Sexual Activity   Alcohol use: Not Currently    Comment: Glass of wine once a year   Drug use: Never   Sexual activity: Not on file  Other Topics Concern   Not on file  Social History Narrative   Not on file   Social Determinants of Health   Financial Resource Strain: Not on file  Food Insecurity: Not on file  Transportation Needs: Not on file  Physical Activity: Not on file  Stress: Not on file  Social Connections: Not on file  Intimate Partner Violence: Not on  file    Outpatient Medications Prior to Visit  Medication Sig Dispense Refill   acetaminophen (TYLENOL) 325 MG tablet Take 650 mg by mouth every 6 (six) hours as needed.     amLODipine (NORVASC) 5 MG tablet TAKE 1 TABLET BY MOUTH ONCE DAILY. 90 tablet 3   aspirin 325 MG tablet Take 325 mg by mouth daily.     gabapentin (NEURONTIN) 100 MG capsule Take 1 capsule (100 mg total) by mouth 2 (two) times daily AND 3 capsules (300 mg total) at bedtime. As needed for neuropathic pain. 150 capsule 3   hydrochlorothiazide (HYDRODIURIL) 25 MG tablet TAKE 1 TABLET BY MOUTH ONCE DAILY. 90 tablet 3   ibuprofen (ADVIL) 800 MG tablet Take 800 mg by mouth every 8 (eight) hours as needed.     Multiple Vitamins-Minerals (MULTIVITAMIN WITH MINERALS) tablet Take 1 tablet by mouth daily.     nitrofurantoin,  macrocrystal-monohydrate, (MACROBID) 100 MG capsule Take 1 capsule (100 mg total) by mouth 2 (two) times daily. 60 capsule 3   pilocarpine (SALAGEN) 5 MG tablet Take 1 tablet (5 mg total) by mouth 2 (two) times daily. 60 tablet 2   SUMAtriptan (IMITREX) 50 MG tablet Take 1 tablet by mouth at first sign of migraine. May repeat in 2 hours if headache persists or recurs. (Patient not taking: Reported on 11/19/2021) 12 tablet 6   diazepam (VALIUM) 2 MG tablet Take 1 tablet (2 mg total) by mouth every 12 (twelve) hours as needed (vertigo). 60 tablet 0   No facility-administered medications prior to visit.    Allergies  Allergen Reactions   Promethazine Other (See Comments)    Muscle spasms Other reaction(s): Other (See Comments) Muscle spasms Muscle spasms    Review of Systems  Constitutional:  Negative for chills, fatigue, fever and unexpected weight change.  Respiratory:  Negative for cough, chest tightness, shortness of breath and wheezing.   Cardiovascular:  Negative for chest pain, palpitations and leg swelling.  Gastrointestinal:  Positive for abdominal pain. Negative for abdominal distention, blood in stool, constipation, diarrhea, nausea and vomiting.  Genitourinary:  Positive for flank pain. Negative for difficulty urinating, dysuria, frequency, hematuria and pelvic pain.  Musculoskeletal:  Positive for myalgias.  Skin:  Negative for color change, rash and wound.      Objective:    Physical Exam Vitals and nursing note reviewed.  Constitutional:      Appearance: Normal appearance. She is normal weight.  HENT:     Head: Normocephalic.  Eyes:     Pupils: Pupils are equal, round, and reactive to light.  Cardiovascular:     Rate and Rhythm: Normal rate and regular rhythm.     Pulses: Normal pulses.     Heart sounds: Normal heart sounds.  Pulmonary:     Effort: Pulmonary effort is normal. No respiratory distress.     Breath sounds: Normal breath sounds. No stridor. No  wheezing, rhonchi or rales.  Chest:     Chest wall: Tenderness present.  Abdominal:     General: Bowel sounds are normal. There is no distension.     Palpations: Abdomen is soft. There is no mass.     Tenderness: There is abdominal tenderness. There is right CVA tenderness. There is no left CVA tenderness, guarding or rebound.     Hernia: No hernia is present.  Musculoskeletal:     Right lower leg: No edema.     Left lower leg: No edema.     Comments: Tenderness noted  along distal rib cage on the right side near the costovertebral angle spreading around the to the right upper quadrant. Worse on posterior side. No bony deformity noted.   Skin:    General: Skin is warm and dry.     Capillary Refill: Capillary refill takes less than 2 seconds.  Neurological:     General: No focal deficit present.     Mental Status: She is alert and oriented to person, place, and time. Mental status is at baseline.  Psychiatric:        Mood and Affect: Mood normal.        Behavior: Behavior normal.        Thought Content: Thought content normal.        Judgment: Judgment normal.    BP 117/72    Pulse 80    Ht $R'5\' 3"'eq$  (1.6 m)    Wt 168 lb 12.8 oz (76.6 kg)    SpO2 90%    BMI 29.90 kg/m  Wt Readings from Last 3 Encounters:  12/04/21 168 lb 12.8 oz (76.6 kg)  11/19/21 172 lb 9.6 oz (78.3 kg)  09/25/21 186 lb 4.8 oz (84.5 kg)    Health Maintenance Due  Topic Date Due   TETANUS/TDAP  Never done   COLONOSCOPY (Pts 45-37yrs Insurance coverage will need to be confirmed)  Never done   MAMMOGRAM  Never done   DEXA SCAN  Never done   COVID-19 Vaccine (2 - Moderna series) 10/01/2020    There are no preventive care reminders to display for this patient.   Lab Results  Component Value Date   TSH 3.800 04/13/2021   Lab Results  Component Value Date   WBC 12.7 (H) 05/21/2021   HGB 13.7 05/21/2021   HCT 41.9 05/21/2021   MCV 85.2 05/21/2021   PLT 415 (H) 05/21/2021   Lab Results  Component Value  Date   NA 143 04/13/2021   K 3.7 04/13/2021   CO2 24 04/13/2021   GLUCOSE 95 04/13/2021   BUN 18 04/13/2021   CREATININE 0.77 04/13/2021   BILITOT 0.4 04/13/2021   ALKPHOS 120 04/13/2021   AST 22 04/13/2021   ALT 15 04/13/2021   PROT 7.9 04/13/2021   ALBUMIN 4.6 04/13/2021   CALCIUM 9.7 04/13/2021   ANIONGAP 14 10/30/2020   EGFR 85 04/13/2021   Lab Results  Component Value Date   CHOL 196 04/13/2021   Lab Results  Component Value Date   HDL 45 04/13/2021   Lab Results  Component Value Date   LDLCALC 120 (H) 04/13/2021   Lab Results  Component Value Date   TRIG 175 (H) 04/13/2021   Lab Results  Component Value Date   CHOLHDL 4.4 04/13/2021   Lab Results  Component Value Date   HGBA1C 5.2 04/13/2021       Assessment & Plan:   Problem List Items Addressed This Visit     Ramsay Hunt auricular syndrome    Recent evaluation with neurology. Unfortunately, this is going to be a chronic issue for her. She continues to have vertigo and unsteady gate, which will be present life long.  Nuerology recommended changing valium to clonazepam for longer acting relief. This is helpful for management of her symptoms and allows her to function in a normal capacity without severe vertigo symptoms.  We will change to clonazepam today with BID dosing. She will need this life long due to the ongoing vestibular dysfunction related to cranial nerve damage.  She is aware  of warning and risks with medication. She will let me know if the new dosing and medication are not effective for her.       Relevant Medications   clonazePAM (KLONOPIN) 1 MG tablet   cyclobenzaprine (FLEXERIL) 10 MG tablet   Rib pain on right side - Primary    Symptoms and presentation consistent with msk injury likely due to fall. Lungs clear and no signs of decreased respiratory status on right side to indicate lung involvement. UA clear to day with no signs of hematuria to indicate possible kidney involvement. Liver  normal to palpation. No gallbladder.  At this time joint decision was made to begin treatment with prednisone burst and flexeril at bedtime to help with spasms. Continue to use ice to area.  She will let me know if the pain does not improve over the next 2-3 days for possible imaging study for re-evaluation.       Relevant Medications   cyclobenzaprine (FLEXERIL) 10 MG tablet   predniSONE (DELTASONE) 50 MG tablet   Other Relevant Orders   POCT URINALYSIS DIP (CLINITEK)     Meds ordered this encounter  Medications   clonazePAM (KLONOPIN) 1 MG tablet    Sig: Take 1 tablet (1 mg total) by mouth 2 (two) times daily as needed (chronic vertigo).    Dispense:  60 tablet    Refill:  2   cyclobenzaprine (FLEXERIL) 10 MG tablet    Sig: Take 1 tablet (10 mg total) by mouth 3 (three) times daily as needed for muscle spasms.    Dispense:  30 tablet    Refill:  0   predniSONE (DELTASONE) 50 MG tablet    Sig: Take 1 tablet (50 mg total) by mouth daily.    Dispense:  5 tablet    Refill:  0     Orma Render, NP

## 2021-12-06 ENCOUNTER — Other Ambulatory Visit (HOSPITAL_COMMUNITY): Payer: Self-pay

## 2021-12-07 ENCOUNTER — Ambulatory Visit (HOSPITAL_BASED_OUTPATIENT_CLINIC_OR_DEPARTMENT_OTHER): Payer: Medicare Other | Admitting: Nurse Practitioner

## 2021-12-07 ENCOUNTER — Other Ambulatory Visit (HOSPITAL_BASED_OUTPATIENT_CLINIC_OR_DEPARTMENT_OTHER): Payer: Self-pay | Admitting: Registered Nurse

## 2021-12-07 ENCOUNTER — Other Ambulatory Visit (HOSPITAL_BASED_OUTPATIENT_CLINIC_OR_DEPARTMENT_OTHER): Payer: Self-pay | Admitting: Nurse Practitioner

## 2021-12-07 ENCOUNTER — Other Ambulatory Visit (HOSPITAL_COMMUNITY): Payer: Self-pay

## 2021-12-09 MED ORDER — AMLODIPINE BESYLATE 5 MG PO TABS
5.0000 mg | ORAL_TABLET | Freq: Every day | ORAL | 3 refills | Status: DC
  Filled 2021-12-09: qty 90, 90d supply, fill #0
  Filled 2022-03-07: qty 90, 90d supply, fill #1
  Filled 2022-06-04: qty 90, 90d supply, fill #2
  Filled 2022-09-04: qty 90, 90d supply, fill #3

## 2021-12-10 ENCOUNTER — Other Ambulatory Visit (HOSPITAL_COMMUNITY): Payer: Self-pay

## 2021-12-11 ENCOUNTER — Other Ambulatory Visit (HOSPITAL_COMMUNITY): Payer: Self-pay

## 2021-12-30 ENCOUNTER — Encounter (HOSPITAL_BASED_OUTPATIENT_CLINIC_OR_DEPARTMENT_OTHER): Payer: Self-pay | Admitting: Nurse Practitioner

## 2022-01-01 ENCOUNTER — Other Ambulatory Visit (HOSPITAL_BASED_OUTPATIENT_CLINIC_OR_DEPARTMENT_OTHER): Payer: Self-pay

## 2022-01-02 ENCOUNTER — Other Ambulatory Visit (HOSPITAL_COMMUNITY): Payer: Self-pay

## 2022-01-07 ENCOUNTER — Other Ambulatory Visit (HOSPITAL_BASED_OUTPATIENT_CLINIC_OR_DEPARTMENT_OTHER): Payer: Self-pay | Admitting: Registered Nurse

## 2022-01-07 ENCOUNTER — Other Ambulatory Visit (HOSPITAL_COMMUNITY): Payer: Self-pay

## 2022-01-07 ENCOUNTER — Other Ambulatory Visit (HOSPITAL_BASED_OUTPATIENT_CLINIC_OR_DEPARTMENT_OTHER): Payer: Self-pay | Admitting: Nurse Practitioner

## 2022-01-07 MED ORDER — HYDROCHLOROTHIAZIDE 25 MG PO TABS
25.0000 mg | ORAL_TABLET | Freq: Every day | ORAL | 3 refills | Status: DC
  Filled 2022-01-07: qty 90, 90d supply, fill #0
  Filled 2022-04-12: qty 90, 90d supply, fill #1
  Filled 2022-07-11: qty 90, 90d supply, fill #2
  Filled 2022-09-04 – 2022-10-11 (×2): qty 90, 90d supply, fill #3

## 2022-01-08 ENCOUNTER — Other Ambulatory Visit (HOSPITAL_COMMUNITY): Payer: Self-pay

## 2022-01-11 ENCOUNTER — Other Ambulatory Visit (HOSPITAL_COMMUNITY): Payer: Self-pay

## 2022-01-14 ENCOUNTER — Other Ambulatory Visit (HOSPITAL_COMMUNITY): Payer: Self-pay

## 2022-01-22 ENCOUNTER — Other Ambulatory Visit (HOSPITAL_COMMUNITY): Payer: Self-pay

## 2022-01-23 ENCOUNTER — Ambulatory Visit (HOSPITAL_BASED_OUTPATIENT_CLINIC_OR_DEPARTMENT_OTHER): Payer: Medicare Other | Admitting: Nurse Practitioner

## 2022-01-23 ENCOUNTER — Telehealth (HOSPITAL_BASED_OUTPATIENT_CLINIC_OR_DEPARTMENT_OTHER): Payer: Self-pay

## 2022-01-23 NOTE — Telephone Encounter (Signed)
Patient is experiencing nausea, fever, and vomiting. Refused virtual appt. ? ?Patient states she will call back to reschedule in a couple days. ?

## 2022-02-04 ENCOUNTER — Other Ambulatory Visit (HOSPITAL_COMMUNITY): Payer: Self-pay

## 2022-02-04 ENCOUNTER — Other Ambulatory Visit (HOSPITAL_BASED_OUTPATIENT_CLINIC_OR_DEPARTMENT_OTHER): Payer: Self-pay | Admitting: Nurse Practitioner

## 2022-02-04 DIAGNOSIS — H8113 Benign paroxysmal vertigo, bilateral: Secondary | ICD-10-CM

## 2022-02-04 DIAGNOSIS — B0221 Postherpetic geniculate ganglionitis: Secondary | ICD-10-CM

## 2022-02-04 MED ORDER — GABAPENTIN 100 MG PO CAPS
ORAL_CAPSULE | ORAL | 3 refills | Status: DC
  Filled 2022-02-04: qty 150, 30d supply, fill #0
  Filled 2022-04-12: qty 150, 30d supply, fill #1
  Filled 2022-05-12 – 2022-05-15 (×2): qty 150, 30d supply, fill #2
  Filled 2022-12-01: qty 150, 30d supply, fill #3

## 2022-02-05 ENCOUNTER — Other Ambulatory Visit (HOSPITAL_COMMUNITY): Payer: Self-pay

## 2022-02-20 ENCOUNTER — Encounter (HOSPITAL_BASED_OUTPATIENT_CLINIC_OR_DEPARTMENT_OTHER): Payer: Self-pay | Admitting: Nurse Practitioner

## 2022-02-20 ENCOUNTER — Other Ambulatory Visit (HOSPITAL_COMMUNITY): Payer: Self-pay

## 2022-02-20 ENCOUNTER — Ambulatory Visit (INDEPENDENT_AMBULATORY_CARE_PROVIDER_SITE_OTHER): Payer: Medicare Other | Admitting: Nurse Practitioner

## 2022-02-20 VITALS — BP 117/72 | HR 86 | Ht 62.0 in | Wt 160.6 lb

## 2022-02-20 DIAGNOSIS — F419 Anxiety disorder, unspecified: Secondary | ICD-10-CM | POA: Diagnosis not present

## 2022-02-20 DIAGNOSIS — Z0000 Encounter for general adult medical examination without abnormal findings: Secondary | ICD-10-CM

## 2022-02-20 DIAGNOSIS — E538 Deficiency of other specified B group vitamins: Secondary | ICD-10-CM

## 2022-02-20 DIAGNOSIS — E559 Vitamin D deficiency, unspecified: Secondary | ICD-10-CM

## 2022-02-20 DIAGNOSIS — Z23 Encounter for immunization: Secondary | ICD-10-CM

## 2022-02-20 DIAGNOSIS — F32A Depression, unspecified: Secondary | ICD-10-CM

## 2022-02-20 DIAGNOSIS — E876 Hypokalemia: Secondary | ICD-10-CM

## 2022-02-20 DIAGNOSIS — H8113 Benign paroxysmal vertigo, bilateral: Secondary | ICD-10-CM

## 2022-02-20 DIAGNOSIS — M3500 Sicca syndrome, unspecified: Secondary | ICD-10-CM

## 2022-02-20 DIAGNOSIS — R1011 Right upper quadrant pain: Secondary | ICD-10-CM

## 2022-02-20 DIAGNOSIS — K047 Periapical abscess without sinus: Secondary | ICD-10-CM

## 2022-02-20 MED ORDER — ZOSTER VAC RECOMB ADJUVANTED 50 MCG/0.5ML IM SUSR: 0.5000 mL | Freq: Once | INTRAMUSCULAR | 1 refills | Status: AC

## 2022-02-20 MED ORDER — CLINDAMYCIN HCL 300 MG PO CAPS
300.0000 mg | ORAL_CAPSULE | Freq: Four times a day (QID) | ORAL | 0 refills | Status: DC
  Filled 2022-02-20: qty 28, 7d supply, fill #0

## 2022-02-20 MED ORDER — CHLORHEXIDINE GLUCONATE 0.12 % MT SOLN
15.0000 mL | Freq: Two times a day (BID) | OROMUCOSAL | 5 refills | Status: DC
  Filled 2022-02-20: qty 1892, 63d supply, fill #0
  Filled 2022-05-23: qty 1892, 63d supply, fill #1
  Filled 2022-12-01: qty 473, 16d supply, fill #2
  Filled 2022-12-10: qty 1892, 63d supply, fill #2

## 2022-02-20 NOTE — Patient Instructions (Addendum)
It was a pleasure seeing you today. I hope your time spent with [place] was pleasant and helpful. Please let us know if there is anything we can do to improve the service you receive.  ? ?Today we discussed concerns with: ? ?Encounter for annual physical exam ? ?We will get labs today as part of your physical exam. I will be in touch with you with the results of the labs ?I have placed an order for the ultrasound of your belly. This will be done at [facility]). They will call you to schedule this.  ?I have sent in antibiotics for your tooth infection and a rinse to use twice a day.  ? ? ?[facility] ?Located inside [facility] on the first floor of the building ?Address: [address], [place], [place] [address] ?Hours: Monday - Friday  8:00 am - 5:00 pm ?Phone: [phone] ?[facility] ? ? ? ? ? ? ?Important Office Information ?Lab Results ?If labs were ordered, please note that you will see results through MyChart as soon as they come available from [facility].  ?It takes up to 5 business days for the results to be routed to me and for me to review them once all of the lab results have come through from [facility]. I will make recommendations based on your results and send these through MyChart or someone from the office will call you to discuss. If your labs are abnormal, we may contact you to schedule a visit to discuss the results and make recommendations.  ?If you have not heard from [place] within 5 business days or you have waited longer than a week and your lab results have not come through on MyChart, please feel free to call the office or send a message through MyChart to follow-up on these labs.  ? ?Referrals ?If referrals were placed today, the office where the referral was sent will contact you either by phone or through MyChart to set up scheduling. Please note that it can take up to a week for the referral office to contact you. If you do  not hear from them in a week, please contact the referral office directly to inquire about scheduling.  ? ?Condition Treated ?If your condition worsens or you begin to have new symptoms, please schedule a follow-up appointment for further evaluation. If you are not sure if an appointment is needed, you may call the office to leave a message for the nurse and someone will contact you with recommendations.  ?If you have an urgent or life threatening emergency, please do not call the office, but seek emergency evaluation by calling 911 or going to the nearest emergency room for evaluation.  ? ?MyChart and Phone Calls ?Please do not use MyChart for urgent messages. It may take up to 3 business days for MyChart messages to be read by staff and if they are unable to handle the request, an additional 3 business days for them to be routed to me and for my response.  ?Messages sent to the provider through MyChart do not come directly to the provider, please allow time for these messages to be routed and for me to respond.  ?We get a large volume of MyChart messages daily and these are responded to in the order received.  ? ?For urgent messages, please call the office at [phone] and speak with the front office staff or leave a message on the line of my assistant for  guidance.  ?We are seeing patients from the hours of 8:00 am through 5:00 pm and calls directly to the nurse may not be answered immediately due to seeing patients, but your call will be returned as soon as possible.  ?Phone  messages received after 4:00 PM Monday through Thursday may not be returned until the following business day. Phone messages received after 11:00 AM on Friday may not be returned until Monday.  ? ?After Hours ?We share on call hours with providers from other offices. If you have an urgent need after hours that cannot wait until the next business day, please contact the on call provider by calling the office number. A nurse will speak  with you and contact the provider if needed for recommendations.  ?If you have an urgent or life threatening emergency after hours, please do not call the on call provider, but seek emergency evaluation by calling 911 or going to the nearest emergency room for evaluation.  ? ?Paperwork ?All paperwork requires a minimum of 5 days to complete and return to you or the designated personnel. Please keep this in mind when bringing in forms or sending requests for paperwork completion to the office.  ?  ?

## 2022-02-20 NOTE — Progress Notes (Signed)
BP 117/72   Pulse 86   Ht 5' 2"  (1.575 m)   Wt 160 lb 9.6 oz (72.8 kg)   SpO2 96%   BMI 29.37 kg/m    Subjective:    Patient ID: Julia Gutierrez, female    DOB: January 18, 1956, 66 y.o.   MRN: [id number]  HPI: Julia Gutierrez is a 66 y.o. female presenting on 02/20/2022 for comprehensive medical examination.   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  She is not currently sexually active  She denies concerns today about STI  She denies concerns about skin changes today  She denies concerns about bowel changes today  She denies concerns about bladder changes today   Most Recent Depression Screen:     02/20/2022    9:03 AM 04/13/2021    9:35 AM  Depression screen PHQ 2/9  Decreased Interest 0 1  Down, Depressed, Hopeless 0 1  PHQ - 2 Score 0 2  Altered sleeping  3  Tired, decreased energy  3  Change in appetite  3  Feeling bad or failure about yourself   3  Trouble concentrating  3  Moving slowly or fidgety/restless  3  Suicidal thoughts  0  PHQ-9 Score  20  Difficult doing work/chores  Somewhat difficult   Most Recent Anxiety Screen:     04/13/2021    9:35 AM  GAD 7 : Generalized Anxiety Score  Nervous, Anxious, on Edge 0  Control/stop worrying 2  Worry too much - different things 2  Trouble relaxing 0  Restless 0  Easily annoyed or irritable  2  Afraid - awful might happen 2  Total GAD 7 Score 8  Anxiety Difficulty Somewhat difficult   Most Recent Fall Screen:    02/20/2022    9:03 AM 05/04/2021   10:40 AM 04/13/2021    9:34 AM  Fall Risk   Falls in the past year? 1 0 0  Number falls in past yr: 0 0 0  Injury with Fall? 0 0 0  Risk for fall due to : No Fall Risks No Fall Risks No Fall Risks  Follow up Falls evaluation completed;Education provided Falls evaluation completed Falls evaluation completed    All ROS negative except what is listed above and in the HPI.   Past medical history, surgical history, medications, allergies, family history and social history reviewed with patient today and changes made to appropriate areas of the chart.  Past Medical History:  Past Medical History:  Diagnosis Date   Acute cystitis without hematuria 08/28/2021   Cervical spondylosis 05/04/2021   Cervicogenic headache 04/13/2021   Chronic fatigue 04/13/2021   Encounter to establish care 04/13/2021   Enlarged glands 06/18/2021   Hypertension    Hypothyroidism    IBS (irritable bowel syndrome)    Increased thirst 04/13/2021  Mouth dryness 04/13/2021   Palpitations 04/13/2021   Vertigo 04/13/2021   Medications:  Current Outpatient Medications on File Prior to Visit  Medication Sig   acetaminophen (TYLENOL) 325 MG tablet Take 650 mg by mouth every 6 (six) hours as needed.   amLODipine (NORVASC) 5 MG tablet Take 1 tablet (5 mg total) by mouth daily.   aspirin 325 MG tablet Take 325 mg by mouth daily.   clonazePAM (KLONOPIN) 1 MG tablet Take 1 tablet (1 mg total) by mouth 2 (two) times daily as needed (chronic vertigo).   cyclobenzaprine (FLEXERIL) 10 MG tablet Take 1 tablet (10 mg total) by mouth 3 (three) times daily as needed for muscle spasms.   gabapentin (NEURONTIN) 100 MG capsule Take 1 capsule (100 mg total) by mouth 2 (two) times daily AND 3 capsules (300 mg total) at bedtime. Use as needed for neuropathic pain.    hydrochlorothiazide (HYDRODIURIL) 25 MG tablet Take 1 tablet (25 mg total) by mouth daily.   ibuprofen (ADVIL) 800 MG tablet Take 800 mg by mouth every 8 (eight) hours as needed.   Multiple Vitamins-Minerals (MULTIVITAMIN WITH MINERALS) tablet Take 1 tablet by mouth daily.   nitrofurantoin, macrocrystal-monohydrate, (MACROBID) 100 MG capsule Take 1 capsule (100 mg total) by mouth 2 (two) times daily.   pilocarpine (SALAGEN) 5 MG tablet Take 1 tablet (5 mg total) by mouth 2 (two) times daily.   SUMAtriptan (IMITREX) 50 MG tablet Take 1 tablet by mouth at first sign of migraine. May repeat in 2 hours if headache persists or recurs.   No current facility-administered medications on file prior to visit.   Surgical History:  Past Surgical History:  Procedure Laterality Date   CHOLECYSTECTOMY     Allergies:  Allergies  Allergen Reactions   Promethazine Other (See Comments)    Muscle spasms Other reaction(s): Other (See Comments) Muscle spasms Muscle spasms   Social History:  Social History   Socioeconomic History   Marital status: Divorced    Spouse name: Not on file   Number of children: 3   Years of education: 18   Highest education level: Master's degree (e.g., MA, MS, MEng, MEd, MSW, MBA)  Occupational History   Occupation: Human resources officer  Tobacco Use   Smoking status: Never   Smokeless tobacco: Never  Scientific laboratory technician Use: Never used  Substance and Sexual Activity   Alcohol use: Not Currently    Comment: Glass of wine once a year   Drug use: Never   Sexual activity: Not on file  Other Topics Concern   Not on file  Social History Narrative   Not on file   Social Determinants of Health   Financial Resource Strain: Not on file  Food Insecurity: Not on file  Transportation Needs: Not on file  Physical Activity: Not on file  Stress: Not on file  Social Connections: Not on file  Intimate Partner Violence: Not on file   Social History   Tobacco Use  Smoking  Status Never  Smokeless Tobacco Never   Social History   Substance and Sexual Activity  Alcohol Use Not Currently   Comment: Glass of wine once a year   Family History:  Family History  Problem Relation Age of Onset   Diabetes Mother    Hypertension Mother    Heart disease Mother    Heart disease Father    Hypertension Father    Stroke Sister    Heart disease Sister    Hypertension Sister  Kidney failure Sister    Stroke Brother    Heart disease Brother    Hypertension Brother    Heart disease Brother    Hypertension Brother        Objective:    BP 117/72   Pulse 86   Ht 5' 2"  (1.575 m)   Wt 160 lb 9.6 oz (72.8 kg)   SpO2 96%   BMI 29.37 kg/m   Wt Readings from Last 3 Encounters:  02/20/22 160 lb 9.6 oz (72.8 kg)  12/04/21 168 lb 12.8 oz (76.6 kg)  11/19/21 172 lb 9.6 oz (78.3 kg)    Physical Exam Vitals and nursing note reviewed.  Constitutional:      General: She is not in acute distress.    Appearance: Normal appearance.  HENT:     Head: Normocephalic and atraumatic.     Right Ear: Hearing, tympanic membrane, ear canal and external ear normal.     Left Ear: Hearing, tympanic membrane, ear canal and external ear normal.     Nose: Nose normal.     Right Sinus: No maxillary sinus tenderness or frontal sinus tenderness.     Left Sinus: No maxillary sinus tenderness or frontal sinus tenderness.     Mouth/Throat:     Lips: Pink.     Mouth: Mucous membranes are moist.     Pharynx: Oropharynx is clear.  Eyes:     General: Lids are normal. Vision grossly intact.     Extraocular Movements: Extraocular movements intact.     Right eye: Nystagmus present.     Left eye: Nystagmus present.     Conjunctiva/sclera: Conjunctivae normal.     Pupils: Pupils are equal, round, and reactive to light.     Funduscopic exam:    Right eye: Red reflex present.        Left eye: Red reflex present.    Visual Fields: Right eye visual fields normal and left eye visual fields  normal.     Comments: Chronic nystagmus  Neck:     Thyroid: No thyromegaly.     Vascular: No carotid bruit.  Cardiovascular:     Rate and Rhythm: Normal rate and regular rhythm.     Chest Wall: PMI is not displaced.     Pulses: Normal pulses.          Dorsalis pedis pulses are 2+ on the right side and 2+ on the left side.       Posterior tibial pulses are 2+ on the right side and 2+ on the left side.     Heart sounds: Normal heart sounds. No murmur heard. Pulmonary:     Effort: Pulmonary effort is normal. No respiratory distress.     Breath sounds: Normal breath sounds.  Abdominal:     General: Abdomen is flat. Bowel sounds are normal. There is no distension.     Palpations: Abdomen is soft. There is no hepatomegaly, splenomegaly or mass.     Tenderness: There is no abdominal tenderness. There is no right CVA tenderness, left CVA tenderness, guarding or rebound.  Musculoskeletal:        General: Normal range of motion.     Cervical back: Full passive range of motion without pain, normal range of motion and neck supple. No tenderness.     Right lower leg: No edema.     Left lower leg: No edema.  Lymphadenopathy:     Cervical: No cervical adenopathy.     Upper Body:  Right upper body: No supraclavicular adenopathy.     Left upper body: No supraclavicular adenopathy.  Skin:    General: Skin is warm and dry.     Capillary Refill: Capillary refill takes less than 2 seconds.     Nails: There is no clubbing.  Neurological:     General: No focal deficit present.     Mental Status: She is alert and oriented to person, place, and time.     GCS: GCS eye subscore is 4. GCS verbal subscore is 5. GCS motor subscore is 6.     Sensory: Sensation is intact.     Motor: Motor function is intact. No weakness.     Coordination: Coordination is intact.     Gait: Gait is intact. Gait normal.     Deep Tendon Reflexes: Reflexes are normal and symmetric.  Psychiatric:        Attention and  Perception: Attention normal.        Mood and Affect: Mood normal.        Speech: Speech normal.        Behavior: Behavior normal. Behavior is cooperative.        Thought Content: Thought content normal.        Cognition and Memory: Cognition and memory normal.        Judgment: Judgment normal.    Results for orders placed or performed in visit on 02/20/22  Hemoglobin A1c  Result Value Ref Range   Hgb A1c MFr Bld 5.7 (H) 4.8 - 5.6 %   Est. average glucose Bld gHb Est-mCnc 117 mg/dL  VITAMIN D 25 Hydroxy (Vit-D Deficiency, Fractures)  Result Value Ref Range   Vit D, 25-Hydroxy 30.1 30.0 - 100.0 ng/mL  TSH  Result Value Ref Range   TSH 3.190 0.450 - 4.500 uIU/mL  Lipid panel  Result Value Ref Range   Cholesterol, Total 189 100 - 199 mg/dL   Triglycerides 101 0 - 149 mg/dL   HDL 53 >39 mg/dL   VLDL Cholesterol Cal 18 5 - 40 mg/dL   LDL Chol Calc (NIH) 118 (H) 0 - 99 mg/dL   Chol/HDL Ratio 3.6 0.0 - 4.4 ratio  Comprehensive metabolic panel  Result Value Ref Range   Glucose 109 (H) 70 - 99 mg/dL   BUN 12 8 - 27 mg/dL   Creatinine, Ser 0.78 0.57 - 1.00 mg/dL   eGFR 83 >59 mL/min/1.73   BUN/Creatinine Ratio 15 12 - 28   Sodium 138 134 - 144 mmol/L   Potassium 3.2 (L) 3.5 - 5.2 mmol/L   Chloride 97 96 - 106 mmol/L   CO2 26 20 - 29 mmol/L   Calcium 10.1 8.7 - 10.3 mg/dL   Total Protein 7.4 6.0 - 8.5 g/dL   Albumin 4.1 3.8 - 4.8 g/dL   Globulin, Total 3.3 1.5 - 4.5 g/dL   Albumin/Globulin Ratio 1.2 1.2 - 2.2   Bilirubin Total 0.4 0.0 - 1.2 mg/dL   Alkaline Phosphatase 93 44 - 121 IU/L   AST 19 0 - 40 IU/L   ALT 17 0 - 32 IU/L  CBC with Differential/Platelet  Result Value Ref Range   WBC 10.5 3.4 - 10.8 x10E3/uL   RBC 4.96 3.77 - 5.28 x10E6/uL   Hemoglobin 14.0 11.1 - 15.9 g/dL   Hematocrit 42.1 34.0 - 46.6 %   MCV 85 79 - 97 fL   MCH 28.2 26.6 - 33.0 pg   MCHC 33.3 31.5 - 35.7 g/dL   RDW  13.7 11.7 - 15.4 %   Platelets 376 150 - 450 x10E3/uL   Neutrophils 55 Not Estab.  %   Lymphs 28 Not Estab. %   Monocytes 9 Not Estab. %   Eos 6 Not Estab. %   Basos 2 Not Estab. %   Neutrophils Absolute 5.8 1.4 - 7.0 x10E3/uL   Lymphocytes Absolute 2.9 0.7 - 3.1 x10E3/uL   Monocytes Absolute 1.0 (H) 0.1 - 0.9 x10E3/uL   EOS (ABSOLUTE) 0.6 (H) 0.0 - 0.4 x10E3/uL   Basophils Absolute 0.2 0.0 - 0.2 x10E3/uL   Immature Granulocytes 0 Not Estab. %   Immature Grans (Abs) 0.0 0.0 - 0.1 x10E3/uL      Assessment & Plan:   Problem List Items Addressed This Visit     Encounter for annual physical exam - Primary   Relevant Orders   Hemoglobin A1c (Completed)   VITAMIN D 25 Hydroxy (Vit-D Deficiency, Fractures) (Completed)   TSH (Completed)   Lipid panel (Completed)   Comprehensive metabolic panel (Completed)   CBC with Differential/Platelet (Completed)   Anxiety and depression    Chronic.  No alarm symptoms present today.  Patient is not currently on any medication management for mood.  Is currently taking clonazepam for chronic vertigo and gabapentin for neuropathic pain which are likely helping with some of her symptoms.  Recommend close monitoring and follow-up.  Patient is aware to contact the office at any point if she feels her symptoms are worsening or if she would like to begin alternative medication therapies.  We will continue to monitor.       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  Previous antibiotic treatment provided with improvement of symptoms.  At this time she is unable to get in with the dentist and therefore we will begin treatment today.  No red flags or systemic symptoms are present. Encourage patient to contact the dentist as soon as possible for evaluation and  recommendations for management.       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  Patient is aware that likely symptoms will not resolve and are chronic in nature at this time.  Encourage patient to continue with close monitoring and medications to help with symptoms. We have tried steroids on multiple occasions without resolution. Monitor for new or worsening symptoms and report immediately.       Sicca (HCC)    Chronic.  Etiology unknown at this time.  She has been tested for autoimmune concerns which have come back negative.  Unfortunately chronic dry mouth has led to recurrent oral infections along the gumline.  Encourage patient to continue with oral rehydration solutions and monitor closely.  We will send treatment today for oral infection.  We will also send treatment with chlorhexidine mouth rinse to help reduce the bacterial presence in the mouth. Follow-up if symptoms worsen or fail to improve.       Relevant Orders   Hemoglobin A1c (Completed)   VITAMIN D 25 Hydroxy (Vit-D Deficiency, Fractures) (Completed)   TSH (Completed)   Lipid panel (Completed)  Comprehensive metabolic panel (Completed)   CBC with Differential/Platelet (Completed)   B12 deficiency    Labs today for further evaluation.  We will make changes to plan of care based on findings.       Relevant Orders   Hemoglobin A1c (Completed)   VITAMIN D 25 Hydroxy (Vit-D Deficiency, Fractures) (Completed)   TSH (Completed)   Lipid panel (Completed)   Comprehensive metabolic panel (Completed)   CBC with Differential/Platelet (Completed)   Colicky RUQ abdominal pain   Relevant Orders   US Abdomen Limited RUQ (LIVER/GB)   Other Visit Diagnoses     Need for shingles vaccine       Vitamin D deficiency       Relevant Medications   Vitamin D, Ergocalciferol,  (DRISDOL) 1.25 MG (50000 UNIT) CAPS capsule   Hypokalemia       Relevant Orders   Comprehensive metabolic panel         IMMUNIZATIONS:   - Tdap: Tetanus vaccination status reviewed: tetanus status unknown to the patient. - Influenza: Postponed to flu season - Pneumovax: Up to date - Prevnar: Up to date - HPV: Not applicable - Zostavax vaccine:  Prescription provided today.  SCREENING: - Pap smear: done elsewhere - STI testing: deferred -Mammogram: Up to date  - Colonoscopy: Up to date - last one in 2018  - Bone Density: Ordered today  -Hearing Test: Not applicable  -Spirometry: Not applicable   Follow up plan: Return in about 6 months (around 08/23/2022) for Hypothyroid.  NEXT PREVENTATIVE PHYSICAL DUE IN 1 YEAR.  PATIENT COUNSELING PROVIDED:   For all adult patients, I recommend   A well balanced diet low in saturated fats, cholesterol, and moderation in carbohydrates.   This can be as simple as monitoring portion sizes and cutting back on sugary beverages such as soda and juice to start with.    Daily water consumption of at least 64 ounces.  Physical activity at least 180 minutes per week, if just starting out.   This can be as simple as taking the stairs instead of the elevator and walking 2-3 laps around the office  purposefully every day.   STD protection, partner selection, and regular testing if high risk.  Limited consumption of alcoholic beverages if alcohol is consumed.  For women, I recommend no more than 7 alcoholic beverages per week, spread out throughout the week.  Avoid "binge" drinking or consuming large quantities of alcohol in one setting.   Please let me know if you feel you may need help with reduction or quitting alcohol consumption.   Avoidance of nicotine, if used.  Please let me know if you feel you may need help with reduction or quitting nicotine use.   Daily mental health attention.  This can be in the form of 5 minute daily  meditation, prayer, journaling, yoga, reflection, etc.   Purposeful attention to your emotions and mental state can significantly improve your overall wellbeing and Health.  Please know that I am here to help you with all of your health care goals and am happy to work with you to find a solution that works best for you.  The greatest advice I have received with any changes in life are to take it one step at a time, that even means if all you can focus on is the next 60 seconds, then do that and celebrate your victories.  With any changes in life, you will have set backs, and that is OK. The important  thing to remember is, if you have a set back, it is not a failure, it is an opportunity to try again!  Health Maintenance Recommendations Screening Testing Mammogram Every 1 -2 years based on history and risk factors Starting at age 76 Pap Smear Ages 21-39 every 3 years Ages 56-65 every 5 years with HPV testing More frequent testing may be required based on results and history Colon Cancer Screening Every 1-10 years based on test performed, risk factors, and history Starting at age 904 Bone Density Screening Every 2-10 years based on history Starting at age 73 for women Recommendations for men differ based on medication usage, history, and risk factors AAA Screening One time ultrasound Men 39-91 years old who have every smoked Lung Cancer Screening Low Dose Lung CT every 12 months Age 58-80 years with a 30 pack-year smoking history who still smoke or who have quit within the last 15 years  Screening Labs Routine  Labs: Complete Blood Count (CBC), Complete Metabolic Panel (CMP), Cholesterol (Lipid Panel) Every 6-12 months based on history and medications May be recommended more frequently based on current conditions or previous results Hemoglobin A1c Lab Every 3-12 months based on history and previous results Starting at age 63 or earlier with diagnosis of diabetes, high cholesterol,  BMI >26, and/or risk factors Frequent monitoring for patients with diabetes to ensure blood sugar control Thyroid Panel (TSH w/ T3 & T4) Every 6 months based on history, symptoms, and risk factors May be repeated more often if on medication HIV One time testing for all patients 57 and older May be repeated more frequently for patients with increased risk factors or exposure Hepatitis C One time testing for all patients 69 and older May be repeated more frequently for patients with increased risk factors or exposure Gonorrhea, Chlamydia Every 12 months for all sexually active persons 13-24 years Additional monitoring may be recommended for those who are considered high risk or who have symptoms PSA Men 16-74 years old with risk factors Additional screening may be recommended from age 38-69 based on risk factors, symptoms, and history  Vaccine Recommendations Tetanus Booster All adults every 10 years Flu Vaccine All patients 6 months and older every year COVID Vaccine All patients 12 years and older Initial dosing with booster May recommend additional booster based on age and health history HPV Vaccine 2 doses all patients age 90-26 Dosing may be considered for patients over 26 Shingles Vaccine (Shingrix) 2 doses all adults 29 years and older Pneumonia (Pneumovax 23) All adults 57 years and older May recommend earlier dosing based on health history Pneumonia (Prevnar 68) All adults 65 years and older Dosed 1 year after Pneumovax 23  Additional Screening, Testing, and Vaccinations may be recommended on an individualized basis based on family history, health history, risk factors, and/or exposure.

## 2022-02-21 LAB — CBC WITH DIFFERENTIAL/PLATELET
Basophils Absolute: 0.2 10*3/uL (ref 0.0–0.2)
Basos: 2 %
EOS (ABSOLUTE): 0.6 10*3/uL — ABNORMAL HIGH (ref 0.0–0.4)
Eos: 6 %
Hematocrit: 42.1 % (ref 34.0–46.6)
Hemoglobin: 14 g/dL (ref 11.1–15.9)
Immature Grans (Abs): 0 10*3/uL (ref 0.0–0.1)
Immature Granulocytes: 0 %
Lymphocytes Absolute: 2.9 10*3/uL (ref 0.7–3.1)
Lymphs: 28 %
MCH: 28.2 pg (ref 26.6–33.0)
MCHC: 33.3 g/dL (ref 31.5–35.7)
MCV: 85 fL (ref 79–97)
Monocytes Absolute: 1 10*3/uL — ABNORMAL HIGH (ref 0.1–0.9)
Monocytes: 9 %
Neutrophils Absolute: 5.8 10*3/uL (ref 1.4–7.0)
Neutrophils: 55 %
Platelets: 376 10*3/uL (ref 150–450)
RBC: 4.96 x10E6/uL (ref 3.77–5.28)
RDW: 13.7 % (ref 11.7–15.4)
WBC: 10.5 10*3/uL (ref 3.4–10.8)

## 2022-02-21 LAB — COMPREHENSIVE METABOLIC PANEL
ALT: 17 IU/L (ref 0–32)
AST: 19 IU/L (ref 0–40)
Albumin/Globulin Ratio: 1.2 (ref 1.2–2.2)
Albumin: 4.1 g/dL (ref 3.8–4.8)
Alkaline Phosphatase: 93 IU/L (ref 44–121)
BUN/Creatinine Ratio: 15 (ref 12–28)
BUN: 12 mg/dL (ref 8–27)
Bilirubin Total: 0.4 mg/dL (ref 0.0–1.2)
CO2: 26 mmol/L (ref 20–29)
Calcium: 10.1 mg/dL (ref 8.7–10.3)
Chloride: 97 mmol/L (ref 96–106)
Creatinine, Ser: 0.78 mg/dL (ref 0.57–1.00)
Globulin, Total: 3.3 g/dL (ref 1.5–4.5)
Glucose: 109 mg/dL — ABNORMAL HIGH (ref 70–99)
Potassium: 3.2 mmol/L — ABNORMAL LOW (ref 3.5–5.2)
Sodium: 138 mmol/L (ref 134–144)
Total Protein: 7.4 g/dL (ref 6.0–8.5)
eGFR: 83 mL/min/{1.73_m2} (ref >59)

## 2022-02-21 LAB — LIPID PANEL
Chol/HDL Ratio: 3.6 ratio (ref 0.0–4.4)
Cholesterol, Total: 189 mg/dL (ref 100–199)
HDL: 53 mg/dL (ref >39)
LDL Chol Calc (NIH): 118 mg/dL — ABNORMAL HIGH (ref 0–99)
Triglycerides: 101 mg/dL (ref 0–149)
VLDL Cholesterol Cal: 18 mg/dL (ref 5–40)

## 2022-02-21 LAB — HEMOGLOBIN A1C
Est. average glucose Bld gHb Est-mCnc: 117 mg/dL
Hgb A1c MFr Bld: 5.7 % — ABNORMAL HIGH (ref 4.8–5.6)

## 2022-02-21 LAB — TSH: TSH: 3.19 u[IU]/mL (ref 0.450–4.500)

## 2022-02-21 LAB — VITAMIN D 25 HYDROXY (VIT D DEFICIENCY, FRACTURES): Vit D, 25-Hydroxy: 30.1 ng/mL (ref 30.0–100.0)

## 2022-02-22 ENCOUNTER — Other Ambulatory Visit (HOSPITAL_COMMUNITY): Payer: Self-pay

## 2022-02-22 MED ORDER — VITAMIN D (ERGOCALCIFEROL) 1.25 MG (50000 UNIT) PO CAPS
50000.0000 [IU] | ORAL_CAPSULE | ORAL | 0 refills | Status: DC
  Filled 2022-02-22: qty 12, 84d supply, fill #0

## 2022-02-23 IMAGING — CT CT HEAD W/O CM
4 series · 16 of 47 positions shown, 18 images · non-contrast
Comparison: No pertinent prior exams available for comparison.

CLINICAL DATA: Neuro deficit, acute, stroke suspected; facial
droop. Additional history provided: Patient reports having recent
infection to the right ear and being treated with antibiotics,
right-sided facial droop onset [REDACTED], difficulty closing right
eye, headache.

EXAM:
CT HEAD WITHOUT CONTRAST
TECHNIQUE: Contiguous axial images were obtained from the base of the skull
through the vertex without intravenous contrast.

[Series 3: head wo · axial · 0.39mm/px · z∈[+1228,+1348]mm · 7 of 33 slices shown, 9 images]
[im 5/33  brain]
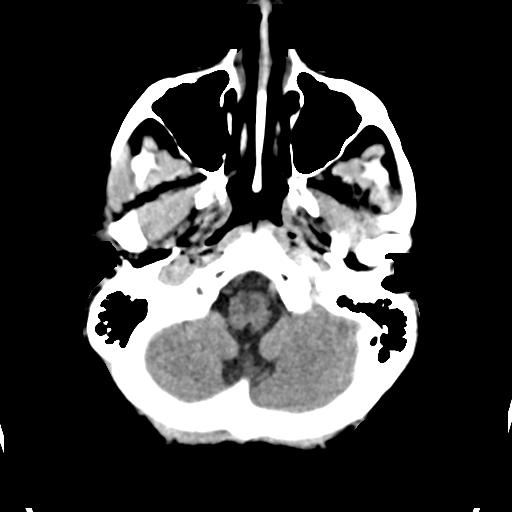
[im 5/33  bone]
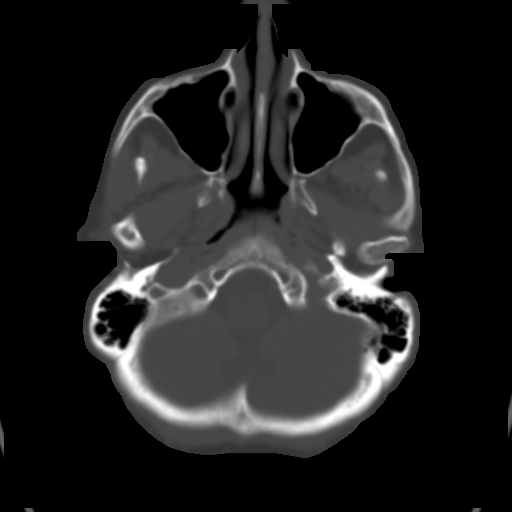
[im 9/33  brain]
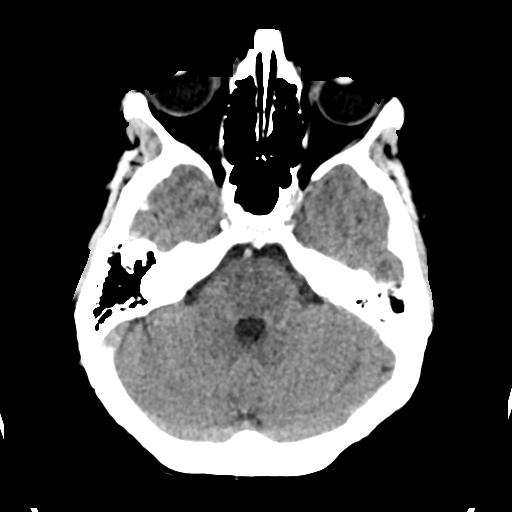
[im 13/33  brain]
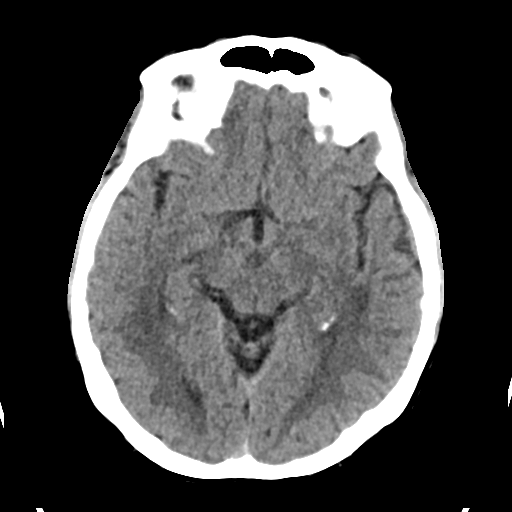
[im 17/33  brain]
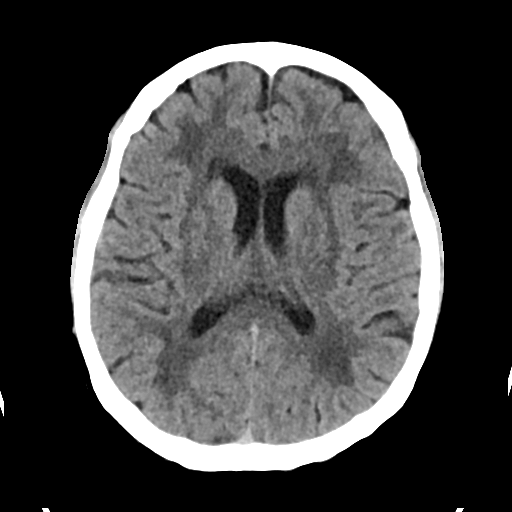
[im 21/33  brain]
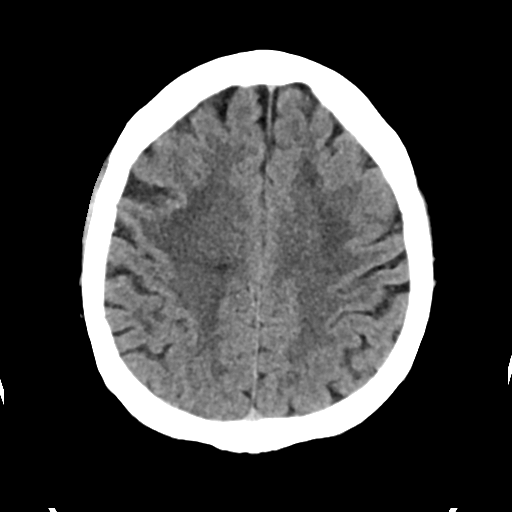
[im 21/33  bone]
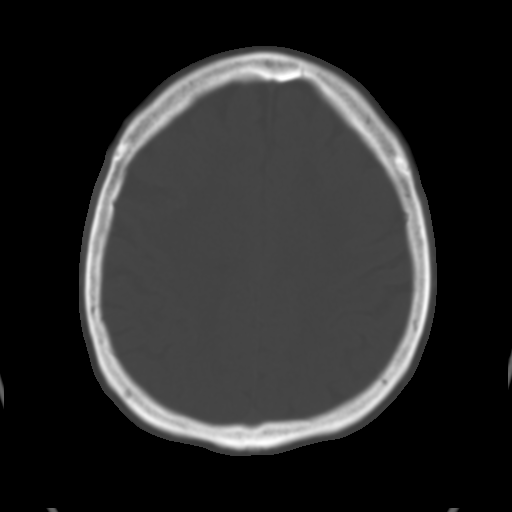
[im 25/33  brain]
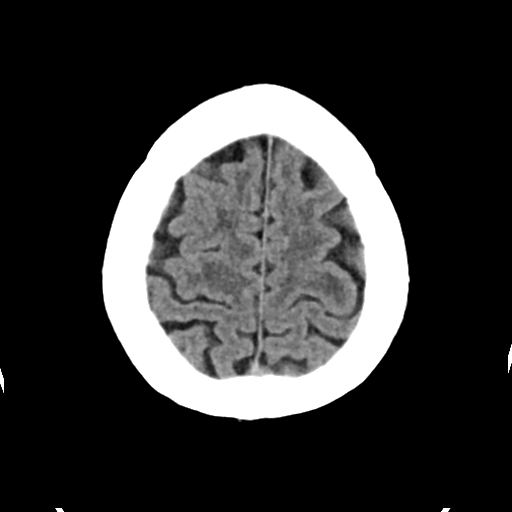
[im 29/33  brain]
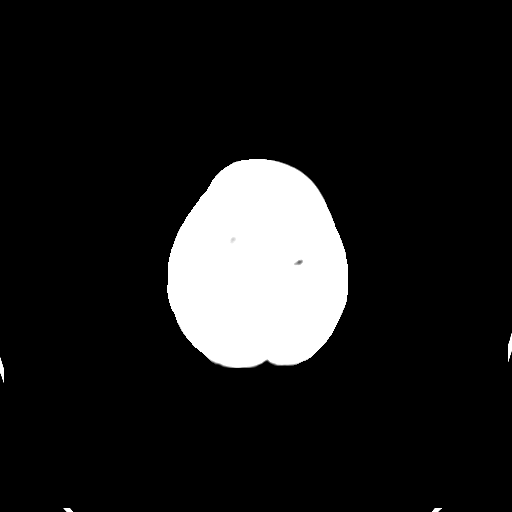

[Series 4: head bone · axial · 0.39mm/px · z∈[+1224,+1256]mm · 3 of 81 slices shown]
[im 9/81  bone]
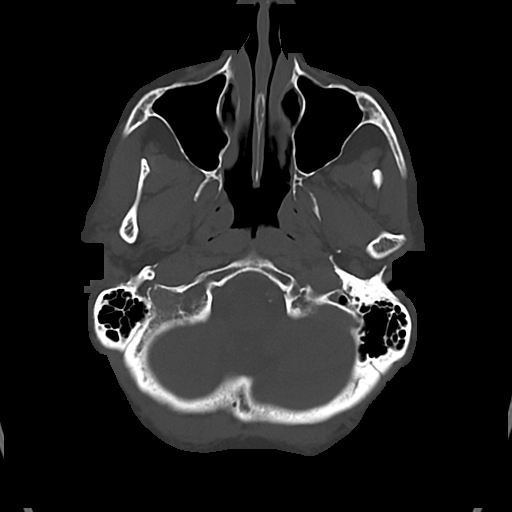
[im 17/81  bone]
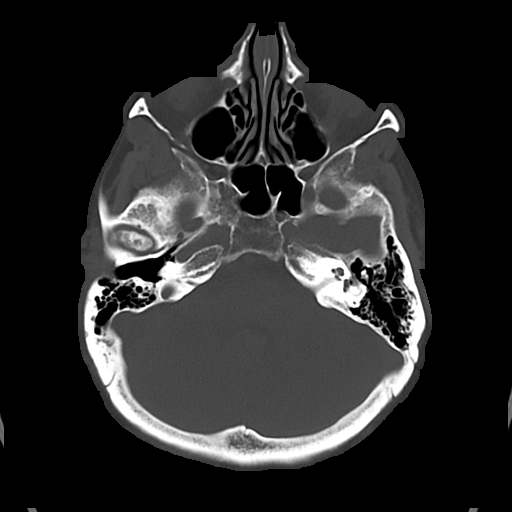
[im 25/81  bone]
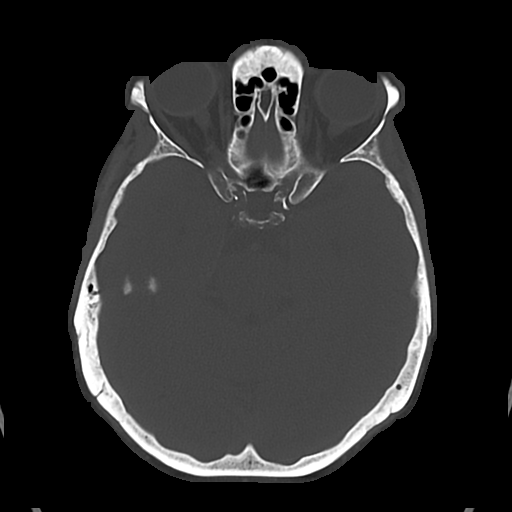

[Series 5: cor soft · coronal · 0.31mm/px · 3 of 67 slices shown]
[im 23/67  brain]
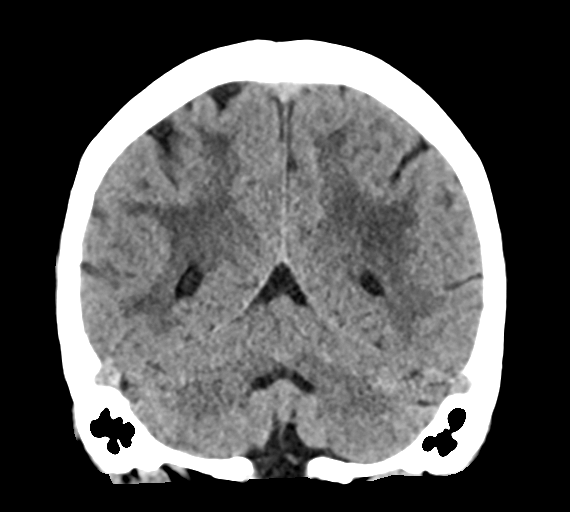
[im 30/67  brain]
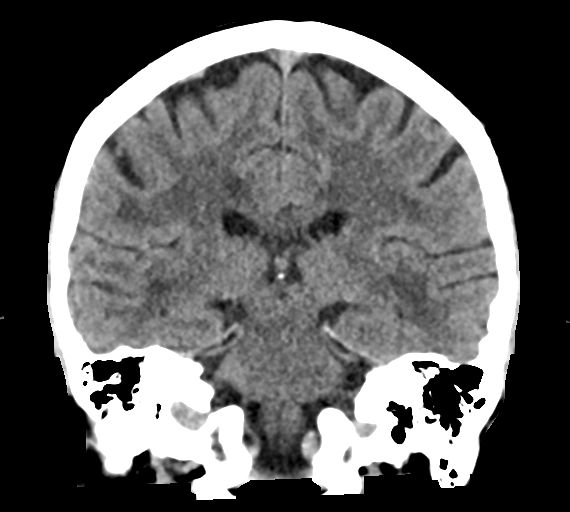
[im 37/67  brain]
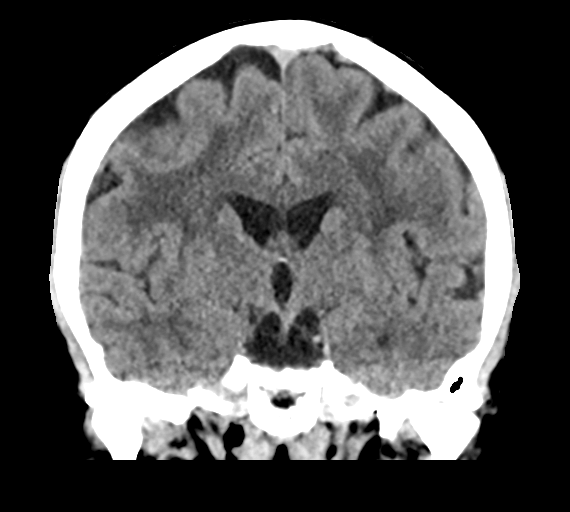

[Series 6: sag soft · sagittal · 0.30mm/px · 3 of 60 slices shown]
[im 20/60  brain]
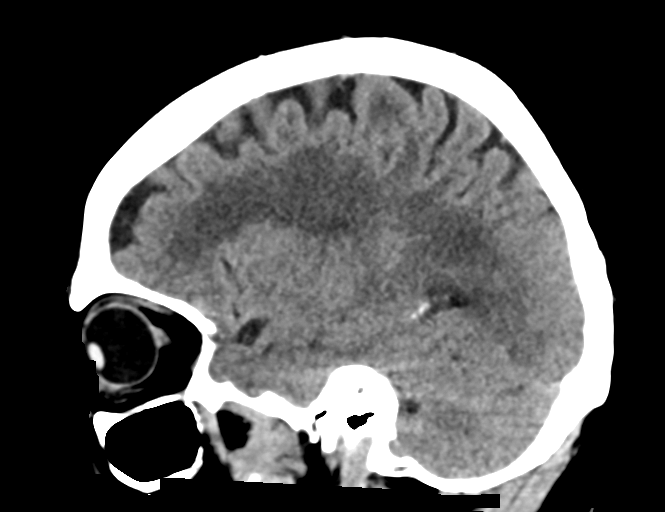
[im 30/60  brain]
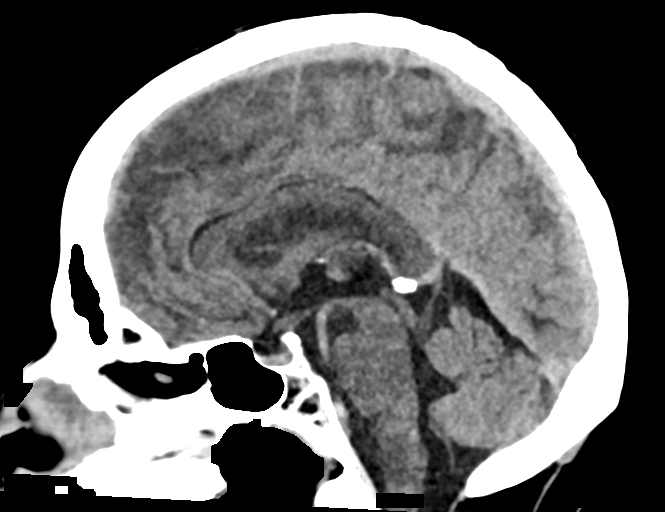
[im 40/60  brain]
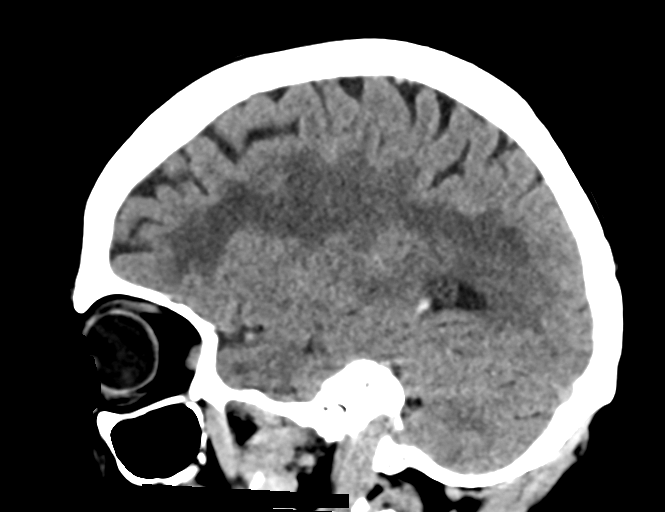

[16 of 47 positions shown; findings below may reference images not displayed]

FINDINGS: Brain:

Cerebral volume is normal for age.

Patchy and ill-defined hypoattenuation within the cerebral white
matter is nonspecific, but most often secondary to chronic small
vessel ischemia.

There is no acute intracranial hemorrhage.

No demarcated cortical infarct.

No extra-axial fluid collection.

No evidence of intracranial mass.

No midline shift.

Vascular: No hyperdense vessel. Atherosclerotic calcifications.

Skull: Normal. Negative for fracture or focal lesion.

Sinuses/Orbits: Visualized orbits show no acute finding. No
significant paranasal sinus disease at the imaged levels.

Other: No appreciable significant middle ear or mastoid effusion.
IMPRESSION: No evidence of acute intracranial abnormality.

Advanced cerebral white matter disease, nonspecific but most often
secondary to chronic small vessel ischemia.

## 2022-02-24 DIAGNOSIS — R1011 Right upper quadrant pain: Secondary | ICD-10-CM | POA: Insufficient documentation

## 2022-02-24 DIAGNOSIS — E538 Deficiency of other specified B group vitamins: Secondary | ICD-10-CM

## 2022-02-24 HISTORY — DX: Deficiency of other specified B group vitamins: E53.8

## 2022-02-24 HISTORY — DX: Right upper quadrant pain: R10.11

## 2022-02-24 NOTE — Assessment & Plan Note (Signed)
Labs today for further evaluation.  We will make changes to plan of care based on findings.

## 2022-02-24 NOTE — Assessment & Plan Note (Signed)
Chronic.  Etiology unknown at this time.  She has been tested for autoimmune concerns which have come back negative.  Unfortunately chronic dry mouth has led to recurrent oral infections along the gumline.  Encourage patient to continue with oral rehydration solutions and monitor closely.  We will send treatment today for oral infection.  We will also send treatment with chlorhexidine mouth rinse to help reduce the bacterial presence in the mouth. Follow-up if symptoms worsen or fail to improve.

## 2022-02-24 NOTE — Assessment & Plan Note (Signed)
Chronic.  Status post Ramsay Hunt syndrome.  Patient is aware that likely symptoms will not resolve and are chronic in nature at this time.  Encourage patient to continue with close monitoring and medications to help with symptoms. We have tried steroids on multiple occasions without resolution. Monitor for new or worsening symptoms and report immediately.

## 2022-02-24 NOTE — Assessment & Plan Note (Signed)
Chronic.  No alarm symptoms present today.  Patient is not currently on any medication management for mood.  Is currently taking clonazepam for chronic vertigo and gabapentin for neuropathic pain which are likely helping with some of her symptoms.  Recommend close monitoring and follow-up.  Patient is aware to contact the office at any point if she feels her symptoms are worsening or if she would like to begin alternative medication therapies.  We will continue to monitor.

## 2022-02-24 NOTE — Assessment & Plan Note (Signed)
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  Previous antibiotic treatment provided with improvement of symptoms.  At this time she is unable to get in with the dentist and therefore we will begin treatment today.  No red flags or systemic symptoms are present. Encourage patient to contact the dentist as soon as possible for evaluation and recommendations for management.

## 2022-02-25 ENCOUNTER — Other Ambulatory Visit (HOSPITAL_BASED_OUTPATIENT_CLINIC_OR_DEPARTMENT_OTHER): Payer: Self-pay | Admitting: Nurse Practitioner

## 2022-02-25 ENCOUNTER — Ambulatory Visit
Admission: RE | Admit: 2022-02-25 | Discharge: 2022-02-25 | Disposition: A | Discharge status: Home / Self Care | Payer: BC Managed Care – PPO | Source: Ambulatory Visit | Attending: Nurse Practitioner | Admitting: Nurse Practitioner

## 2022-02-25 ENCOUNTER — Other Ambulatory Visit (HOSPITAL_COMMUNITY): Payer: Self-pay

## 2022-02-25 ENCOUNTER — Encounter (HOSPITAL_BASED_OUTPATIENT_CLINIC_OR_DEPARTMENT_OTHER): Payer: Self-pay | Admitting: Nurse Practitioner

## 2022-02-25 DIAGNOSIS — K047 Periapical abscess without sinus: Secondary | ICD-10-CM

## 2022-02-25 DIAGNOSIS — R1011 Right upper quadrant pain: Secondary | ICD-10-CM

## 2022-02-25 MED ORDER — CLINDAMYCIN HCL 300 MG PO CAPS
300.0000 mg | ORAL_CAPSULE | Freq: Four times a day (QID) | ORAL | 0 refills | Status: AC
  Filled 2022-02-25: qty 28, 7d supply, fill #0

## 2022-02-26 ENCOUNTER — Other Ambulatory Visit (HOSPITAL_COMMUNITY): Payer: Self-pay

## 2022-03-03 ENCOUNTER — Encounter (HOSPITAL_BASED_OUTPATIENT_CLINIC_OR_DEPARTMENT_OTHER): Payer: Self-pay

## 2022-03-08 ENCOUNTER — Other Ambulatory Visit (HOSPITAL_COMMUNITY): Payer: Self-pay

## 2022-03-18 ENCOUNTER — Encounter (HOSPITAL_BASED_OUTPATIENT_CLINIC_OR_DEPARTMENT_OTHER): Payer: Self-pay | Admitting: Nurse Practitioner

## 2022-04-12 ENCOUNTER — Other Ambulatory Visit (HOSPITAL_BASED_OUTPATIENT_CLINIC_OR_DEPARTMENT_OTHER): Payer: Self-pay | Admitting: Nurse Practitioner

## 2022-04-12 DIAGNOSIS — B0221 Postherpetic geniculate ganglionitis: Secondary | ICD-10-CM

## 2022-04-15 ENCOUNTER — Other Ambulatory Visit (HOSPITAL_COMMUNITY): Payer: Self-pay

## 2022-04-16 ENCOUNTER — Other Ambulatory Visit (HOSPITAL_COMMUNITY): Payer: Self-pay

## 2022-04-16 ENCOUNTER — Other Ambulatory Visit (HOSPITAL_BASED_OUTPATIENT_CLINIC_OR_DEPARTMENT_OTHER): Payer: Self-pay | Admitting: Nurse Practitioner

## 2022-04-16 DIAGNOSIS — B0221 Postherpetic geniculate ganglionitis: Secondary | ICD-10-CM

## 2022-04-17 ENCOUNTER — Encounter (HOSPITAL_BASED_OUTPATIENT_CLINIC_OR_DEPARTMENT_OTHER): Payer: Self-pay | Admitting: Nurse Practitioner

## 2022-04-17 ENCOUNTER — Ambulatory Visit (INDEPENDENT_AMBULATORY_CARE_PROVIDER_SITE_OTHER): Payer: Medicare Other | Admitting: Nurse Practitioner

## 2022-04-17 ENCOUNTER — Other Ambulatory Visit (HOSPITAL_COMMUNITY): Payer: Self-pay

## 2022-04-17 VITALS — BP 126/69 | HR 67 | Ht 62.0 in | Wt 156.6 lb

## 2022-04-17 DIAGNOSIS — F4323 Adjustment disorder with mixed anxiety and depressed mood: Secondary | ICD-10-CM | POA: Diagnosis not present

## 2022-04-17 DIAGNOSIS — B0221 Postherpetic geniculate ganglionitis: Secondary | ICD-10-CM | POA: Diagnosis not present

## 2022-04-17 DIAGNOSIS — R413 Other amnesia: Secondary | ICD-10-CM | POA: Diagnosis not present

## 2022-04-17 MED ORDER — SERTRALINE HCL 50 MG PO TABS
ORAL_TABLET | ORAL | 3 refills | Status: DC
  Filled 2022-04-17: qty 24, 26d supply, fill #0
  Filled 2022-04-17: qty 6, 6d supply, fill #0
  Filled 2022-05-12: qty 30, 30d supply, fill #1

## 2022-04-17 NOTE — Progress Notes (Signed)
[name], DNP, AGNP-c [facility] [address] [place], [place] [address] [phone] Office [phone] Fax  ESTABLISHED PATIENT- Chronic Health and/or Follow-Up Visit  Blood pressure 126/69, pulse 67, height 5\' 2"  (1.575 m), weight 156 lb 9.6 oz (71 kg), SpO2 99 %.  Dizziness (Pt here for vertigo concerns, pt stated she may just have more on-going symptoms more than vertigo )   HPI  Julia Gutierrez  is a 66 y.o. year old female presenting today for evaluation and management of the following: Memory She tells me today that her children have requested she have cognitive evaluation for possible dementia. Her daughter who lives out of town spent time with her recently and noted some significant memory changes that concerned her. 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  I was able to speak with her daughter, 79, on the phone during the visit with the patients permission and patient present for conversation. She reports Changes are evident. She does not feel that she is necessarily forgetful, but she does seem to have increased difficulty staying focused on subjects. She tells me during a conversation, the pt will change subject like she didn't hear what they are saying. At one point she bought a coffee table in the middle of the night that she didn't remember buying. At time her mannerisms and behaviors are different for a period of time and then will switch back, they used to joke there was a "split personality", but it is concerning. Her emotions, expressions, the way she moves, etc are very different than previously (last saw 1.5 years ago). It is difficult for the pt to find the right words very often - more often than not. She has also noticed that her opinions change from day to day and sometimes seems like a different person. They have  noticed this in a matter of hours to days. She also notes that the patient is moodier than normal, frustrated easier than previously. She reports that none of the changes are catastrophic, but definitely noticeable to the family. She reports that this was "quite a shock" after not spending a significant amount of time with the patient in the last 1.5 years.  [name] tells me that she has noticed subtle changes in her cognition prior to the onset of her vertigo. She reports that she falls frequently. She also reports a significant impact on her mood due to the chronic nature of the dizziness. She is very unhappy that this is how her life is to remain. The medications she has been using are really not helpful. Therapy has not been helpful. She tells me that she does often have a hard time finding words. She also reports that some days are better than others. She does endorse depression with her current condition and she feels like this may be having an impact.  They both feel that her memory is normal, but there has been a significant switch in her behaviors and cognition.  ROM All ROS negative with exception of what is listed in HPI  PHYSICAL EXAM Physical Exam Vitals and nursing note reviewed.  Constitutional:      General: She is not in acute distress.    Appearance: Normal appearance.  HENT:     Head: Normocephalic and atraumatic.     Left Ear: Tympanic membrane normal.     Mouth/Throat:     Mouth:  Mucous membranes are moist.  Eyes:     Pupils: Pupils are equal, round, and reactive to light.  Neck:     Vascular: No carotid bruit.  Cardiovascular:     Rate and Rhythm: Normal rate and regular rhythm.     Pulses: Normal pulses.     Heart sounds: Normal heart sounds.  Pulmonary:     Effort: Pulmonary effort is normal.     Breath sounds: Normal breath sounds.  Musculoskeletal:        General: Normal range of motion.     Cervical back: No tenderness.  Lymphadenopathy:     Cervical: No  cervical adenopathy.  Skin:    General: Skin is warm and dry.     Capillary Refill: Capillary refill takes less than 2 seconds.  Neurological:     General: No focal deficit present.     Mental Status: She is alert and oriented to person, place, and time.     Cranial Nerves: No cranial nerve deficit.     Motor: No weakness.     Coordination: Coordination abnormal.     Gait: Gait abnormal.  Psychiatric:        Mood and Affect: Mood normal.        Behavior: Behavior normal.        Thought Content: Thought content normal.        Judgment: Judgment normal.     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 Unfortunately we have not found a treatment modality to help reduce her symptoms. This has most certainly negatively affected her life. She is aware that her symptoms are lifelong as there are no permanent treatment options available at this time. We will continue supportive care at this time.      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 Her condition has had a significant impact on her physical abilities and daily life, therefore it is understandable that this would have a negative impact on her mood. Unfortunately, treatments have been unsuccessful in management of her symptoms of the illness, which also has led to prolonged depression. Discussion with patient today about medication options that may be helpful for mood. It is unclear if the depression could be contributing to all of the concerns that she is having, but I do feel that they could be responsible for some. We will start with medication to see if we can get improvement on her mood and mental status and monitor for changes in areas of cognition and personality.        Relevant Orders   Ambulatory referral to Neurology   Memory changes    Changes noted in memory, focus, and cognition by family that are considerably concerning. The patient has noticed some of the changes herself. After discussion with both the patient and her family today, I do feel that further evaluation is warranted. It is possible that some of the changes are related to mood, which is understandable, but there is concern that there are additional factors contributing to the noted changes. I will send referral to neuro for further evaluation and recommendation. She may need neurocognitive testing to help clearly identify any areas of deficit. We are going to start treatment for mood today to see if  this is helpful. I do not see any alarm symptoms present today to indicate CVA, however, TIA's are unable to be ruled out at this time. Further imaging may be helpful.       Relevant Orders   Ambulatory referral to Neurology     FOLLOW-UP Return in about 4 weeks (around 05/15/2022) for phone call mood.   [name], DNP, AGNP-c 04/17/2022 10:29 AM

## 2022-04-17 NOTE — Patient Instructions (Addendum)
I am going to send the referral to the specialist for neurocognitive testing to see if we can tweeze out what is going on.   I have sent in the sertraline for you to try to see if we can get your mood a little more stable.

## 2022-04-19 MED ORDER — CLONAZEPAM 1 MG PO TABS: 1.0000 mg | ORAL_TABLET | Freq: Two times a day (BID) | ORAL | 2 refills | Status: DC | PRN

## 2022-05-01 ENCOUNTER — Encounter: Payer: Self-pay | Admitting: Physician Assistant

## 2022-05-01 ENCOUNTER — Ambulatory Visit (INDEPENDENT_AMBULATORY_CARE_PROVIDER_SITE_OTHER): Payer: Medicare Other | Admitting: Physician Assistant

## 2022-05-01 ENCOUNTER — Other Ambulatory Visit: Payer: Medicare Other

## 2022-05-01 VITALS — BP 135/82 | HR 72 | Ht 62.0 in | Wt 153.0 lb

## 2022-05-01 DIAGNOSIS — Z8673 Personal history of transient ischemic attack (TIA), and cerebral infarction without residual deficits: Secondary | ICD-10-CM | POA: Diagnosis not present

## 2022-05-01 DIAGNOSIS — R413 Other amnesia: Secondary | ICD-10-CM

## 2022-05-01 LAB — VITAMIN B12: Vitamin B-12: 232 pg/mL (ref 211–911)

## 2022-05-01 NOTE — Patient Instructions (Signed)
It was a pleasure to see you today at our office.   Recommendations:  MRI of the brain, the radiology office will call you to arrange you appointment Check labs today Follow up in 1 month    Whom to call:  Memory  decline, memory medications: Call our office [phone]   For psychiatric meds, mood meds: Please have your primary care physician manage these medications.   Counseling regarding caregiver distress, including caregiver depression, anxiety and issues regarding community resources, adult day care programs, adult living facilities, or memory care questions:   Feel free to contact K[place], Social Worker at [phone]   For assessment of decision of mental capacity and competency:  Call Dr. Anthoney Harada, geriatric psychiatrist at [phone]  For guidance in geriatric dementia issues please call [facility] [phone]  For guidance regarding [facility] and if placement were needed at the facility, contact Arnell Asal, Social Worker tel: [phone]  If you have any severe symptoms of a stroke, or other severe issues such as confusion,severe chills or fever, etc call 911 or go to the ER as you may need to be evaluated further        RECOMMENDATIONS FOR ALL PATIENTS WITH MEMORY PROBLEMS: 1. Continue to exercise (Recommend 30 minutes of walking everyday, or 3 hours every week) 2. Increase social interactions - continue going to [place] and enjoy social gatherings with friends and family 3. Eat healthy, avoid fried foods and eat more fruits and vegetables 4. Maintain adequate blood pressure, blood sugar, and blood cholesterol level. Reducing the risk of stroke and cardiovascular disease also helps promoting better memory. 5. Avoid stressful situations. Live a simple life and avoid aggravations. Organize your time and prepare for the next day in anticipation. 6. Sleep well, avoid any interruptions of sleep and avoid any  distractions in the bedroom that may interfere with adequate sleep quality 7. Avoid sugar, avoid sweets as there is a strong link between excessive sugar intake, diabetes, and cognitive impairment We discussed the Mediterranean diet, which has been shown to help patients reduce the risk of progressive memory disorders and reduces cardiovascular risk. This includes eating fish, eat fruits and green leafy vegetables, nuts like almonds and hazelnuts, walnuts, and also use olive oil. Avoid fast foods and fried foods as much as possible. Avoid sweets and sugar as sugar use has been linked to worsening of memory function.  There is always a concern of gradual progression of memory problems. If this is the case, then we may need to adjust level of care according to patient needs. Support, both to the patient and caregiver, should then be put into place.      You have been referred for a neuropsychological evaluation (i.e., evaluation of memory and thinking abilities). Please bring someone with you to this appointment if possible, as it is helpful for the doctor to hear from both you and another adult who knows you well. Please bring eyeglasses and hearing aids if you wear them.    The evaluation will take approximately 3 hours and has two parts:   The first part is a clinical interview with the neuropsychologist (Dr. Melvyn Novas or Dr. Nicole Kindred). During the interview, the neuropsychologist will speak with you and the individual you brought to the appointment.    The second part of the evaluation is testing with the doctor's technician Hinton Dyer or Maudie Mercury). During the testing, the technician will ask you to remember different types of material, solve problems,  and answer some questionnaires. Your family member will not be present for this portion of the evaluation.   Please note: We must reserve several hours of the neuropsychologist's time and the psychometrician's time for your evaluation appointment. As such, there is  a No-Show fee of $100. If you are unable to attend any of your appointments, please contact our office as soon as possible to reschedule.    FALL PRECAUTIONS: Be cautious when walking. Scan the area for obstacles that may increase the risk of trips and falls. When getting up in the mornings, sit up at the edge of the bed for a few minutes before getting out of bed. Consider elevating the bed at the head end to avoid drop of blood pressure when getting up. Walk always in a well-lit room (use night lights in the walls). Avoid area rugs or power cords from appliances in the middle of the walkways. Use a walker or a cane if necessary and consider physical therapy for balance exercise. Get your eyesight checked regularly.  FINANCIAL OVERSIGHT: Supervision, especially oversight when making financial decisions or transactions is also recommended.  HOME SAFETY: Consider the safety of the kitchen when operating appliances like stoves, microwave oven, and blender. Consider having supervision and share cooking responsibilities until no longer able to participate in those. Accidents with firearms and other hazards in the house should be identified and addressed as well.   ABILITY TO BE LEFT ALONE: If patient is unable to contact 911 operator, consider using LifeLine, or when the need is there, arrange for someone to stay with patients. Smoking is a fire hazard, consider supervision or cessation. Risk of wandering should be assessed by caregiver and if detected at any point, supervision and safe proof recommendations should be instituted.  MEDICATION SUPERVISION: Inability to self-administer medication needs to be constantly addressed. Implement a mechanism to ensure safe administration of the medications.   DRIVING: Regarding driving, in patients with progressive memory problems, driving will be impaired. We advise to have someone else do the driving if trouble finding directions or if minor accidents are  reported. Independent driving assessment is available to determine safety of driving.   If you are interested in the driving assessment, you can contact the following:  [employer] in [place]  [place] [phone] or [phone]    [place] refers to food and lifestyle choices that are based on the traditions of countries located on the [employer]. This way of eating has been shown to help prevent certain conditions and improve outcomes for people who have chronic diseases, like kidney disease and heart disease. What are tips for following this plan? Lifestyle  Cook and eat meals together with your family, when possible. Drink enough fluid to keep your urine clear or pale yellow. Be physically active every day. This includes: Aerobic exercise like running or swimming. Leisure activities like gardening, walking, or housework. Get 7-8 hours of sleep each night. If recommended by your health care provider, drink red wine in moderation. This means 1 glass a day for nonpregnant women and 2 glasses a day for men. A glass of wine equals 5 oz (150 mL). Reading food labels  Check the serving size of packaged foods. For foods such as rice and pasta, the serving size refers to the amount of cooked product, not dry. Check the total fat in packaged foods. Avoid foods that have saturated fat  or trans fats. Check the ingredients list for added sugars, such as corn syrup. Shopping  At the grocery store, buy most of your food from the areas near the walls of the store. This includes: Fresh fruits and vegetables (produce). Grains, beans, nuts, and seeds. Some of these may be available in unpackaged forms or large amounts (in bulk). Fresh seafood. Poultry and eggs. Low-fat dairy products. Buy whole ingredients instead of prepackaged  foods. Buy fresh fruits and vegetables in-season from local farmers markets. Buy frozen fruits and vegetables in resealable bags. If you do not have access to quality fresh seafood, buy precooked frozen shrimp or canned fish, such as tuna, salmon, or sardines. Buy small amounts of raw or cooked vegetables, salads, or olives from the deli or salad bar at your store. Stock your pantry so you always have certain foods on hand, such as olive oil, canned tuna, canned tomatoes, rice, pasta, and beans. Cooking  Cook foods with extra-virgin olive oil instead of using butter or other vegetable oils. Have meat as a side dish, and have vegetables or grains as your main dish. This means having meat in small portions or adding small amounts of meat to foods like pasta or stew. Use beans or vegetables instead of meat in common dishes like chili or lasagna. Experiment with different cooking methods. Try roasting or broiling vegetables instead of steaming or sauteing them. Add frozen vegetables to soups, stews, pasta, or rice. Add nuts or seeds for added healthy fat at each meal. You can add these to yogurt, salads, or vegetable dishes. Marinate fish or vegetables using olive oil, lemon juice, garlic, and fresh herbs. Meal planning  Plan to eat 1 vegetarian meal one day each week. Try to work up to 2 vegetarian meals, if possible. Eat seafood 2 or more times a week. Have healthy snacks readily available, such as: Vegetable sticks with hummus. Greek yogurt. Fruit and nut trail mix. Eat balanced meals throughout the week. This includes: Fruit: 2-3 servings a day Vegetables: 4-5 servings a day Low-fat dairy: 2 servings a day Fish, poultry, or lean meat: 1 serving a day Beans and legumes: 2 or more servings a week Nuts and seeds: 1-2 servings a day Whole grains: 6-8 servings a day Extra-virgin olive oil: 3-4 servings a day Limit red meat and sweets to only a few servings a month What are my food  choices? Mediterranean diet Recommended Grains: Whole-grain pasta. Brown rice. Bulgar wheat. Polenta. Couscous. Whole-wheat bread. Orpah Cobb. Vegetables: Artichokes. Beets. Broccoli. Cabbage. Carrots. Eggplant. Green beans. Chard. Kale. Spinach. Onions. Leeks. Peas. Squash. Tomatoes. Peppers. Radishes. Fruits: Apples. Apricots. Avocado. Berries. Bananas. Cherries. Dates. Figs. Grapes. Lemons. Melon. Oranges. Peaches. Plums. Pomegranate. Meats and other protein foods: Beans. Almonds. Sunflower seeds. Pine nuts. Peanuts. Cod. Salmon. Scallops. Shrimp. Tuna. Tilapia. Clams. Oysters. Eggs. Dairy: Low-fat milk. Cheese. Greek yogurt. Beverages: Water. Red wine. Herbal tea. Fats and oils: Extra virgin olive oil. Avocado oil. Grape seed oil. Sweets and desserts: [place] yogurt with honey. Baked apples. Poached pears. Trail mix. Seasoning and other foods: Basil. Cilantro. Coriander. Cumin. Mint. Parsley. Sage. Rosemary. Tarragon. Garlic. Oregano. Thyme. Pepper. Balsalmic vinegar. Tahini. Hummus. Tomato sauce. Olives. Mushrooms. Limit these Grains: Prepackaged pasta or rice dishes. Prepackaged cereal with added sugar. Vegetables: Deep fried potatoes (french fries). Fruits: Fruit canned in syrup. Meats and other protein foods: Beef. Pork. Lamb. Poultry with skin. Hot dogs. Tomasa Blase. Dairy: Ice cream. Sour cream. Whole milk. Beverages: Juice. Sugar-sweetened soft drinks. Beer. Liquor and spirits.  Fats and oils: Butter. Canola oil. Vegetable oil. Beef fat (tallow). Lard. Sweets and desserts: Cookies. Cakes. Pies. Candy. Seasoning and other foods: Mayonnaise. Premade sauces and marinades. The items listed may not be a complete list. Talk with your dietitian about what dietary choices are right for you. Summary The Mediterranean diet includes both food and lifestyle choices. Eat a variety of fresh fruits and vegetables, beans, nuts, seeds, and whole grains. Limit the amount of red meat and sweets that  you eat. Talk with your health care provider about whether it is safe for you to drink red wine in moderation. This means 1 glass a day for nonpregnant women and 2 glasses a day for men. A glass of wine equals 5 oz (150 mL). This information is not intended to replace advice given to you by your health care provider. Make sure you discuss any questions you have with your health care provider. Document Released: 05/16/2016 Document Revised: 06/18/2016 Document Reviewed: 05/16/2016 Elsevier Interactive Patient Education  2017 [employer].

## 2022-05-01 NOTE — Progress Notes (Signed)
Assessment/Plan:   The patient is seen in neurologic consultation at the request of Early, Sung Amabile, NP for the evaluation of memory.  Julia Gutierrez is a very pleasant 66 y.o. year old RH female with  a history of hypertension, hyperlipidemia, anxiety, depression, history of migraines, benign paroxysmal vertigo, B12 deficiency, cervical spondylosis, history of Bell's palsy in February 2023, chronic mild leukocytosis, of Ramsay-Hunt syndrome, seen today for evaluation of memory loss. MoCA today is 27/30 with visuospatial executive 4/5, and delayed recall 5 out of 5.  She reports 2 episodes, one 18 years ago and one 1 year ago where TIA was suspected.  CT of the head in January 2022 was negative for acute on normalities, but did show advanced cerebral white matter disease likely secondary to chronic small vessel ischemia   Recommendations:   MIld Cognitive Impairment  MRI brain without contrast to assess for underlying structural abnormality and assess vascular load        Continue daily aspirin Check labs today Recommend referral to psychiatry, continue mood control as per PCP Patient has a history of Ramsay-Hunt syndrome, with vestibulopathy involving the CN VIII, continue clonazepam as it as needed Folllow up in 1 month   Subjective:     The patient is here alone    How long did patient have memory difficulties?  "My children think I have dementia".  She denies being forgetful.  During the conversation she changes the subject if she did not understand wheat they are saying.  Sometimes she reports difficulty finding the right word, especially when a lot of stress surrounds her.  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  She denies any dysarthria. Patient lives with:  Patient lives alone repeats oneself?  Disoriented when  walking into a room?  Patient denies   Leaving objects in unusual places?  Patient denies   Ambulates  with difficulty?  She has chronic dizziness, and has been evaluated in the past, this is felt to be due to benign positional vertigo, has taken several medications including meclizine, "that helped somewhat, but did not resolve it ".  She has tried vestibular therapy in the past, and uses some of the techniques taught to her.   Recent falls?  Due to these chronic issues, regarding benign positional vertigo, she has a tendency to fall. Any head injuries?  Patient denies   History of seizures?   Patient denies   Wandering behavior?  Patient denies   Patient drives?  No issues.  Any mood changes?  Endorsed.  She has a history of anxiety and depression, and was given several medications for this, which some of them cause dizziness.   Hallucinations?  Patient denies   Paranoia?  Patient denies   Patient reports that he sleeps well without vivid dreams, REM behavior or sleepwalking     History of sleep apnea?  Patient denies   Any hygiene concerns?  Patient denies   Independent of bathing and dressing?  Endorsed  Does the patient needs help with medications?  Who is in charge of the finances?  Patient is in charge  Any changes in appetite?  Patient denies   Patient have trouble swallowing? Patient denies   Does the patient cook?  Patient denies   Any kitchen accidents such as leaving the  stove on? Patient denies   Any headaches?  Endorsed, she has chronic headaches, well controlled with gabapentin and Imitrex. Any vision changes?  She has a history of chronic dry eyes. Any focal numbness or tingling?  About 18 years ago, the patient, while [place] the shopping center, experienced right-sided numbness and weakness, and "could not speak ", these lasted a few seconds, without recurrence until 1.5 yrs ago with similar symptoms, at which time a CT of the head was performed, showing chronic microvascular  disease.  No stroke was noted in the CT scan, but she is now on aspirin daily.  At the time, she was diagnosed with Bell's palsy and I discharged with prednisone and valacyclovir.  She denies any stroke symptoms recurrence since then.   Chronic back pain Patient denies   Unilateral weakness?  Patient denies   Any tremors?  Patient denies   Any history of anosmia?  Patient denies   Any incontinence of urine?  Patient denies   Any bowel dysfunction?   Patient denies   History of heavy alcohol intake?  Patient denies   History of heavy tobacco use?  Patient denies   Family history of dementia?  Patient denies      Recent labs May 2023 TSH 3.19, D 25 30.1, A1c 5.7, LDL 118 otherwise normal, U31 is pending.  She has worked in Insurance account manager  Past Medical History:  Diagnosis Date   Acute cystitis without hematuria 08/28/2021   Cervical spondylosis 05/04/2021   Cervicogenic headache 04/13/2021   Chronic fatigue 04/13/2021   Encounter to establish care 04/13/2021   Enlarged glands 06/18/2021   Hypertension    Hypothyroidism    IBS (irritable bowel syndrome)    Increased thirst 04/13/2021   Mouth dryness 04/13/2021   Palpitations 04/13/2021   Vertigo 04/13/2021     Past Surgical History:  Procedure Laterality Date   CHOLECYSTECTOMY       Allergies  Allergen Reactions   Promethazine Other (See Comments)    Muscle spasms Other reaction(s): Other (See Comments) Muscle spasms Muscle spasms    Current Outpatient Medications  Medication Instructions   acetaminophen (TYLENOL) 650 mg, Oral, Every 6 hours PRN   amLODipine (NORVASC) 5 mg, Oral, Daily   aspirin 325 mg, Oral, Daily   chlorhexidine (PERIDEX) 0.12 % solution 15 mLs, Mouth/Throat, 2 times daily   clonazePAM (KLONOPIN) 1 mg, Oral, 2 times daily PRN   cyclobenzaprine (FLEXERIL) 10 mg, Oral, 3 times daily PRN   gabapentin (NEURONTIN) 100 MG capsule Take 1 capsule (100 mg total) by mouth 2 (two) times daily AND 3 capsules (300 mg  total) at bedtime. Use as needed for neuropathic pain.   hydrochlorothiazide (HYDRODIURIL) 25 mg, Oral, Daily   Multiple Vitamins-Minerals (MULTIVITAMIN WITH MINERALS) tablet 1 tablet, Oral, Daily   pilocarpine (SALAGEN) 5 mg, Oral, 2 times daily   sertraline (ZOLOFT) 50 MG tablet Take 0.5 tablets (25 mg total) by mouth at bedtime for 4 days, THEN 1 tablet (50 mg total) at bedtime.   Vitamin D, Ergocalciferol, (DRISDOL) 1.25 MG (50000 UNIT) CAPS capsule Take 1 capsule (50,000 Units total) by mouth every 7 (seven) days. Take for 12 total doses (weeks) then can transition to 1000 units OTC supplement daily     VITALS:   Vitals:   05/01/22 0945  BP: 135/82  Pulse: 72  SpO2: 98%  Weight: 153 lb (69.4 kg)  Height: 5\' 2"  (1.575 m)      04/17/2022   10:32 AM 02/20/2022  9:03 AM 04/13/2021    9:35 AM  Depression screen PHQ 2/9  Decreased Interest 0 0 1  Down, Depressed, Hopeless 0 0 1  PHQ - 2 Score 0 0 2  Altered sleeping 0  3  Tired, decreased energy 0  3  Change in appetite 0  3  Feeling bad or failure about yourself  0  3  Trouble concentrating 0  3  Moving slowly or fidgety/restless 0  3  Suicidal thoughts 0  0  PHQ-9 Score 0  20  Difficult doing work/chores Not difficult at all  Somewhat difficult    PHYSICAL EXAM   HEENT:  Normocephalic, atraumatic. The mucous membranes are moist. The superficial temporal arteries are without ropiness or tenderness. Cardiovascular: Regular rate and rhythm. Lungs: Clear to auscultation bilaterally. Neck: There are no carotid bruits noted bilaterally.  NEUROLOGICAL:    05/01/2022   10:00 AM  Montreal Cognitive Assessment   Visuospatial/ Executive (0/5) 4  Naming (0/3) 3  Attention: Read list of digits (0/2) 2  Attention: Read list of letters (0/1) 1  Attention: Serial 7 subtraction starting at 100 (0/3) 3  Language: Repeat phrase (0/2) 1  Language : Fluency (0/1) 0  Abstraction (0/2) 2  Delayed Recall (0/5) 5  Orientation (0/6) 6   Total 27  Adjusted Score (based on education) 27        No data to display           Orientation:  Alert and oriented to person, place and time. No aphasia or dysarthria. Fund of knowledge is appropriate. Recent and remote memory intact.  Attention and concentration are normal.  Able to name objects and repeat phrases. Delayed recall  5/5 Cranial nerves: There is good facial symmetry. Extraocular muscles are intact and visual fields are full to confrontational testing. Speech is fluent and clear. Soft palate rises symmetrically and there is no tongue deviation. Hearing is intact to conversational tone. Tone: Tone is good throughout. Sensation: Sensation is intact to light touch and pinprick throughout. Vibration is intact at the bilateral big toe.There is no extinction with double simultaneous stimulation. There is no sensory dermatomal level identified. Coordination: The patient has no difficulty with RAM's or FNF bilaterally. Normal finger to nose  Motor: Strength is 5/5 in the bilateral upper and lower extremities. There is no pronator drift. There are no fasciculations noted. DTR's: Deep tendon reflexes are 2/4 at the bilateral biceps, triceps, brachioradialis, patella and achilles.  Plantar responses are downgoing bilaterally. Gait and Station: The patient is able to ambulate without difficulty.The patient is able to heel toe walk without any difficulty.The patient is able to ambulate in a tandem fashion. The patient is able to stand in the Romberg position.    Thank you for allowing [place] the opportunity to participate in the care of this nice patient. Please do not hesitate to contact us for any questions or concerns.   Total time spent on today's visit was 50 minutes dedicated to this patient today, preparing to see patient, examining the patient, ordering tests and/or medications and counseling the patient, documenting clinical information in the EHR or other health record,  independently interpreting results and communicating results to the patient/family, discussing treatment and goals, answering patient's questions and coordinating care.  Cc:  Tollie Eth, NP  Marlowe Kays 05/01/2022 10:28 AM

## 2022-05-02 ENCOUNTER — Telehealth: Payer: Self-pay

## 2022-05-02 ENCOUNTER — Telehealth: Payer: Self-pay | Admitting: Physician Assistant

## 2022-05-02 NOTE — Telephone Encounter (Signed)
Pt called an informed that Huntley Dec PA Recommend B12 supplements, today B12 is 232, follow up with primary doctor

## 2022-05-02 NOTE — Telephone Encounter (Signed)
Pt called in and left a message. She is returning a call to [facility]

## 2022-05-02 NOTE — Telephone Encounter (Signed)
-----   Message from Marcos Eke, PA-C sent at 05/01/2022  3:08 PM EDT ----- Recommend B12 supplements, today B12 is 232, follow up with primary doctor

## 2022-05-02 NOTE — Telephone Encounter (Signed)
Patient returned another call to [facility].

## 2022-05-06 ENCOUNTER — Encounter (HOSPITAL_BASED_OUTPATIENT_CLINIC_OR_DEPARTMENT_OTHER): Payer: Self-pay | Admitting: Nurse Practitioner

## 2022-05-13 ENCOUNTER — Ambulatory Visit
Admission: RE | Admit: 2022-05-13 | Discharge: 2022-05-13 | Disposition: A | Discharge status: Home / Self Care | Payer: Medicare Other | Source: Ambulatory Visit | Attending: Physician Assistant | Admitting: Physician Assistant

## 2022-05-13 ENCOUNTER — Other Ambulatory Visit (HOSPITAL_COMMUNITY): Payer: Self-pay

## 2022-05-13 DIAGNOSIS — Z8673 Personal history of transient ischemic attack (TIA), and cerebral infarction without residual deficits: Secondary | ICD-10-CM

## 2022-05-13 DIAGNOSIS — R413 Other amnesia: Secondary | ICD-10-CM

## 2022-05-15 ENCOUNTER — Other Ambulatory Visit: Payer: Self-pay

## 2022-05-15 ENCOUNTER — Other Ambulatory Visit (HOSPITAL_COMMUNITY): Payer: Self-pay

## 2022-05-15 DIAGNOSIS — R413 Other amnesia: Secondary | ICD-10-CM

## 2022-05-15 DIAGNOSIS — I6389 Other cerebral infarction: Secondary | ICD-10-CM

## 2022-05-16 ENCOUNTER — Other Ambulatory Visit (INDEPENDENT_AMBULATORY_CARE_PROVIDER_SITE_OTHER): Payer: Medicare Other

## 2022-05-16 ENCOUNTER — Other Ambulatory Visit: Payer: Self-pay

## 2022-05-16 DIAGNOSIS — I6389 Other cerebral infarction: Secondary | ICD-10-CM

## 2022-05-16 DIAGNOSIS — G459 Transient cerebral ischemic attack, unspecified: Secondary | ICD-10-CM

## 2022-05-16 DIAGNOSIS — R739 Hyperglycemia, unspecified: Secondary | ICD-10-CM

## 2022-05-16 LAB — LIPID PANEL
Cholesterol: 184 mg/dL (ref 0–200)
HDL: 49 mg/dL (ref >39.00)
LDL Cholesterol: 111 mg/dL — ABNORMAL HIGH (ref 0–99)
NonHDL: 134.63
Total CHOL/HDL Ratio: 4
Triglycerides: 118 mg/dL (ref 0.0–149.0)
VLDL: 23.6 mg/dL (ref 0.0–40.0)

## 2022-05-16 LAB — HEMOGLOBIN A1C: Hgb A1c MFr Bld: 5.8 % (ref 4.6–6.5)

## 2022-05-21 ENCOUNTER — Other Ambulatory Visit: Payer: Self-pay | Admitting: Physician Assistant

## 2022-05-21 DIAGNOSIS — I6389 Other cerebral infarction: Secondary | ICD-10-CM

## 2022-05-22 ENCOUNTER — Other Ambulatory Visit: Payer: Self-pay

## 2022-05-23 ENCOUNTER — Ambulatory Visit (INDEPENDENT_AMBULATORY_CARE_PROVIDER_SITE_OTHER): Payer: Medicare Other | Admitting: Nurse Practitioner

## 2022-05-23 ENCOUNTER — Other Ambulatory Visit (HOSPITAL_COMMUNITY): Payer: Self-pay

## 2022-05-23 DIAGNOSIS — F4323 Adjustment disorder with mixed anxiety and depressed mood: Secondary | ICD-10-CM | POA: Insufficient documentation

## 2022-05-23 DIAGNOSIS — H8113 Benign paroxysmal vertigo, bilateral: Secondary | ICD-10-CM | POA: Diagnosis not present

## 2022-05-23 HISTORY — DX: Adjustment disorder with mixed anxiety and depressed mood: F43.23

## 2022-05-23 MED ORDER — SERTRALINE HCL 50 MG PO TABS
75.0000 mg | ORAL_TABLET | Freq: Every day | ORAL | 3 refills | Status: DC
  Filled 2022-05-23 – 2022-05-29 (×3): qty 135, 90d supply, fill #0
  Filled 2022-09-04: qty 135, 90d supply, fill #1
  Filled 2022-12-01 – 2022-12-10 (×2): qty 135, 90d supply, fill #2

## 2022-05-23 NOTE — Assessment & Plan Note (Addendum)
Symptoms have improved with the addition of sertraline for her mood. She has also quit her job, which has improved her stress levels. She is still experiencing a significant amount of depression with her chronic vertigo, which is very distressing for her. I would like for her to be able to return to her previous physical activity level for both her physical and mental health. We will plan to increase sertraline to 75mg  daily and monitor for symptom improvement. Will plan to follow-up in 3 months to monitor progression.

## 2022-05-23 NOTE — Progress Notes (Signed)
Virtual Visit Encounter telephone visit.   I connected with  Julia Gutierrez on 07/03/22 at  8:50 AM EDT by secure audio telemedicine application. I verified that I am speaking with the correct person using two identifiers.   I introduced myself as a Publishing rights manager with the practice. The limitations of evaluation and management by telemedicine discussed with the patient and the availability of in person appointments. The patient expressed verbal understanding and consent to proceed.  Participating parties in this visit include: Myself and patient  The patient is: Patient Location: Home I am: Provider Location: Office/Clinic Subjective:    CC and HPI: Julia Gutierrez is a 66 y.o. year old female presenting for follow up of mood. Patient reports the following: Mood - She tells me that the medication is really helping her mood and she is feeling much less irritated.  - she has some mild nausea initially, but that passed quickly and she is no longer having issues - she tells me that her constant state of dizziness definitely affects her mood so it is hard for her to tell if the medication is at the best dose for her. She still feels down over the situation.  - she tells me if she could figure out how to start exercising again she would feel better, but the vertigo does not allow her to do her previous gym exercises. She has been trying chair yoga, but this is not the same satisfaction that going to the gym. She reports that it is most difficult to get over this limitation.  - she tells me the neurologist ordered a scan on blood flow to the brain.  - she tells me she has quit her job due to the stress level that this was causing and she is feeling better about this and her stress is much improved.   Past medical history, Surgical history, Family history not pertinant except as noted below, Social history, Allergies, and medications have been entered into the medical record, reviewed, and  corrections made.   Review of Systems:  All review of systems negative except what is listed in the HPI  Objective:    Alert and oriented x 4 Speaking in clear sentences with no shortness of breath. No distress.  Impression and Recommendations:    Problem List Items Addressed This Visit     Benign paroxysmal vertigo of both ears    BPV related to Ramsay Hunt syndrome with chronic vertigo. Unfortunately, our efforts to help relieve her symptoms have been unsuccessful. This is definitely a component of her depressive symptoms. For now, we will continue treatment with meclizine and PRN clonazepam for severe conditions. Will continue to provide supportive care and monitor closely.       Adjustment reaction with anxiety and depression - Primary    Symptoms have improved with the addition of sertraline for her mood. She has also quit her job, which has improved her stress levels. She is still experiencing a significant amount of depression with her chronic vertigo, which is very distressing for her. I would like for her to be able to return to her previous physical activity level for both her physical and mental health. We will plan to increase sertraline to 75mg  daily and monitor for symptom improvement. Will plan to follow-up in 3 months to monitor progression.       Relevant Medications   sertraline (ZOLOFT) 50 MG tablet    orders and follow up as documented in EMR I discussed the assessment and  treatment plan with the patient. The patient was provided an opportunity to ask questions and all were answered. The patient agreed with the plan and demonstrated an understanding of the instructions.   The patient was advised to call back or seek an in-person evaluation if the symptoms worsen or if the condition fails to improve as anticipated.  Follow-Up: in 3 months  I provided 17 minutes of non-face-to-face interaction with this non face-to-face encounter including intake, same-day  documentation, and chart review.   Tollie Eth, NP , DNP, AGNP-c [facility] at [facility] [phone] (fax)

## 2022-05-23 NOTE — Patient Instructions (Signed)
Increase the sertraline to 75mg  daily. I have sent a new order into the pharmacy.   I will check with the physical therapist to see if we can come up with any suggestions on exercises that you may be able to do that wont make your vertigo worse.

## 2022-05-24 ENCOUNTER — Other Ambulatory Visit (HOSPITAL_COMMUNITY): Payer: Self-pay

## 2022-05-26 DIAGNOSIS — R413 Other amnesia: Secondary | ICD-10-CM | POA: Insufficient documentation

## 2022-05-26 NOTE — Assessment & Plan Note (Signed)
Positive depressive symptoms related to chronic changes associated with ongoing effects of Ramsay Hunt syndrome. Her condition has had a significant impact on her physical abilities and daily life, therefore it is understandable that this would have a negative impact on her mood. Unfortunately, treatments have been unsuccessful in management of her symptoms of the illness, which also has led to prolonged depression. Discussion with patient today about medication options that may be helpful for mood. It is unclear if the depression could be contributing to all of the concerns that she is having, but I do feel that they could be responsible for some. We will start with medication to see if we can get improvement on her mood and mental status and monitor for changes in areas of cognition and personality.

## 2022-05-26 NOTE — Assessment & Plan Note (Addendum)
Changes noted in memory, focus, and cognition by family that are considerably concerning. The patient has noticed some of the changes herself. After discussion with both the patient and her family today, I do feel that further evaluation is warranted. It is possible that some of the changes are related to mood, which is understandable, but there is concern that there are additional factors contributing to the noted changes. I will send referral to neuro for further evaluation and recommendation. She may need neurocognitive testing to help clearly identify any areas of deficit. We are going to start treatment for mood today to see if this is helpful. I do not see any alarm symptoms present today to indicate CVA, however, TIA's are unable to be ruled out at this time. Further imaging may be helpful.

## 2022-05-26 NOTE — Assessment & Plan Note (Signed)
Chronic ongoing persistent vertigo with significant impact on daily life and functioning. Unfortunately we have not found a treatment modality to help reduce her symptoms. This has most certainly negatively affected her life. She is aware that her symptoms are lifelong as there are no permanent treatment options available at this time. We will continue supportive care at this time.

## 2022-05-29 ENCOUNTER — Other Ambulatory Visit (HOSPITAL_COMMUNITY): Payer: Self-pay

## 2022-05-30 ENCOUNTER — Other Ambulatory Visit (HOSPITAL_COMMUNITY): Payer: Self-pay

## 2022-06-04 ENCOUNTER — Other Ambulatory Visit (HOSPITAL_COMMUNITY): Payer: Self-pay

## 2022-06-04 ENCOUNTER — Encounter: Payer: Self-pay | Admitting: Physician Assistant

## 2022-06-04 ENCOUNTER — Ambulatory Visit (INDEPENDENT_AMBULATORY_CARE_PROVIDER_SITE_OTHER): Payer: Medicare Other | Admitting: Physician Assistant

## 2022-06-04 VITALS — BP 132/80 | HR 79 | Resp 17 | Ht 62.0 in | Wt 153.0 lb

## 2022-06-04 DIAGNOSIS — I639 Cerebral infarction, unspecified: Secondary | ICD-10-CM

## 2022-06-04 DIAGNOSIS — H8113 Benign paroxysmal vertigo, bilateral: Secondary | ICD-10-CM

## 2022-06-04 DIAGNOSIS — I6389 Other cerebral infarction: Secondary | ICD-10-CM | POA: Diagnosis not present

## 2022-06-04 DIAGNOSIS — R413 Other amnesia: Secondary | ICD-10-CM

## 2022-06-04 NOTE — Progress Notes (Unsigned)
Assessment/Plan:   Mild Cognitive Impairment   Julia Gutierrez is a very pleasant 66 y.o. RH female presenting today in follow-up for evaluation of memory loss. Patient is on *** MMSE today is   /30  with delayed recall  /3     Recommendations:   Follow up in   months.   Case discussed with Dr. Karel Jarvis who agrees with the plan     Subjective:   This patient is accompanied in the office by   who supplements the history. Previous records as well as any outside records available were reviewed prior to todays visit.  ***  Patient is currently on    Any changes in memory since last visit? repeats oneself?  Endorsed Patient lives with:  Disoriented when walking into a room?  Patient denies   Leaving objects in unusual places?  Patient denies   Ambulates  with difficulty?   Patient denies   Recent falls?  Patient denies   Any head injuries?  Patient denies   History of seizures?   Patient denies   Wandering behavior?  Patient denies   Patient drives?   Patient no longer drives  Any mood changes since last visit?  Patient denies   Any worsening depression?:  Patient denies   Hallucinations?  Patient denies   Paranoia?  Patient denies   Patient reports that sleeps well without vivid dreams, REM behavior or sleepwalking   History of sleep apnea?  Patient denies   Any hygiene concerns?  Patient denies   Independent of bathing and dressing?  Endorsed  Does the patient needs help with medications?  Denies Who is in charge of the finances?   is in charge    Any changes in appetite?  Patient denies   Patient have trouble swallowing? Patient denies   Does the patient cook?  Patient denies   Any kitchen accidents such as leaving the stove on? Patient denies   Any headaches?  Patient denies   Double vision? Patient denies   Any focal numbness or tingling?  Patient denies   Chronic back pain Patient denies   Unilateral weakness?  Patient denies   Any tremors?  Patient denies    Any history of anosmia?  Patient denies   Any incontinence of urine?  Patient denies   Any bowel dysfunction?   Patient denies       Past Medical History:  Diagnosis Date   Acute cystitis without hematuria 08/28/2021   Cervical spondylosis 05/04/2021   Cervicogenic headache 04/13/2021   Chronic fatigue 04/13/2021   Encounter to establish care 04/13/2021   Enlarged glands 06/18/2021   Hypertension    Hypothyroidism    IBS (irritable bowel syndrome)    Increased thirst 04/13/2021   Mouth dryness 04/13/2021   Palpitations 04/13/2021   Vertigo 04/13/2021     Past Surgical History:  Procedure Laterality Date   CHOLECYSTECTOMY       PREVIOUS MEDICATIONS:   CURRENT MEDICATIONS:  Outpatient Encounter Medications as of 06/04/2022  Medication Sig   acetaminophen (TYLENOL) 325 MG tablet Take 650 mg by mouth every 6 (six) hours as needed.   amLODipine (NORVASC) 5 MG tablet Take 1 tablet (5 mg total) by mouth daily.   aspirin 325 MG tablet Take 325 mg by mouth daily.   chlorhexidine (PERIDEX) 0.12 % solution Use as directed 15 mLs in the mouth or throat 2 (two) times daily.   clonazePAM (KLONOPIN) 1 MG tablet Take 1 tablet (1 mg total) by  mouth 2 (two) times daily as needed (chronic vertigo).   gabapentin (NEURONTIN) 100 MG capsule Take 1 capsule (100 mg total) by mouth 2 (two) times daily AND 3 capsules (300 mg total) at bedtime. Use as needed for neuropathic pain.   hydrochlorothiazide (HYDRODIURIL) 25 MG tablet Take 1 tablet (25 mg total) by mouth daily.   Multiple Vitamins-Minerals (MULTIVITAMIN WITH MINERALS) tablet Take 1 tablet by mouth daily.   sertraline (ZOLOFT) 50 MG tablet Take 1.5 tablets (75 mg total) by mouth at bedtime.   Vitamin D, Ergocalciferol, (DRISDOL) 1.25 MG (50000 UNIT) CAPS capsule Take 1 capsule (50,000 Units total) by mouth every 7 (seven) days. Take for 12 total doses (weeks) then can transition to 1000 units OTC supplement daily   No facility-administered  encounter medications on file as of 06/04/2022.     Objective:     PHYSICAL EXAMINATION:    VITALS:   Vitals:   06/04/22 0933  BP: 132/80  Pulse: 79  Resp: 17  SpO2: 95%  Weight: 153 lb (69.4 kg)  Height: 5\' 2"  (1.575 m)    GEN:  The patient appears stated age and is in NAD. HEENT:  Normocephalic, atraumatic.   Neurological examination:  General: NAD, well-groomed, appears stated age. Orientation: The patient is alert. Oriented to person, place and date Cranial nerves: There is good facial symmetry.The speech is fluent and clear. No aphasia or dysarthria. Fund of knowledge is appropriate. Recent memory impaired and remote memory is normal.  Attention and concentration are normal.  Able to name objects and repeat phrases.  Hearing is intact to conversational tone.    Sensation: Sensation is intact to light touch throughout Motor: Strength is at least antigravity x4. Tremors: none  DTR's 2/4 in UE/LE      05/01/2022   10:00 AM  Montreal Cognitive Assessment   Visuospatial/ Executive (0/5) 4  Naming (0/3) 3  Attention: Read list of digits (0/2) 2  Attention: Read list of letters (0/1) 1  Attention: Serial 7 subtraction starting at 100 (0/3) 3  Language: Repeat phrase (0/2) 1  Language : Fluency (0/1) 0  Abstraction (0/2) 2  Delayed Recall (0/5) 5  Orientation (0/6) 6  Total 27  Adjusted Score (based on education) 27        No data to display             Movement examination: Tone: There is normal tone in the UE/LE Abnormal movements:  no tremor.  No myoclonus.  No asterixis.   Coordination:  There is no decremation with RAM's. Normal finger to nose  Gait and Station: The patient has no difficulty arising out of a deep-seated chair without the use of the hands. The patient's stride length is good.  Gait is cautious and narrow.   Thank you for allowing 05/03/2022 the opportunity to participate in the care of this nice patient. Please do not hesitate to contact us for  any questions or concerns.   Total time spent on today's visit was *** minutes dedicated to this patient today, preparing to see patient, examining the patient, ordering tests and/or medications and counseling the patient, documenting clinical information in the EHR or other health record, independently interpreting results and communicating results to the patient/family, discussing treatment and goals, answering patient's questions and coordinating care.  Cc:  [place], NP  Tollie Eth 06/04/2022 9:49 AM

## 2022-06-04 NOTE — Patient Instructions (Signed)
It was a pleasure to see you today at our office.   Recommendations:  2 D echo Continue ASA daily Continue to monitor mood  Recommend vestibular therapy for vertigo  CT angio of the head and neck  Follow up in 3 months   Whom to call:  Memory  decline, memory medications: Call our office [phone]   For psychiatric meds, mood meds: Please have your primary care physician manage these medications.   Counseling regarding caregiver distress, including caregiver depression, anxiety and issues regarding community resources, adult day care programs, adult living facilities, or memory care questions:   Feel free to contact Misty Lisabeth Register, Social Worker at [phone]   For assessment of decision of mental capacity and competency:  Call Dr. Erick Blinks, geriatric psychiatrist at [phone]  For guidance in geriatric dementia issues please call [facility] [phone]  For guidance regarding [facility] and if placement were needed at the facility, contact Sidney Ace, Social Worker tel: [phone]  If you have any severe symptoms of a stroke, or other severe issues such as confusion,severe chills or fever, etc call 911 or go to the ER as you may need to be evaluated further        RECOMMENDATIONS FOR ALL PATIENTS WITH MEMORY PROBLEMS: 1. Continue to exercise (Recommend 30 minutes of walking everyday, or 3 hours every week) 2. Increase social interactions - continue going to [place] and enjoy social gatherings with friends and family 3. Eat healthy, avoid fried foods and eat more fruits and vegetables 4. Maintain adequate blood pressure, blood sugar, and blood cholesterol level. Reducing the risk of stroke and cardiovascular disease also helps promoting better memory. 5. Avoid stressful situations. Live a simple life and avoid aggravations. Organize your time and prepare for the next day in anticipation. 6. Sleep well, avoid any  interruptions of sleep and avoid any distractions in the bedroom that may interfere with adequate sleep quality 7. Avoid sugar, avoid sweets as there is a strong link between excessive sugar intake, diabetes, and cognitive impairment We discussed the Mediterranean diet, which has been shown to help patients reduce the risk of progressive memory disorders and reduces cardiovascular risk. This includes eating fish, eat fruits and green leafy vegetables, nuts like almonds and hazelnuts, walnuts, and also use olive oil. Avoid fast foods and fried foods as much as possible. Avoid sweets and sugar as sugar use has been linked to worsening of memory function.  There is always a concern of gradual progression of memory problems. If this is the case, then we may need to adjust level of care according to patient needs. Support, both to the patient and caregiver, should then be put into place.      You have been referred for a neuropsychological evaluation (i.e., evaluation of memory and thinking abilities). Please bring someone with you to this appointment if possible, as it is helpful for the doctor to hear from both you and another adult who knows you well. Please bring eyeglasses and hearing aids if you wear them.    The evaluation will take approximately 3 hours and has two parts:   The first part is a clinical interview with the neuropsychologist (Dr. Milbert Coulter or Dr. Roseanne Reno). During the interview, the neuropsychologist will speak with you and the individual you brought to the appointment.    The second part of the evaluation is testing with the doctor's technician Annabelle Harman or Selena Batten). During the testing, the technician will ask you to  remember different types of material, solve problems, and answer some questionnaires. Your family member will not be present for this portion of the evaluation.   Please note: We must reserve several hours of the neuropsychologist's time and the psychometrician's time for your  evaluation appointment. As such, there is a No-Show fee of $100. If you are unable to attend any of your appointments, please contact our office as soon as possible to reschedule.    FALL PRECAUTIONS: Be cautious when walking. Scan the area for obstacles that may increase the risk of trips and falls. When getting up in the mornings, sit up at the edge of the bed for a few minutes before getting out of bed. Consider elevating the bed at the head end to avoid drop of blood pressure when getting up. Walk always in a well-lit room (use night lights in the walls). Avoid area rugs or power cords from appliances in the middle of the walkways. Use a walker or a cane if necessary and consider physical therapy for balance exercise. Get your eyesight checked regularly.  FINANCIAL OVERSIGHT: Supervision, especially oversight when making financial decisions or transactions is also recommended.  HOME SAFETY: Consider the safety of the kitchen when operating appliances like stoves, microwave oven, and blender. Consider having supervision and share cooking responsibilities until no longer able to participate in those. Accidents with firearms and other hazards in the house should be identified and addressed as well.   ABILITY TO BE LEFT ALONE: If patient is unable to contact 911 operator, consider using [facility], or when the need is there, arrange for someone to stay with patients. Smoking is a fire hazard, consider supervision or cessation. Risk of wandering should be assessed by caregiver and if detected at any point, supervision and safe proof recommendations should be instituted.  MEDICATION SUPERVISION: Inability to self-administer medication needs to be constantly addressed. Implement a mechanism to ensure safe administration of the medications.   DRIVING: Regarding driving, in patients with progressive memory problems, driving will be impaired. We advise to have someone else do the driving if trouble finding  directions or if minor accidents are reported. Independent driving assessment is available to determine safety of driving.   If you are interested in the driving assessment, you can contact the following:  [employer] in [place] [phone]  [facility] [phone]  [facility] [phone] or [phone]    Mediterranean Diet A Mediterranean diet refers to food and lifestyle choices that are based on the traditions of countries located on the [employer]. This way of eating has been shown to help prevent certain conditions and improve outcomes for people who have chronic diseases, like kidney disease and heart disease. What are tips for following this plan? Lifestyle  Cook and eat meals together with your family, when possible. Drink enough fluid to keep your urine clear or pale yellow. Be physically active every day. This includes: Aerobic exercise like running or swimming. Leisure activities like gardening, walking, or housework. Get 7-8 hours of sleep each night. If recommended by your health care provider, drink red wine in moderation. This means 1 glass a day for nonpregnant women and 2 glasses a day for men. A glass of wine equals 5 oz (150 mL). Reading food labels  Check the serving size of packaged foods. For foods such as rice and pasta, the serving size refers to the amount of cooked product, not dry. Check the total fat in packaged  foods. Avoid foods that have saturated fat or trans fats. Check the ingredients list for added sugars, such as corn syrup. Shopping  At the grocery store, buy most of your food from the areas near the walls of the store. This includes: Fresh fruits and vegetables (produce). Grains, beans, nuts, and seeds. Some of these may be available in unpackaged forms or large amounts (in bulk). Fresh seafood. Poultry and eggs. Low-fat dairy products. Buy whole  ingredients instead of prepackaged foods. Buy fresh fruits and vegetables in-season from local farmers markets. Buy frozen fruits and vegetables in resealable bags. If you do not have access to quality fresh seafood, buy precooked frozen shrimp or canned fish, such as tuna, salmon, or sardines. Buy small amounts of raw or cooked vegetables, salads, or olives from the deli or salad bar at your store. Stock your pantry so you always have certain foods on hand, such as olive oil, canned tuna, canned tomatoes, rice, pasta, and beans. Cooking  Cook foods with extra-virgin olive oil instead of using butter or other vegetable oils. Have meat as a side dish, and have vegetables or grains as your main dish. This means having meat in small portions or adding small amounts of meat to foods like pasta or stew. Use beans or vegetables instead of meat in common dishes like chili or lasagna. Experiment with different cooking methods. Try roasting or broiling vegetables instead of steaming or sauteing them. Add frozen vegetables to soups, stews, pasta, or rice. Add nuts or seeds for added healthy fat at each meal. You can add these to yogurt, salads, or vegetable dishes. Marinate fish or vegetables using olive oil, lemon juice, garlic, and fresh herbs. Meal planning  Plan to eat 1 vegetarian meal one day each week. Try to work up to 2 vegetarian meals, if possible. Eat seafood 2 or more times a week. Have healthy snacks readily available, such as: Vegetable sticks with hummus. Greek yogurt. Fruit and nut trail mix. Eat balanced meals throughout the week. This includes: Fruit: 2-3 servings a day Vegetables: 4-5 servings a day Low-fat dairy: 2 servings a day Fish, poultry, or lean meat: 1 serving a day Beans and legumes: 2 or more servings a week Nuts and seeds: 1-2 servings a day Whole grains: 6-8 servings a day Extra-virgin olive oil: 3-4 servings a day Limit red meat and sweets to only a few servings  a month What are my food choices? Mediterranean diet Recommended Grains: Whole-grain pasta. Brown rice. Bulgar wheat. Polenta. Couscous. Whole-wheat bread. Orpah Cobb. Vegetables: Artichokes. Beets. Broccoli. Cabbage. Carrots. Eggplant. Green beans. Chard. Kale. Spinach. Onions. Leeks. Peas. Squash. Tomatoes. Peppers. Radishes. Fruits: Apples. Apricots. Avocado. Berries. Bananas. Cherries. Dates. Figs. Grapes. Lemons. Melon. Oranges. Peaches. Plums. Pomegranate. Meats and other protein foods: Beans. Almonds. Sunflower seeds. Pine nuts. Peanuts. Cod. Salmon. Scallops. Shrimp. Tuna. Tilapia. Clams. Oysters. Eggs. Dairy: Low-fat milk. Cheese. Greek yogurt. Beverages: Water. Red wine. Herbal tea. Fats and oils: Extra virgin olive oil. Avocado oil. Grape seed oil. Sweets and desserts: [place] yogurt with honey. Baked apples. Poached pears. Trail mix. Seasoning and other foods: Basil. Cilantro. Coriander. Cumin. Mint. Parsley. Sage. Rosemary. Tarragon. Garlic. Oregano. Thyme. Pepper. Balsalmic vinegar. Tahini. Hummus. Tomato sauce. Olives. Mushrooms. Limit these Grains: Prepackaged pasta or rice dishes. Prepackaged cereal with added sugar. Vegetables: Deep fried potatoes (french fries). Fruits: Fruit canned in syrup. Meats and other protein foods: Beef. Pork. Lamb. Poultry with skin. Hot dogs. Tomasa Blase. Dairy: Ice cream. Sour cream. Whole milk. Beverages: Juice.  Sugar-sweetened soft drinks. Beer. Liquor and spirits. Fats and oils: Butter. Canola oil. Vegetable oil. Beef fat (tallow). Lard. Sweets and desserts: Cookies. Cakes. Pies. Candy. Seasoning and other foods: Mayonnaise. Premade sauces and marinades. The items listed may not be a complete list. Talk with your dietitian about what dietary choices are right for you. Summary The Mediterranean diet includes both food and lifestyle choices. Eat a variety of fresh fruits and vegetables, beans, nuts, seeds, and whole grains. Limit the amount of  red meat and sweets that you eat. Talk with your health care provider about whether it is safe for you to drink red wine in moderation. This means 1 glass a day for nonpregnant women and 2 glasses a day for men. A glass of wine equals 5 oz (150 mL). This information is not intended to replace advice given to you by your health care provider. Make sure you discuss any questions you have with your health care provider. Document Released: 05/16/2016 Document Revised: 06/18/2016 Document Reviewed: 05/16/2016 Elsevier Interactive Patient Education  2017 [employer].

## 2022-06-05 ENCOUNTER — Other Ambulatory Visit (HOSPITAL_COMMUNITY): Payer: Self-pay

## 2022-06-05 ENCOUNTER — Other Ambulatory Visit (HOSPITAL_BASED_OUTPATIENT_CLINIC_OR_DEPARTMENT_OTHER): Payer: Self-pay | Admitting: Nurse Practitioner

## 2022-06-05 DIAGNOSIS — B0221 Postherpetic geniculate ganglionitis: Secondary | ICD-10-CM

## 2022-06-05 MED ORDER — CLONAZEPAM 1 MG PO TABS
1.0000 mg | ORAL_TABLET | Freq: Two times a day (BID) | ORAL | 2 refills | Status: DC | PRN
  Filled 2022-06-05 – 2022-08-09 (×2): qty 60, 30d supply, fill #0

## 2022-06-06 ENCOUNTER — Other Ambulatory Visit (HOSPITAL_COMMUNITY): Payer: Self-pay

## 2022-06-11 MED ORDER — CLONAZEPAM 1 MG PO TABS
1.0000 mg | ORAL_TABLET | Freq: Two times a day (BID) | ORAL | 2 refills | Status: DC | PRN
  Filled 2022-06-11 – 2022-08-08 (×3): qty 60, 30d supply, fill #0

## 2022-06-12 ENCOUNTER — Ambulatory Visit
Admission: RE | Admit: 2022-06-12 | Discharge: 2022-06-12 | Disposition: A | Discharge status: Home / Self Care | Payer: Medicare Other | Source: Ambulatory Visit | Attending: Physician Assistant | Admitting: Physician Assistant

## 2022-06-12 ENCOUNTER — Other Ambulatory Visit (HOSPITAL_COMMUNITY): Payer: Self-pay

## 2022-06-12 DIAGNOSIS — I6389 Other cerebral infarction: Secondary | ICD-10-CM

## 2022-06-12 MED ORDER — IOPAMIDOL (ISOVUE-370) INJECTION 76%
75.0000 mL | Freq: Once | INTRAVENOUS | Status: AC | PRN
Start: 2022-06-12 — End: 2022-06-12
  Administered 2022-06-12: 75 mL via INTRAVENOUS

## 2022-06-13 ENCOUNTER — Telehealth (INDEPENDENT_AMBULATORY_CARE_PROVIDER_SITE_OTHER): Payer: Medicare Other | Admitting: Nurse Practitioner

## 2022-06-13 ENCOUNTER — Encounter (HOSPITAL_BASED_OUTPATIENT_CLINIC_OR_DEPARTMENT_OTHER): Payer: Self-pay | Admitting: Nurse Practitioner

## 2022-06-13 DIAGNOSIS — R29818 Other symptoms and signs involving the nervous system: Secondary | ICD-10-CM | POA: Diagnosis not present

## 2022-06-13 DIAGNOSIS — R1310 Dysphagia, unspecified: Secondary | ICD-10-CM | POA: Diagnosis not present

## 2022-06-13 DIAGNOSIS — N3944 Nocturnal enuresis: Secondary | ICD-10-CM

## 2022-06-13 NOTE — Assessment & Plan Note (Signed)
At this time the etiology of the new nocturia is unclear. I am concerned that this correlates with her recent other neurological changes/concerns. She did have a MRI yesterday, the results are pending at this time. I am concerned that she could be having some type of neurological episode while sleeping that is causing these new symptoms. It does not appear that she has any symptoms associated with cystitis at this time. I discussed with the patient that I will reach out to Marlowe Kays with neurology to discuss the new symptoms and follow-up with her later this afternoon. She will let me know if she begins to develop any new symptoms in the meantime.

## 2022-06-13 NOTE — Patient Instructions (Signed)
I will reach out to [facility] and see if she has any input on the symptoms and get back with you later today. If you have any new symptoms or changes, please let me know.

## 2022-06-13 NOTE — Assessment & Plan Note (Signed)
Recent episode of nasal regurgitation in the setting of a complex list of neurological concerns that have been developing over the past year or so. At this time she has only had two occurrences, but this is concerning given the other symptoms she is experiencing with memory changes, chronic vertigo, and personality changes noted by family. In addition she has had new nighttime urinary incontinence, which is also concerning. I discussed with the patient that I will reach out to neurology to discuss the new symptoms to determine if we can find a link. We may need to consider ENT evaluation or swallow evaluation for monitoring. I will touch base with the patient later today to discuss my conversation with neurology and recommendations going forward.

## 2022-06-13 NOTE — Progress Notes (Signed)
Virtual Visit Encounter mychart visit.   I connected with  Becky Augusta on 06/13/22 at  8:10 AM EDT by secure audio telemedicine application. I verified that I am speaking with the correct person using two identifiers.   I introduced myself as a Publishing rights manager with the practice. The limitations of evaluation and management by telemedicine discussed with the patient and the availability of in person appointments. The patient expressed verbal understanding and consent to proceed.  Participating parties in this visit include: Myself and patient  The patient is: Patient Location: Home I am: Provider Location: Office/Clinic Subjective:    CC and HPI: Julia Gutierrez is a 66 y.o. year old female presenting for new evaluation and treatment of nasal issues. Patient reports the following: She tells me she was drinking broth out of a bowl of noodles and twice the broth shot out of her nose.  She says that has never happened in the past.  She tells me she was drinking broth from a mug.  She reports there was no cough, no burn, no sensation of the liquid coming out.  She tells me afterward she blew her nose and the broth was present.  She reports that she has had the sensation of fullness when drinking other liquids since this first happened.   She has had two episodes of nighttime incontinence. This is a new symptom for her.  She tells me that she woke in the middle of the night and realized that she was wet.  She initially thought that it may be from increasing her dose of gabapentin to 300mg  (from 200mg ) and she went back down on the dose. The second occurrence she tells me she was on her usual dose of gabapentin. She denies dysuria, increased frequency, or other urinary symptoms.   She tells me about 3 weeks ago [place] and she had a sensation of a "headrush" that is hard for her to explain. She tells me it felt very intense. She pulled to the side of the road and  it went away completely.  She tells me that she just can't explain the sensation she experienced.  She tells me that it felt like a flash  It was not similar to her dizziness when she is standing She had no tingling, no weakness, no other symptoms. She tells me she was scared but it stopped instantly and lasted a split second.  She has never experienced this before   She denies any weakness, loss of time, speech difficulties, other swallowing difficulties, incontinence episodes in the daytime, coughing, choking.   Past medical history, Surgical history, Family history not pertinant except as noted below, Social history, Allergies, and medications have been entered into the medical record, reviewed, and corrections made.   Review of Systems:  All review of systems negative except what is listed in the HPI  Objective:    Alert and oriented x 4 Speaking in clear sentences with no shortness of breath. No distress.  Impression and Recommendations:    Problem List Items Addressed This Visit     Swallowing impairment - Primary    Recent episode of nasal regurgitation in the setting of a complex list of neurological concerns that have been developing over the past year or so. At this time she has only had two occurrences, but this is concerning given the other symptoms she is experiencing with memory changes, chronic vertigo, and personality changes noted by family. In addition she has had new  nighttime urinary incontinence, which is also concerning. I discussed with the patient that I will reach out to neurology to discuss the new symptoms to determine if we can find a link. We may need to consider ENT evaluation or swallow evaluation for monitoring. I will touch base with the patient later today to discuss my conversation with neurology and recommendations going forward.       Urinary incontinence, nocturnal enuresis    At this time the etiology of the new nocturia is unclear. I am concerned  that this correlates with her recent other neurological changes/concerns. She did have a MRI yesterday, the results are pending at this time. I am concerned that she could be having some type of neurological episode while sleeping that is causing these new symptoms. It does not appear that she has any symptoms associated with cystitis at this time. I discussed with the patient that I will reach out to Marlowe Kays with neurology to discuss the new symptoms and follow-up with her later this afternoon. She will let me know if she begins to develop any new symptoms in the meantime.       Neurological signs    Unusual episode of sudden "headrush" while driving lasting only a brief second. She does have difficulty verbalizing the sensation, but given her other neurological symptoms recently it is concerning. It could have been a moment of anxiety, but it does not appear there were any other anxiety symptoms or reasons for anxiety at the time. She is also not prone to panic/anxiety attacks. At this time, I am concerned for a correlation with her other recent neurological symptoms. I will reach out to neurology to discuss the new symptoms she is experiencing and develop a plan. She expressed understanding and appreciation of the input from both myself and neurology. I will readdress plan once consultation has been completed.        I discussed the assessment and treatment plan with the patient. The patient was provided an opportunity to ask questions and all were answered. The patient agreed with the plan and demonstrated an understanding of the instructions.   The patient was advised to call back or seek an in-person evaluation if the symptoms worsen or if the condition fails to improve as anticipated.  Follow-Up: TBD after consultation with neurology  I provided 30 minutes of non-face-to-face interaction with this non face-to-face encounter including intake, same-day documentation, and chart review.    Tollie Eth, NP , DNP, AGNP-c [facility] at [facility] [phone] (fax)

## 2022-06-13 NOTE — Assessment & Plan Note (Signed)
Unusual episode of sudden "headrush" while driving lasting only a brief second. She does have difficulty verbalizing the sensation, but given her other neurological symptoms recently it is concerning. It could have been a moment of anxiety, but it does not appear there were any other anxiety symptoms or reasons for anxiety at the time. She is also not prone to panic/anxiety attacks. At this time, I am concerned for a correlation with her other recent neurological symptoms. I will reach out to neurology to discuss the new symptoms she is experiencing and develop a plan. She expressed understanding and appreciation of the input from both myself and neurology. I will readdress plan once consultation has been completed.

## 2022-06-18 ENCOUNTER — Telehealth (HOSPITAL_BASED_OUTPATIENT_CLINIC_OR_DEPARTMENT_OTHER): Payer: Medicare Other | Admitting: Nurse Practitioner

## 2022-06-19 ENCOUNTER — Encounter: Payer: Self-pay | Admitting: Physician Assistant

## 2022-06-25 ENCOUNTER — Ambulatory Visit: Payer: Medicare Other

## 2022-06-25 DIAGNOSIS — I639 Cerebral infarction, unspecified: Secondary | ICD-10-CM

## 2022-06-28 NOTE — Telephone Encounter (Signed)
Cannot be at a gym, has to be at [facility], thanks

## 2022-06-28 NOTE — Progress Notes (Signed)
Echo is normal, thanks

## 2022-07-02 ENCOUNTER — Encounter (HOSPITAL_BASED_OUTPATIENT_CLINIC_OR_DEPARTMENT_OTHER): Payer: Self-pay

## 2022-07-02 ENCOUNTER — Ambulatory Visit: Payer: Medicare Other | Admitting: Physical Therapy

## 2022-07-02 ENCOUNTER — Ambulatory Visit (HOSPITAL_BASED_OUTPATIENT_CLINIC_OR_DEPARTMENT_OTHER): Payer: Medicare Other | Admitting: Nurse Practitioner

## 2022-07-03 ENCOUNTER — Encounter (HOSPITAL_BASED_OUTPATIENT_CLINIC_OR_DEPARTMENT_OTHER): Payer: Self-pay | Admitting: Nurse Practitioner

## 2022-07-03 NOTE — Assessment & Plan Note (Signed)
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 Unfortunately, our efforts to help relieve her symptoms have been unsuccessful. This is definitely a component of her depressive symptoms. For now, we will continue treatment with meclizine and PRN clonazepam for severe conditions. Will continue to provide supportive care and monitor closely.

## 2022-07-11 ENCOUNTER — Other Ambulatory Visit (HOSPITAL_BASED_OUTPATIENT_CLINIC_OR_DEPARTMENT_OTHER): Payer: Self-pay

## 2022-07-24 ENCOUNTER — Other Ambulatory Visit (HOSPITAL_COMMUNITY): Payer: Self-pay

## 2022-08-01 ENCOUNTER — Other Ambulatory Visit (HOSPITAL_COMMUNITY): Payer: Self-pay

## 2022-08-05 ENCOUNTER — Encounter (HOSPITAL_BASED_OUTPATIENT_CLINIC_OR_DEPARTMENT_OTHER): Payer: Self-pay

## 2022-08-05 ENCOUNTER — Emergency Department (HOSPITAL_BASED_OUTPATIENT_CLINIC_OR_DEPARTMENT_OTHER)
Admission: EM | Admit: 2022-08-05 | Discharge: 2022-08-05 | Discharge status: Against Medical Advice | Payer: 59 | Attending: Emergency Medicine | Admitting: Emergency Medicine

## 2022-08-05 ENCOUNTER — Emergency Department (HOSPITAL_BASED_OUTPATIENT_CLINIC_OR_DEPARTMENT_OTHER): Payer: 59

## 2022-08-05 ENCOUNTER — Other Ambulatory Visit: Payer: Self-pay

## 2022-08-05 ENCOUNTER — Encounter (HOSPITAL_COMMUNITY): Payer: Self-pay

## 2022-08-05 ENCOUNTER — Ambulatory Visit (HOSPITAL_BASED_OUTPATIENT_CLINIC_OR_DEPARTMENT_OTHER)
Admission: RE | Admit: 2022-08-05 | Discharge: 2022-08-05 | Disposition: A | Discharge status: Home / Self Care | Payer: Medicare Other | Source: Ambulatory Visit | Attending: Emergency Medicine | Admitting: Emergency Medicine

## 2022-08-05 DIAGNOSIS — Z79899 Other long term (current) drug therapy: Secondary | ICD-10-CM | POA: Diagnosis not present

## 2022-08-05 DIAGNOSIS — D72829 Elevated white blood cell count, unspecified: Secondary | ICD-10-CM | POA: Insufficient documentation

## 2022-08-05 DIAGNOSIS — Z5329 Procedure and treatment not carried out because of patient's decision for other reasons: Secondary | ICD-10-CM | POA: Diagnosis not present

## 2022-08-05 DIAGNOSIS — I10 Essential (primary) hypertension: Secondary | ICD-10-CM | POA: Insufficient documentation

## 2022-08-05 DIAGNOSIS — R42 Dizziness and giddiness: Secondary | ICD-10-CM | POA: Diagnosis present

## 2022-08-05 DIAGNOSIS — E039 Hypothyroidism, unspecified: Secondary | ICD-10-CM | POA: Insufficient documentation

## 2022-08-05 DIAGNOSIS — R0789 Other chest pain: Secondary | ICD-10-CM | POA: Insufficient documentation

## 2022-08-05 DIAGNOSIS — Z7982 Long term (current) use of aspirin: Secondary | ICD-10-CM | POA: Diagnosis not present

## 2022-08-05 DIAGNOSIS — B0221 Postherpetic geniculate ganglionitis: Secondary | ICD-10-CM

## 2022-08-05 LAB — COMPREHENSIVE METABOLIC PANEL
ALT: 11 U/L (ref 0–44)
AST: 13 U/L — ABNORMAL LOW (ref 15–41)
Albumin: 4 g/dL (ref 3.5–5.0)
Alkaline Phosphatase: 64 U/L (ref 38–126)
Anion gap: 8 (ref 5–15)
BUN: 16 mg/dL (ref 8–23)
CO2: 29 mmol/L (ref 22–32)
Calcium: 9.1 mg/dL (ref 8.9–10.3)
Chloride: 104 mmol/L (ref 98–111)
Creatinine, Ser: 0.97 mg/dL (ref 0.44–1.00)
GFR, Estimated: >60 mL/min (ref >60)
Glucose, Bld: 93 mg/dL (ref 70–99)
Potassium: 3.5 mmol/L (ref 3.5–5.1)
Sodium: 141 mmol/L (ref 135–145)
Total Bilirubin: 0.5 mg/dL (ref 0.3–1.2)
Total Protein: 7.6 g/dL (ref 6.5–8.1)

## 2022-08-05 LAB — CBC WITH DIFFERENTIAL/PLATELET
Abs Immature Granulocytes: 0.03 10*3/uL (ref 0.00–0.07)
Basophils Absolute: 0.1 10*3/uL (ref 0.0–0.1)
Basophils Relative: 1 %
Eosinophils Absolute: 0.6 10*3/uL — ABNORMAL HIGH (ref 0.0–0.5)
Eosinophils Relative: 5 %
HCT: 40.6 % (ref 36.0–46.0)
Hemoglobin: 13.2 g/dL (ref 12.0–15.0)
Immature Granulocytes: 0 %
Lymphocytes Relative: 31 %
Lymphs Abs: 3.3 10*3/uL (ref 0.7–4.0)
MCH: 28.5 pg (ref 26.0–34.0)
MCHC: 32.5 g/dL (ref 30.0–36.0)
MCV: 87.7 fL (ref 80.0–100.0)
Monocytes Absolute: 0.9 10*3/uL (ref 0.1–1.0)
Monocytes Relative: 8 %
Neutro Abs: 5.8 10*3/uL (ref 1.7–7.7)
Neutrophils Relative %: 55 %
Platelets: 319 10*3/uL (ref 150–400)
RBC: 4.63 MIL/uL (ref 3.87–5.11)
RDW: 15.2 % (ref 11.5–15.5)
WBC: 10.7 10*3/uL — ABNORMAL HIGH (ref 4.0–10.5)
nRBC: 0 % (ref 0.0–0.2)

## 2022-08-05 LAB — TROPONIN I (HIGH SENSITIVITY)
Troponin I (High Sensitivity): 9 ng/L (ref <18)
Troponin I (High Sensitivity): 10 ng/L (ref <18)

## 2022-08-05 MED ORDER — LIDOCAINE VISCOUS HCL 2 % MT SOLN
15.0000 mL | Freq: Once | OROMUCOSAL | Status: AC
  Administered 2022-08-05: 15 mL via ORAL
  Filled 2022-08-05: qty 15

## 2022-08-05 MED ORDER — METOCLOPRAMIDE HCL 5 MG/ML IJ SOLN
10.0000 mg | Freq: Once | INTRAMUSCULAR | Status: AC
  Administered 2022-08-05: 10 mg via INTRAVENOUS
  Filled 2022-08-05: qty 2

## 2022-08-05 MED ORDER — DIPHENHYDRAMINE HCL 50 MG/ML IJ SOLN
12.5000 mg | Freq: Once | INTRAMUSCULAR | Status: AC
  Administered 2022-08-05: 12.5 mg via INTRAVENOUS
  Filled 2022-08-05: qty 1

## 2022-08-05 MED ORDER — IOHEXOL 350 MG/ML SOLN: 100.0000 mL | Freq: Once | INTRAVENOUS | Status: DC | PRN

## 2022-08-05 MED ORDER — LACTATED RINGERS IV BOLUS
500.0000 mL | Freq: Once | INTRAVENOUS | Status: AC
  Administered 2022-08-05: 500 mL via INTRAVENOUS

## 2022-08-05 MED ORDER — ACETAMINOPHEN 500 MG PO TABS
1000.0000 mg | ORAL_TABLET | Freq: Once | ORAL | Status: AC
  Administered 2022-08-05: 1000 mg via ORAL
  Filled 2022-08-05: qty 2

## 2022-08-05 MED ORDER — MECLIZINE HCL 25 MG PO TABS
25.0000 mg | ORAL_TABLET | Freq: Once | ORAL | Status: AC
  Administered 2022-08-05: 25 mg via ORAL
  Filled 2022-08-05: qty 1

## 2022-08-05 MED ORDER — ALUM & MAG HYDROXIDE-SIMETH 200-200-20 MG/5ML PO SUSP
30.0000 mL | Freq: Once | ORAL | Status: AC
  Administered 2022-08-05: 30 mL via ORAL
  Filled 2022-08-05: qty 30

## 2022-08-05 NOTE — ED Notes (Addendum)
Pt left AMA, Provider aware and had a discussion. Pt understood discharge paperwork and verbally understood.

## 2022-08-05 NOTE — ED Notes (Signed)
Pt requested to be discharged. Provider notified.

## 2022-08-05 NOTE — ED Provider Notes (Signed)
[facility] EMERGENCY DEPT Provider Note   CSN: [id number] Arrival date & time: 08/05/22  1311     History {Add pertinent medical, surgical, social history, OB history to HPI:1} Chief Complaint  Patient presents with   Dizziness   Chest Pain    Julia Gutierrez is a 66 y.o. female.   Dizziness Associated symptoms: chest pain   Chest Pain Associated symptoms: dizziness        Home Medications Prior to Admission medications   Medication Sig Start Date End Date Taking? Authorizing Provider  acetaminophen (TYLENOL) 325 MG tablet Take 650 mg by mouth every 6 (six) hours as needed.    [provider]  amLODipine (NORVASC) 5 MG tablet Take 1 tablet (5 mg total) by mouth daily. 12/09/21   Tollie Eth, NP  aspirin 325 MG tablet Take 325 mg by mouth daily.    [provider]  chlorhexidine (PERIDEX) 0.12 % solution Use as directed 15 mLs in the mouth or throat 2 (two) times daily. 02/20/22   Early, Sung Amabile, NP  clonazePAM (KLONOPIN) 1 MG tablet Take 1 tablet (1 mg total) by mouth 2 (two) times daily as needed (chronic vertigo). 06/11/22   Early, Sung Amabile, NP  clonazePAM (KLONOPIN) 1 MG tablet Take 1 tablet (1 mg total) by mouth 2 (two) times daily as needed (chronic vertigo). 06/05/22   Tollie Eth, NP  gabapentin (NEURONTIN) 100 MG capsule Take 1 capsule (100 mg total) by mouth 2 (two) times daily AND 3 capsules (300 mg total) at bedtime. Use as needed for neuropathic pain. 02/04/22   Tollie Eth, NP  hydrochlorothiazide (HYDRODIURIL) 25 MG tablet Take 1 tablet (25 mg total) by mouth daily. 01/07/22   Tollie Eth, NP  Multiple Vitamins-Minerals (MULTIVITAMIN WITH MINERALS) tablet Take 1 tablet by mouth daily.    [provider]  sertraline (ZOLOFT) 50 MG tablet Take 1.5 tablets (75 mg total) by mouth at bedtime. 05/23/22   Tollie Eth, NP  Vitamin D, Ergocalciferol, (DRISDOL) 1.25 MG (50000 UNIT) CAPS capsule Take 1 capsule (50,000 Units total) by  mouth every 7 (seven) days. Take for 12 total doses (weeks) then can transition to 1000 units OTC supplement daily 02/22/22   Early, Sung Amabile, NP      Allergies    Promethazine    Review of Systems   Review of Systems  Cardiovascular:  Positive for chest pain.  Neurological:  Positive for dizziness.    Physical Exam Updated Vital Signs BP (!) 145/76   Pulse (!) 57   Temp 98 F (36.7 C) (Oral)   Resp 17   Ht 5\' 2"  (1.575 m)   Wt 68 kg   SpO2 98%   BMI 27.44 kg/m  Physical Exam  ED Results / Procedures / Treatments   Labs (all labs ordered are listed, but only abnormal results are displayed) Labs Reviewed  CBC WITH DIFFERENTIAL/PLATELET - Abnormal; Notable for the following components:      Result Value   WBC 10.7 (*)    Eosinophils Absolute 0.6 (*)    All other components within normal limits  COMPREHENSIVE METABOLIC PANEL - Abnormal; Notable for the following components:   AST 13 (*)    All other components within normal limits  TROPONIN I (HIGH SENSITIVITY)  TROPONIN I (HIGH SENSITIVITY)    EKG None  Radiology No results found.  Procedures Procedures  {Document cardiac monitor, telemetry assessment procedure when appropriate:1}  Medications Ordered in ED Medications -  No data to display  ED Course/ Medical Decision Making/ A&P                           Medical Decision Making Amount and/or Complexity of Data Reviewed Labs: ordered. Radiology: ordered.  Risk OTC drugs. Prescription drug management. Decision regarding hospitalization.   ***  After admission, I was informed that the patient wished to [place].  I informed her of the specialty recommendation for admission for stroke work-up to include MRI imaging and CT angiogram.  The patient states that she felt symptomatically improved following meclizine administration.  Additionally, her chest pain and discomfort resolved with Maalox.  I informed her that we could not rule  out stroke without MRI imaging and while reassuring that her symptoms have improved, this diagnosis could be missed.  The patient endorsed understanding of this and still requested to sign out AMA.  {Document critical care time when appropriate:1} {Document review of labs and clinical decision tools ie heart score, Chads2Vasc2 etc:1}  {Document your independent review of radiology images, and any outside records:1} {Document your discussion with family members, caretakers, and with consultants:1} {Document social determinants of health affecting pt's care:1} {Document your decision making why or why not admission, treatments were needed:1} Final Clinical Impression(s) / ED Diagnoses Final diagnoses:  None    Rx / DC Orders ED Discharge Orders     None

## 2022-08-05 NOTE — Progress Notes (Signed)
Plan of Care Note for accepted transfer   Patient: Julia Gutierrez MRN: [id number]   [place]: 08/05/2022  Facility requesting transfer: [facility] Requesting Provider: Armandina Gemma Reason for transfer: Vertigo  Facility course: Patient with h/o HTN, hypothyroidism, CVA, and vertigo presenting with vertigo.  Nystagmus, following to the right.  Neurology recommends MRI.  CTA will be done there.  Unable to ambulate so likely needs obs and PT/OT evaluations.  Plan of care: The patient is accepted for observation to Telemetry unit, at [facility].   Author: Karmen Bongo, MD 08/05/2022  Check [email or web address] for on-call coverage.  Nursing staff, Please call [place] number on Amion as soon as patient's arrival, so appropriate admitting provider can evaluate the pt.

## 2022-08-05 NOTE — ED Triage Notes (Signed)
Pt has chronic vertigo and states worse today.  Pt c/o chest tightness which began at noon today.  +Nausea Slight radiation to left arm

## 2022-08-08 ENCOUNTER — Other Ambulatory Visit (HOSPITAL_BASED_OUTPATIENT_CLINIC_OR_DEPARTMENT_OTHER): Payer: Self-pay

## 2022-08-09 ENCOUNTER — Other Ambulatory Visit (HOSPITAL_BASED_OUTPATIENT_CLINIC_OR_DEPARTMENT_OTHER): Payer: Self-pay

## 2022-08-09 ENCOUNTER — Other Ambulatory Visit (HOSPITAL_COMMUNITY): Payer: Self-pay

## 2022-08-23 ENCOUNTER — Ambulatory Visit (HOSPITAL_BASED_OUTPATIENT_CLINIC_OR_DEPARTMENT_OTHER): Payer: Medicare Other | Admitting: Nurse Practitioner

## 2022-09-04 ENCOUNTER — Other Ambulatory Visit (HOSPITAL_BASED_OUTPATIENT_CLINIC_OR_DEPARTMENT_OTHER): Payer: Self-pay

## 2022-09-04 ENCOUNTER — Ambulatory Visit: Payer: Medicare Other | Admitting: Family Medicine

## 2022-09-10 ENCOUNTER — Ambulatory Visit: Payer: Medicare Other | Admitting: Family Medicine

## 2022-09-10 ENCOUNTER — Telehealth: Payer: Self-pay | Admitting: Nurse Practitioner

## 2022-09-10 NOTE — Telephone Encounter (Signed)
Pt's car broke down and so she is unable to make New Patient visit with Dr. Janee Morn. We rescheduled her. This is her first time, letter sent

## 2022-09-10 NOTE — Telephone Encounter (Signed)
1st no show for new pt (car broke down), fee waived, letter sent

## 2022-09-12 ENCOUNTER — Ambulatory Visit: Payer: Medicare Other | Admitting: Physician Assistant

## 2022-09-12 NOTE — Progress Notes (Incomplete)
Assessment/Plan:   Mild Cognitive Impairment   Julia Gutierrez is a very pleasant 66 y.o. RH female with  a history of hypertension, hyperlipidemia, anxiety, depression, history of migraines, benign paroxysmal vertigo, B12 deficiency, cervical spondylosis, history of Bell's palsy in February 2023, chronic mild leukocytosis, of Ramsay-Hunt syndrome, history of benign positional vertigo on vestibular therapy, prior CVA seen on MRI with recent presentation for vertigo at the ED***with and a history of memory difficulties presenting today in follow-up for evaluation of memory loss. Patient is on *** MRI of the brain personally reviewed 4 mm subacute infarct in the left parieto-occipital white matter as well as some background advanced cerebral white matter chronic small vessel disease and chronic lacunar infarct within the left thalamus and mild generalized cerebral atrophy.  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    Subjective:   This patient is accompanied in the office by ***  who supplements the history. Previous records as well as any outside records available were reviewed prior to todays visit.  ***    Any changes in memory since last visit? repeats oneself?  Endorsed Disoriented when walking into a room?  Patient denies   Leaving objects in unusual places?  Patient denies   Ambulates  with difficulty?   Patient denies   Recent falls?  Patient denies   Any head injuries?  Patient denies   History of seizures?   Patient denies   Wandering behavior?  Patient denies   Patient drives?  *** Any mood changes since last visit?  Patient denies   Any worsening  depression?:  Patient denies   Hallucinations?  Patient denies   Paranoia?  Patient denies   Patient reports that sleeps well without vivid dreams, REM behavior or sleepwalking   History of sleep apnea?  Patient denies   Any hygiene concerns?  Patient denies   Independent of bathing and dressing?  Endorsed  Does the patient needs help with medications?  In charge *** Who is in charge of the finances?   is in charge   *** Any changes in appetite?  Patient denies ***   Patient have trouble swallowing? Patient denies   Does the patient cook?  Patient denies   Any kitchen accidents such as leaving the stove on? Patient denies   Any headaches?  Patient denies   Double vision?  She presented to the emergency department on 08/05/2022 with worsening vertigo with bidirectional nystagmus and some ataxia, gait instability when trying to stand.  At the time she had some focality on [facility] testing to the left, Epley maneuver was attempted without initial resolution of symptoms.  Labs were unremarkable and CT of the head was negative for acute intracranial abnormalities, but advanced chronic small vessel ischemic changes in the cerebral white matter and chronic lacunar infarcts within the bilateral cerebral hemispheric white matter and left thalamus with mild generalized cerebral atrophy was noted.  Neurology was called for consult, patient left AMA so no further workup was performed at that time. Any focal numbness or tingling?  Patient denies   Chronic back pain Patient denies   Unilateral weakness?  Patient denies  Any tremors?  Patient denies   Any history of anosmia?  Patient denies   Any incontinence of urine?  Patient denies   Any bowel dysfunction?   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 with approximately 60% stenosis of the proximal left ICA    Past Medical History:  Diagnosis Date   Acute cystitis without hematuria 08/28/2021   Cervical spondylosis 05/04/2021   Cervicogenic headache 04/13/2021   Chronic fatigue 04/13/2021   Encounter to establish care 04/13/2021   Enlarged glands 06/18/2021   Hypertension    Hypothyroidism    IBS (irritable bowel syndrome)    Increased thirst 04/13/2021   Mouth dryness 04/13/2021   Palpitations 04/13/2021   Vertigo 04/13/2021     Past Surgical History:  Procedure Laterality Date   CHOLECYSTECTOMY       PREVIOUS MEDICATIONS:   CURRENT MEDICATIONS:  Outpatient Encounter Medications as of 09/12/2022  Medication Sig   acetaminophen (TYLENOL) 325 MG tablet Take 650 mg by mouth every 6 (six) hours as needed.   amLODipine (NORVASC) 5 MG tablet Take 1 tablet (5 mg total) by mouth daily.   aspirin 325 MG tablet Take 325 mg by mouth daily.   chlorhexidine (PERIDEX) 0.12 % solution Use as directed 15 mLs in the mouth or throat 2 (two) times daily.   clonazePAM (KLONOPIN) 1 MG tablet Take 1 tablet (1 mg total) by mouth 2 (two) times daily as needed (chronic vertigo).   clonazePAM (KLONOPIN) 1 MG tablet Take 1 tablet (1 mg total) by mouth 2 (two) times daily as needed (chronic vertigo).   gabapentin (NEURONTIN) 100 MG capsule Take 1 capsule (100 mg total) by mouth 2 (two) times daily AND 3 capsules (300 mg total) at bedtime. Use as needed for neuropathic pain.   hydrochlorothiazide (HYDRODIURIL) 25 MG tablet Take 1 tablet (25 mg total) by mouth daily.   Multiple Vitamins-Minerals (MULTIVITAMIN WITH MINERALS) tablet Take 1 tablet by mouth daily.   sertraline (ZOLOFT) 50 MG tablet  Take 1.5 tablets (75 mg total) by mouth at bedtime.   Vitamin D, Ergocalciferol, (DRISDOL) 1.25 MG (50000 UNIT) CAPS capsule Take 1 capsule (50,000 Units total) by mouth every 7 (seven) days. Take for 12 total doses (weeks) then can transition to 1000 units OTC supplement daily   No facility-administered encounter medications on file as of 09/12/2022.     Objective:     PHYSICAL EXAMINATION:    VITALS:  There were no vitals filed for this visit.  GEN:  The patient appears stated age and is in NAD. HEENT:  Normocephalic, atraumatic.   Neurological examination:  General: NAD, well-groomed, appears stated age. Orientation: The patient is alert. Oriented to person, place and date Cranial nerves: There is good facial symmetry.The speech is fluent and clear. No aphasia or dysarthria. Fund of knowledge is appropriate. Recent memory impaired and remote memory is normal.  Attention and concentration are normal.  Able to name objects and repeat phrases.  Hearing is intact to conversational tone.    Sensation: Sensation is intact to light  touch throughout Motor: Strength is at least antigravity x4. Tremors: none  DTR's 2/4 in UE/LE      05/01/2022   10:00 AM  Montreal Cognitive Assessment   Visuospatial/ Executive (0/5) 4  Naming (0/3) 3  Attention: Read list of digits (0/2) 2  Attention: Read list of letters (0/1) 1  Attention: Serial 7 subtraction starting at 100 (0/3) 3  Language: Repeat phrase (0/2) 1  Language : Fluency (0/1) 0  Abstraction (0/2) 2  Delayed Recall (0/5) 5  Orientation (0/6) 6  Total 27  Adjusted Score (based on education) 27        No data to display             Movement examination: Tone: There is normal tone in the UE/LE Abnormal movements:  no tremor.  No myoclonus.  No asterixis.   Coordination:  There is no decremation with RAM's. Normal finger to nose  Gait and Station: The patient has no difficulty arising out of a deep-seated chair without the  use of the hands. The patient's stride length is good.  Gait is cautious and narrow.   Thank you for allowing [place] the opportunity to participate in the care of this nice patient. Please do not hesitate to contact us for any questions or concerns.   Total time spent on today's visit was *** minutes dedicated to this patient today, preparing to see patient, examining the patient, ordering tests and/or medications and counseling the patient, documenting clinical information in the EHR or other health record, independently interpreting results and communicating results to the patient/family, discussing treatment and goals, answering patient's questions and coordinating care.  Cc:  Tollie Eth, NP  Marlowe Kays 09/12/2022 6:52 AM

## 2022-09-17 ENCOUNTER — Encounter: Payer: Self-pay | Admitting: Family Medicine

## 2022-09-17 ENCOUNTER — Ambulatory Visit (INDEPENDENT_AMBULATORY_CARE_PROVIDER_SITE_OTHER): Payer: Medicare Other | Admitting: Family Medicine

## 2022-09-17 VITALS — BP 122/78 | HR 90 | Temp 97.8°F | Ht 62.5 in | Wt 160.8 lb

## 2022-09-17 DIAGNOSIS — I10 Essential (primary) hypertension: Secondary | ICD-10-CM

## 2022-09-17 DIAGNOSIS — Z8673 Personal history of transient ischemic attack (TIA), and cerebral infarction without residual deficits: Secondary | ICD-10-CM

## 2022-09-17 DIAGNOSIS — Z09 Encounter for follow-up examination after completed treatment for conditions other than malignant neoplasm: Secondary | ICD-10-CM

## 2022-09-17 DIAGNOSIS — F321 Major depressive disorder, single episode, moderate: Secondary | ICD-10-CM | POA: Diagnosis not present

## 2022-09-17 DIAGNOSIS — R42 Dizziness and giddiness: Secondary | ICD-10-CM | POA: Diagnosis not present

## 2022-09-17 HISTORY — DX: Essential (primary) hypertension: I10

## 2022-09-17 NOTE — Assessment & Plan Note (Signed)
Stable Continue amlodipine and hydrochlorothiazide

## 2022-09-17 NOTE — Patient Instructions (Signed)
It was a pleasure to meet you today. I hope you holidays go well.   For vertigo we are referring to ENT.

## 2022-09-17 NOTE — Progress Notes (Signed)
Assessment/Plan:  At today's visit, we discussed treatment options, associated risk and benefits, and engage in counseling as needed.  Additionally the following were reviewed: Past medical records, past medical and surgical history, family and social background, as well as relevant laboratory results, imaging findings, and specialty notes, where applicable.  This message was generated using dictation software, and as a result, it may contain unintentional typos or errors.  Nevertheless, extensive effort was made to accurately convey at the pertinent aspects of the patient visit.    There may have been are other unrelated non-urgent complaints, but due to the busy schedule and the amount of time already spent with her, time does not permit to address these issues at today's visit. Another appointment may have or has been requested to review these additional issues.  Problem List Items Addressed This Visit       Other   Vertigo   Relevant Orders   Ambulatory referral to ENT   Other Visit Diagnoses     Hospital discharge follow-up    -  Primary   History of CVA (cerebrovascular accident)       Current moderate episode of major depressive disorder without prior episode (HCC)          At length we discussed patient's recent ED visit.  Discussed ED concern for possible recurrence of stroke.  Patient was uninterested to pursue this further and felt that she would be able to follow-up with this with her neurologist.  Patient's primary concern was regarding heart health.  Reviewed chart and stated that patient had negative troponins and EKG showed sinus rhythm without signs of acute STEMI.  Briefly discussed that there were some mild ST changes, but these were most likely chronic especially given negative troponins.  Patient appreciated this discussion.  Regarding depression, recommend that we follow-up with patient at next visit discussed medication management versus therapy.  Patient  appreciated this and agreed with plan.  Patient declines refills today    Subjective:  HPI:  Julia Gutierrez is a 66 y.o. female who has Encounter for annual physical exam; Bilateral dry eyes; Anxiety and depression; Vision changes; Migraine with status migrainosus, not intractable; Tooth infection; Ramsay Hunt auricular syndrome; Benign paroxysmal vertigo of both ears; Chronic dryness of both eyes; Dry mouth; [place] (HCC); Rib pain on right side; B12 deficiency; Colicky RUQ abdominal pain; Adjustment reaction with anxiety and depression; Memory changes; Swallowing impairment; Urinary incontinence, nocturnal enuresis; Neurological signs; and Vertigo on their problem list..   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   She presents with chief complaint of Establish Care (ED Follow up lab results from 08/05/22) .  Today patient's most pressing concern is to discuss review of labs.  Patient has family history of heart disease and wanted to ensure that her heart was okay.  Follow up ER visit  Patient was seen in ER for chest pain and dizziness chest pain and dizziness on 08/05/2022. She was treated for vertigo, workup for rule out STEMI, concern for acute CVA. Treatment for this included  Vertigo: Given meclizine, Reglan, Benadryl, fluids, had improvement of vertigo Chest pain: Given Mylanta, EKG with  normal sinus rhythm, troponin x 2 normal, CMP and CBC grossly  unremarkable, chest pain resolved Concern for CVA: Patient with a history of CVA, CT head normal, concern for stroke, neurology consulted and admission was recommended, CTA head and neck ordered however,, patient was feeling better and left AMA  She reports this condition is  improved since visit to ED .  -----------------------------------------------------------------------------------------  Vertigo.  Patient has history of chronic vertigo.  She has seen neurology for this.  She states that workup did not show anything occlusive.  She was told that it was result of "nerve damage".  Patient has tried meclizine and Klonopin in the past.  She is also tried vestibular rehab.  She has had no improvement in symptoms.  She has not seen ENT.  She self discontinued Klonopin several months ago.  Dementia.  Patient has a history of memory challenges with workup from [facility].  An MRI was obtained did not show evidence of chronic ischemic changes and CVA, age-indeterminate.  Diagnosis possible vascular dementia?  Patient works daily but states that this diagnosis works as depression.  Patient is on aggressive secondary risk factor management including ASA, lipid management, blood pressure management.  Patient has been referred to cardiology to further assess cardiac involvement for embolic stroke.  It does not appear that she has established yet with cardiology.  Depression.  Patient has worsening depression from chronic vertigo and diagnosis of dementia.  She has tried Zoloft and has had some improvement in mood.  Patient had had discussion of referral to psychiatry, however says that she was never referred.  Patient has not tried therapy.   Hypertension, established problem, Stable BP Readings from Last 3 Encounters:  09/17/22 122/78  08/05/22 (!) 146/79  06/04/22 132/80    Current Medications: Amlodipine, hydrochlorothiazide, compliant without side effects.   ROS: Denies any chest pain,  shortness of breath, dyspnea on exertion, leg edema.      Past Surgical History:  Procedure Laterality Date   CHOLECYSTECTOMY      Outpatient Medications Prior to Visit  Medication Sig Dispense Refill   acetaminophen (TYLENOL) 325 MG tablet Take 650 mg by mouth every 6 (six) hours as needed.     amLODipine (NORVASC) 5 MG tablet Take 1 tablet (5 mg total) by mouth daily. 90 tablet 3   aspirin 325 MG tablet Take 325 mg by mouth daily.     chlorhexidine (PERIDEX) 0.12 % solution Use as directed 15 mLs in the mouth or throat 2 (two) times daily. 1892 mL 5   gabapentin (NEURONTIN) 100 MG capsule Take 1 capsule (100 mg total) by mouth 2 (two) times daily AND 3 capsules (300 mg total) at bedtime. Use as needed for neuropathic pain. 150 capsule 3   hydrochlorothiazide (HYDRODIURIL) 25 MG tablet Take 1 tablet (25 mg total) by mouth daily. 90 tablet 3   Multiple Vitamins-Minerals (MULTIVITAMIN WITH MINERALS) tablet Take 1 tablet by mouth daily.     sertraline (ZOLOFT) 50 MG tablet Take 1.5 tablets (75 mg total) by mouth at bedtime. 135 tablet 3   Vitamin D, Ergocalciferol, (DRISDOL) 1.25 MG (50000 UNIT) CAPS capsule Take 1 capsule (50,000 Units total) by mouth every 7 (seven) days. Take for 12 total doses (weeks) then can transition to 1000 units OTC supplement daily (Patient not taking: Reported on 09/17/2022) 12 capsule 0   clonazePAM (KLONOPIN) 1 MG tablet Take 1 tablet (1 mg total) by mouth 2 (two) times daily as needed (chronic vertigo). 60 tablet  2   clonazePAM (KLONOPIN) 1 MG tablet Take 1 tablet (1 mg total) by mouth 2 (two) times daily as needed (chronic vertigo). 60 tablet 2   No facility-administered medications prior to visit.    Family History  Problem Relation Age of Onset   Diabetes Mother    Hypertension Mother    Heart disease Mother    Arthritis Mother    Obesity Mother    Heart disease Father    Hypertension Father    Stroke Sister    Heart disease Sister     Hypertension Sister    Kidney failure Sister    Alcohol abuse Sister    Drug abuse Sister    Stroke Brother    Heart disease Brother    Hypertension Brother    Alcohol abuse Brother    Depression Brother    Drug abuse Brother    Early death Brother    Obesity Brother    Heart disease Brother    Hypertension Brother    Alcohol abuse Brother    Drug abuse Brother    Stroke Brother     Social History   Socioeconomic History   Marital status: Divorced    Spouse name: Not on file   Number of children: 3   Years of education: 18   Highest education level: Master's degree (e.g., MA, MS, MEng, MEd, MSW, MBA)  Occupational History   Occupation: Holiday representative  Tobacco Use   Smoking status: Never    Passive exposure: Never   Smokeless tobacco: Never  Vaping Use   Vaping Use: Never used  Substance and Sexual Activity   Alcohol use: Never    Comment: Glass of wine once a year   Drug use: Never   Sexual activity: Not Currently    Birth control/protection: Abstinence  Other Topics Concern   Not on file  Social History Narrative   Right handed   Caffeine 3 cups a day tea and energy drinks   Lives alone one level home       Social Determinants of Health   Financial Resource Strain: Not on file  Food Insecurity: Not on file  Transportation Needs: Not on file  Physical Activity: Not on file  Stress: Not on file  Social Connections: Not on file  Intimate Partner Violence: Not on file                                                                                                 Objective:  Physical Exam: BP 122/78 (BP Location: Left Arm, Patient Position: Sitting, Cuff Size: Large)   Pulse 90   Temp 97.8 F (36.6 C) (Temporal)   Ht 5' 2.5" (1.588 m)   Wt 160 lb 12.8 oz (72.9 kg)   SpO2 94%   BMI 28.94 kg/m    General: No acute distress. Awake and conversant.  Eyes: Normal conjunctiva, anicteric. Round symmetric pupils.  ENT: Hearing grossly intact. No nasal  discharge.  Neck: Neck is supple. No masses or thyromegaly.  Respiratory: Respirations are non-labored. No auditory wheezing.  Skin: Warm. No rashes or ulcers.  Psych: Alert and oriented. Cooperative, Appropriate mood and affect, Normal judgment.  CV: No cyanosis or JVD MSK: Normal ambulation. No clubbing  Neuro: Sensation and CN II-XII grossly normal.        Garner Nash, MD, MS

## 2022-09-17 NOTE — Assessment & Plan Note (Signed)
Chronic, resistant to benzodiazepines Referral to ENT for possible assessment or additional therapy

## 2022-10-02 ENCOUNTER — Ambulatory Visit: Payer: Medicare Other | Admitting: Physician Assistant

## 2022-10-02 NOTE — Progress Notes (Incomplete)
Assessment/Plan:   Memory Impairment   Julia Gutierrez is a very pleasant 66 y.o. RH female with a history of hypertension, hyperlipidemia, anxiety, depression, history of migraines chronic vertigo with latest presentation to the ED on 08/05/2022 with a CT of the head without evidence of acute intracranial abnormalities, but showing advanced chronic small vessel ischemic changes within the cerebral white matter, as well as chronic lacunar infarcts within bilateral cerebral hemispheric white matter and left thalamus, and mild generalized cerebral atrophy. MRI has been recommended, but these was never performed because after admission, the patient left AMA patient also has a history of B12 deficiency, cervical spondylosis, history of Bell's palsy in February 2023, chronic mild leukocytosis, history of Ramsay Hunt syndrome presenting today in follow-up for evaluation of memory loss. Patient is not on antidementia medications.  She has chronic intermittent vertigo, has done vestibular therapy in the past and she is now resuming***   Recommendations:   Follow up in 3  months. Continue aspirating daily, recommend good control of cardiovascular risk factors Recommend vestibular therapy for chronic intermittent vertigo Referral to ENT for vertigo    Subjective:   This patient is accompanied in the office by ***  who supplements the history. Previous records as well as any outside records available were reviewed prior to todays visit.  ***She was last seen on 06/05/2022.  Her last MoCA on 04/25/2022 was 27/30.   Any changes in memory since last visit? repeats oneself?  Endorsed Disoriented when walking into a room?  Patient denies except occasionally not remembering what patient came to the room for ***  Leaving objects in unusual places?  Patient denies   Wandering behavior?   denies   Any personality changes since last visit?   denies   Any worsening depression?: denies   Hallucinations or  paranoia?  denies   Seizures?   denies    Any sleep changes?  Denies  vivid dreams, REM behavior or sleepwalking   Sleep apnea?   denies   Any hygiene concerns?   denies   Independent of bathing and dressing?  Endorsed  Does the patient needs help with medications? is in charge *** Who is in charge of the finances?   is in charge   *** Any changes in appetite?  denies ***   Patient have trouble swallowing?  denies   Does the patient cook?  Any kitchen accidents such as leaving the stove on?   denies   Any headaches?    denies   Vision changes? denies Chronic back pain  denies   Ambulates with difficulty?    denies   Recent falls or head injuries?    denies     Unilateral weakness, numbness or tingling?   denies   Any tremors?  denies   Any anosmia?    denies   Any incontinence of urine?  denies   Any bowel dysfunction?  denies      Patient lives  *** Does the patient drive?***     Prior MRI of the brain personally reviewed was remarkable for a 4 mm subacute infarct within the left parietal occipital white matter, chronic small vessel disease, chronic lacunar infarct within the left thalamus and mild generalized cerebral atrophy.  CT angio of the head and neck on 06/12/2022 was without intracranial large vessel occlusion, there is moderate stenosis in the right cavernosal and bilateral supraclinoid ICA, approximately 60% stenosis of the proximal left ICA, without additional significant stenosis in the neck.  There is no acute intracranial process.  Aortic atherosclerosis is noted.  Patient is on aspirin daily, and is also asymptomatic for TIA or stroke at this time.  Past Medical History:  Diagnosis Date   Acute cystitis without hematuria 08/28/2021   Allergy 2002   Anxiety and depression 04/13/2021   Cervical spondylosis 05/04/2021   Cervicogenic headache 04/13/2021   Chronic fatigue 04/13/2021   Depression    Encounter for annual physical exam 04/13/2021   Encounter to establish  care 04/13/2021   Enlarged glands 06/18/2021   Heart murmur    Hypertension    Hypothyroidism    IBS (irritable bowel syndrome)    Increased thirst 04/13/2021   Mouth dryness 04/13/2021   Palpitations 04/13/2021   Stroke (HCC) Oct 2023   Vertigo 04/13/2021     Past Surgical History:  Procedure Laterality Date   CHOLECYSTECTOMY       PREVIOUS MEDICATIONS:   CURRENT MEDICATIONS:  Outpatient Encounter Medications as of 10/02/2022  Medication Sig   acetaminophen (TYLENOL) 325 MG tablet Take 650 mg by mouth every 6 (six) hours as needed.   amLODipine (NORVASC) 5 MG tablet Take 1 tablet (5 mg total) by mouth daily.   aspirin 325 MG tablet Take 325 mg by mouth daily.   chlorhexidine (PERIDEX) 0.12 % solution Use as directed 15 mLs in the mouth or throat 2 (two) times daily.   gabapentin (NEURONTIN) 100 MG capsule Take 1 capsule (100 mg total) by mouth 2 (two) times daily AND 3 capsules (300 mg total) at bedtime. Use as needed for neuropathic pain.   hydrochlorothiazide (HYDRODIURIL) 25 MG tablet Take 1 tablet (25 mg total) by mouth daily.   Multiple Vitamins-Minerals (MULTIVITAMIN WITH MINERALS) tablet Take 1 tablet by mouth daily.   sertraline (ZOLOFT) 50 MG tablet Take 1.5 tablets (75 mg total) by mouth at bedtime.   Vitamin D, Ergocalciferol, (DRISDOL) 1.25 MG (50000 UNIT) CAPS capsule Take 1 capsule (50,000 Units total) by mouth every 7 (seven) days. Take for 12 total doses (weeks) then can transition to 1000 units OTC supplement daily (Patient not taking: Reported on 09/17/2022)   No facility-administered encounter medications on file as of 10/02/2022.     Objective:     PHYSICAL EXAMINATION:    VITALS:  There were no vitals filed for this visit.  GEN:  The patient appears stated age and is in NAD. HEENT:  Normocephalic, atraumatic.   Neurological examination:  General: NAD, well-groomed, appears stated age. Orientation: The patient is alert. Oriented to person, place  and date Cranial nerves: There is good facial symmetry.The speech is fluent and clear. No aphasia or dysarthria. Fund of knowledge is appropriate. Recent memory impaired and remote memory is normal.  Attention and concentration are normal.  Able to name objects and repeat phrases.  Hearing is intact to conversational tone.    Sensation: Sensation is intact to light touch throughout Motor: Strength is at least antigravity x4. Tremors: none  DTR's 2/4 in UE/LE      05/01/2022   10:00 AM  Montreal Cognitive Assessment   Visuospatial/ Executive (0/5) 4  Naming (0/3) 3  Attention: Read list of digits (0/2) 2  Attention: Read list of letters (0/1) 1  Attention: Serial 7 subtraction starting at 100 (0/3) 3  Language: Repeat phrase (0/2) 1  Language : Fluency (0/1) 0  Abstraction (0/2) 2  Delayed Recall (0/5) 5  Orientation (0/6) 6  Total 27  Adjusted Score (based on education) 27  No data to display             Movement examination: Tone: There is normal tone in the UE/LE Abnormal movements:  no tremor.  No myoclonus.  No asterixis.   Coordination:  There is no decremation with RAM's. Normal finger to nose  Gait and Station: The patient has no difficulty arising out of a deep-seated chair without the use of the hands. The patient's stride length is good.  Gait is cautious and narrow.   Thank you for allowing [place] the opportunity to participate in the care of this nice patient. Please do not hesitate to contact us for any questions or concerns.   Total time spent on today's visit was *** minutes dedicated to this patient today, preparing to see patient, examining the patient, ordering tests and/or medications and counseling the patient, documenting clinical information in the EHR or other health record, independently interpreting results and communicating results to the patient/family, discussing treatment and goals, answering patient's questions and coordinating care.  Cc:   Garnette Gunner, MD  Marlowe Kays 10/02/2022 8:13 AM

## 2022-10-11 ENCOUNTER — Other Ambulatory Visit (HOSPITAL_BASED_OUTPATIENT_CLINIC_OR_DEPARTMENT_OTHER): Payer: Self-pay

## 2022-10-16 ENCOUNTER — Other Ambulatory Visit (HOSPITAL_BASED_OUTPATIENT_CLINIC_OR_DEPARTMENT_OTHER): Payer: Self-pay

## 2022-10-16 ENCOUNTER — Ambulatory Visit (INDEPENDENT_AMBULATORY_CARE_PROVIDER_SITE_OTHER): Payer: Medicare Other | Admitting: Family Medicine

## 2022-10-16 ENCOUNTER — Encounter: Payer: Self-pay | Admitting: Family Medicine

## 2022-10-16 VITALS — BP 122/78 | HR 70 | Temp 97.5°F | Wt 166.4 lb

## 2022-10-16 DIAGNOSIS — R0609 Other forms of dyspnea: Secondary | ICD-10-CM

## 2022-10-16 DIAGNOSIS — U099 Post covid-19 condition, unspecified: Secondary | ICD-10-CM | POA: Diagnosis not present

## 2022-10-16 DIAGNOSIS — U071 COVID-19: Secondary | ICD-10-CM

## 2022-10-16 MED ORDER — FLUTICASONE PROPIONATE 50 MCG/ACT NA SUSP
2.0000 | Freq: Every day | NASAL | 6 refills | Status: DC
  Filled 2022-10-16: qty 16, 30d supply, fill #0
  Filled 2022-12-01 – 2022-12-10 (×2): qty 16, 30d supply, fill #1

## 2022-10-16 NOTE — Patient Instructions (Signed)
For COVID symptoms, we are ordering blood work and a chest x-ray.  For chest xray, go to:    [facility], [place], [place], [place] [address] Phone: [phone]  Use Flonase for nasal congestion.   For COVID, Quarantine at home for 5 days since the start of illness. After this you, may exit quarantine, but please still wear a mask indoors and outdoors for 5 more days.   Please be sure to drink plenty of fluids.   You may take the following OTC medications to help with symptoms:  For cough, use  cough syrups or other cough suppressants.  For headache, sore throat, fevers, muscle aches, chills, other pain, take ibuprofen or tylenol  For congestion, use nasal sprays, decongestants, or antihistamines  Please follow up if no improvement.   Go to ED if you have severe chest pain, fevers, shortness of breath or other worrisome symptoms.

## 2022-10-16 NOTE — Progress Notes (Signed)
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 The reported sensation during inspiration could be related to residual lung irritation or viral-mediated sensory impairment.  Plan:  Continue symptomatic treatment with ibuprofen and encourage complete rest to facilitate recovery. Prescribe fluticasone (FLONASE) for congestion symptoms to be taken as directed. Perform a complete blood count (CBC) and Basic Metabolic Panel (BMP) to check for abnormalities that may contribute to fatigue and malaise. Obtain a chest X-ray to rule out pneumonia. Provide a work note for rest as needed and recommend follow-up if symptoms persist or worsen beyond a week. Monitor for signs of improvement or deterioration, with instructions to seek immediate care should symptoms escalate.      Relevant Medications   fluticasone (FLONASE) 50 MCG/ACT nasal spray   Other Relevant Orders   DG Chest 2 View   CBC w/Diff (Completed)   Basic Metabolic Panel (BMET) (Completed)   B Nat Peptide   Other Visit Diagnoses     DOE (dyspnea on exertion)       Relevant Orders   B Nat Peptide       There are no discontinued medications.    Subjective:   Encounter date: 10/16/2022  Julia Gutierrez is a 67 y.o. female who has Encounter for annual physical exam; Bilateral dry eyes; Anxiety and depression; Vision changes; Migraine with status migrainosus, not intractable; Tooth infection; Ramsay Hunt auricular syndrome; Benign paroxysmal vertigo of both ears; Chronic dryness of both eyes; Dry mouth; [place] ([place]); Rib pain on right side; J69 deficiency; Colicky RUQ abdominal pain; Adjustment reaction with anxiety and depression; Memory impairment; Swallowing impairment; Urinary incontinence, nocturnal enuresis;  Neurological signs; Vertigo; Primary hypertension; and Post-COVID-19 syndrome on their problem list..   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    CHIEF COMPLAINT: Patient presents with persistent post-COVID symptoms including congestion, cough, body aches, and a sensation of breathing in toxic substances.  HISTORY OF PRESENT ILLNESS:  Problem 1: The patient tested positive for COVID-19 on December 30th and has experienced symptoms including congestion, persistent cough, and body aches. The cough has somewhat improved, but there is a persistent headache and a disturbing sensation during inspiration described as being similar to inhaling smog. The patient also mentioned ongoing chest soreness, which predates the COVID-19 infection, attributed to a possible minor chest injury. The patient is taking over-the-counter ibuprofen and has been advised to rest.  Problem 2: The patient has continued to experience general malaise and reported feeling exhausted, unable to function effectively, with a recent incident of leaving work due to feeling extremely tired and unwell. The symptoms have been exacerbated by a lack of rest as the patient continued working despite the illness.  REVIEW OF SYSTEMS: Congestion and cough present. Other systems review yields no specific complaints, indicating no signs of fever, chest pain distinct from chronic soreness, palpitations, or  gastrointestinal disturbances currently.   Past Surgical History:  Procedure Laterality Date   CHOLECYSTECTOMY  Outpatient Medications Prior to Visit  Medication Sig Dispense Refill   acetaminophen (TYLENOL) 325 MG tablet Take 650 mg by mouth every 6 (six) hours as needed.     amLODipine (NORVASC) 5 MG tablet Take 1 tablet (5 mg total) by mouth daily. 90 tablet 3   aspirin 325 MG tablet Take 325 mg by mouth daily.     chlorhexidine (PERIDEX) 0.12 % solution Use as directed 15 mLs in the mouth or throat 2 (two) times daily. 1892 mL 5   gabapentin (NEURONTIN) 100 MG capsule Take 1 capsule (100 mg total) by mouth 2 (two) times daily AND 3 capsules (300 mg total) at bedtime. Use as needed for neuropathic pain. 150 capsule 3   hydrochlorothiazide (HYDRODIURIL) 25 MG tablet Take 1 tablet (25 mg total) by mouth daily. 90 tablet 3   Multiple Vitamins-Minerals (MULTIVITAMIN WITH MINERALS) tablet Take 1 tablet by mouth daily.     sertraline (ZOLOFT) 50 MG tablet Take 1.5 tablets (75 mg total) by mouth at bedtime. 135 tablet 3   Vitamin D, Ergocalciferol, (DRISDOL) 1.25 MG (50000 UNIT) CAPS capsule Take 1 capsule (50,000 Units total) by mouth every 7 (seven) days. Take for 12 total doses (weeks) then can transition to 1000 units OTC supplement daily (Patient not taking: Reported on 09/17/2022) 12 capsule 0   No facility-administered medications prior to visit.    Family History  Problem Relation Age of Onset   Diabetes Mother    Hypertension Mother    Heart disease Mother    Arthritis Mother    Obesity Mother    Heart disease Father    Hypertension Father    Stroke Sister    Heart disease Sister    Hypertension Sister    Kidney failure Sister    Alcohol abuse Sister    Drug abuse Sister    Stroke Brother    Heart disease Brother    Hypertension Brother    Alcohol abuse Brother    Depression Brother    Drug abuse Brother    Early death Brother    Obesity Brother    Heart disease  Brother    Hypertension Brother    Alcohol abuse Brother    Drug abuse Brother    Stroke Brother     Social History   Socioeconomic History   Marital status: Divorced    Spouse name: Not on file   Number of children: 3   Years of education: 18   Highest education level: Master's degree (e.g., MA, MS, MEng, MEd, MSW, MBA)  Occupational History   Occupation: Human resources officer  Tobacco Use   Smoking status: Never    Passive exposure: Never   Smokeless tobacco: Never  Vaping Use   Vaping Use: Never used  Substance and Sexual Activity   Alcohol use: Never    Comment: Glass of wine once a year   Drug use: Never   Sexual activity: Not Currently    Birth control/protection: Abstinence  Other Topics Concern   Not on file  Social History Narrative   Right handed   Caffeine 3 cups a day tea and energy drinks   Lives alone one level home       Social Determinants of Health   Financial Resource Strain: Not on file  Food Insecurity: Not on file  Transportation Needs: Not on file  Physical Activity: Not on file  Stress: Not on file  Social Connections: Not on file  Intimate Partner Violence: Not on file  Objective:  Physical Exam: BP 122/78 (BP Location: Left Arm, Patient Position: Sitting, Cuff Size: Large)   Pulse 70   Temp (!) 97.5 F (36.4 C) (Temporal)   Wt 166 lb 6.4 oz (75.5 kg)   SpO2 98%   BMI 29.95 kg/m    Gen: NAD, resting comfortably CV: RRR with no murmurs appreciated Pulm: NWOB, CTAB with no crackles, wheezes, or rhonchi GI: Normal bowel sounds present. Soft, Nontender, Nondistended. MSK: no edema, cyanosis, or clubbing noted Skin: warm, dry Neuro: grossly normal, moves all extremities Psych: Normal affect and thought content        Garner Nash, MD, MS

## 2022-10-17 ENCOUNTER — Encounter: Payer: Self-pay | Admitting: Physician Assistant

## 2022-10-17 ENCOUNTER — Telehealth: Payer: Self-pay | Admitting: *Deleted

## 2022-10-17 ENCOUNTER — Ambulatory Visit (INDEPENDENT_AMBULATORY_CARE_PROVIDER_SITE_OTHER): Payer: Medicare Other | Admitting: Physician Assistant

## 2022-10-17 VITALS — BP 144/86 | HR 85 | Resp 18 | Ht 62.5 in | Wt 165.0 lb

## 2022-10-17 DIAGNOSIS — R413 Other amnesia: Secondary | ICD-10-CM | POA: Diagnosis not present

## 2022-10-17 LAB — CBC WITH DIFFERENTIAL/PLATELET
Basophils Absolute: 0.1 10*3/uL (ref 0.0–0.1)
Basophils Relative: 0.6 % (ref 0.0–3.0)
Eosinophils Absolute: 0.7 10*3/uL (ref 0.0–0.7)
Eosinophils Relative: 5.2 % — ABNORMAL HIGH (ref 0.0–5.0)
HCT: 41 % (ref 36.0–46.0)
Hemoglobin: 13.3 g/dL (ref 12.0–15.0)
Lymphocytes Relative: 25.8 % (ref 12.0–46.0)
Lymphs Abs: 3.7 10*3/uL (ref 0.7–4.0)
MCHC: 32.5 g/dL (ref 30.0–36.0)
MCV: 86.6 fl (ref 78.0–100.0)
Monocytes Absolute: 1.3 10*3/uL — ABNORMAL HIGH (ref 0.1–1.0)
Monocytes Relative: 9.4 % (ref 3.0–12.0)
Neutro Abs: 8.4 10*3/uL — ABNORMAL HIGH (ref 1.4–7.7)
Neutrophils Relative %: 59 % (ref 43.0–77.0)
Platelets: 399 10*3/uL (ref 150.0–400.0)
RBC: 4.73 Mil/uL (ref 3.87–5.11)
RDW: 14.6 % (ref 11.5–15.5)
WBC: 14.3 10*3/uL — ABNORMAL HIGH (ref 4.0–10.5)

## 2022-10-17 LAB — BASIC METABOLIC PANEL
BUN: 19 mg/dL (ref 6–23)
CO2: 27 mEq/L (ref 19–32)
Calcium: 9.7 mg/dL (ref 8.4–10.5)
Chloride: 101 mEq/L (ref 96–112)
Creatinine, Ser: 0.8 mg/dL (ref 0.40–1.20)
GFR: 75.75 mL/min (ref >60.00)
Glucose, Bld: 77 mg/dL (ref 70–99)
Potassium: 4 mEq/L (ref 3.5–5.1)
Sodium: 139 mEq/L (ref 135–145)

## 2022-10-17 NOTE — Telephone Encounter (Signed)
Received call from [facility] asking if BNP from 10/16/22 evening was frozen and still here. Advised Santiago Glad there were no specimens in freezer when I came in this morning. It appears BNP may not have been spun down and frozen for overnight storage.  Please advise if we need to call pt to return for the BNP level?

## 2022-10-17 NOTE — Telephone Encounter (Signed)
Attempted to reach pt and was unable to leave message as voicemail box was full. Sent message via mychart to call and schedule lab appt at her earliest convenience to redraw BNP level.

## 2022-10-17 NOTE — Patient Instructions (Addendum)
It was a pleasure to see you today at our office.   Recommendations:   Continue to monitor mood , recommend psychotherapy  Follow up in 36 months   Whom to call:  Memory  decline, memory medications: Call our office [phone]   For psychiatric meds, mood meds: Please have your primary care physician manage these medications.      For assessment of decision of mental capacity and competency:  Call Dr. Anthoney Harada, geriatric psychiatrist at [phone]  For guidance in geriatric dementia issues please call [facility] [phone]  For guidance regarding [facility] and if placement were needed at the facility, contact Arnell Asal, Social Worker tel: [phone]  If you have any severe symptoms of a stroke, or other severe issues such as confusion,severe chills or fever, etc call 911 or go to the ER as you may need to be evaluated further        RECOMMENDATIONS FOR ALL PATIENTS WITH MEMORY PROBLEMS: 1. Continue to exercise (Recommend 30 minutes of walking everyday, or 3 hours every week) 2. Increase social interactions - continue going to [place] and enjoy social gatherings with friends and family 3. Eat healthy, avoid fried foods and eat more fruits and vegetables 4. Maintain adequate blood pressure, blood sugar, and blood cholesterol level. Reducing the risk of stroke and cardiovascular disease also helps promoting better memory. 5. Avoid stressful situations. Live a simple life and avoid aggravations. Organize your time and prepare for the next day in anticipation. 6. Sleep well, avoid any interruptions of sleep and avoid any distractions in the bedroom that may interfere with adequate sleep quality 7. Avoid sugar, avoid sweets as there is a strong link between excessive sugar intake, diabetes, and cognitive impairment We discussed the Mediterranean diet, which has been shown to help patients reduce the risk of progressive memory disorders  and reduces cardiovascular risk. This includes eating fish, eat fruits and green leafy vegetables, nuts like almonds and hazelnuts, walnuts, and also use olive oil. Avoid fast foods and fried foods as much as possible. Avoid sweets and sugar as sugar use has been linked to worsening of memory function.  There is always a concern of gradual progression of memory problems. If this is the case, then we may need to adjust level of care according to patient needs. Support, both to the patient and caregiver, should then be put into place.      You have been referred for a neuropsychological evaluation (i.e., evaluation of memory and thinking abilities). Please bring someone with you to this appointment if possible, as it is helpful for the doctor to hear from both you and another adult who knows you well. Please bring eyeglasses and hearing aids if you wear them.    The evaluation will take approximately 3 hours and has two parts:   The first part is a clinical interview with the neuropsychologist (Dr. Melvyn Novas or Dr. Nicole Kindred). During the interview, the neuropsychologist will speak with you and the individual you brought to the appointment.    The second part of the evaluation is testing with the doctor's technician Hinton Dyer or Maudie Mercury). During the testing, the technician will ask you to remember different types of material, solve problems, and answer some questionnaires. Your family member will not be present for this portion of the evaluation.   Please note: We must reserve several hours of the neuropsychologist's time and the psychometrician's time for your evaluation appointment. As such, there is a No-Show fee  of $100. If you are unable to attend any of your appointments, please contact our office as soon as possible to reschedule.    FALL PRECAUTIONS: Be cautious when walking. Scan the area for obstacles that may increase the risk of trips and falls. When getting up in the mornings, sit up at the edge of the  bed for a few minutes before getting out of bed. Consider elevating the bed at the head end to avoid drop of blood pressure when getting up. Walk always in a well-lit room (use night lights in the walls). Avoid area rugs or power cords from appliances in the middle of the walkways. Use a walker or a cane if necessary and consider physical therapy for balance exercise. Get your eyesight checked regularly.  FINANCIAL OVERSIGHT: Supervision, especially oversight when making financial decisions or transactions is also recommended.  HOME SAFETY: Consider the safety of the kitchen when operating appliances like stoves, microwave oven, and blender. Consider having supervision and share cooking responsibilities until no longer able to participate in those. Accidents with firearms and other hazards in the house should be identified and addressed as well.   ABILITY TO BE LEFT ALONE: If patient is unable to contact 911 operator, consider using LifeLine, or when the need is there, arrange for someone to stay with patients. Smoking is a fire hazard, consider supervision or cessation. Risk of wandering should be assessed by caregiver and if detected at any point, supervision and safe proof recommendations should be instituted.  MEDICATION SUPERVISION: Inability to self-administer medication needs to be constantly addressed. Implement a mechanism to ensure safe administration of the medications.   DRIVING: Regarding driving, in patients with progressive memory problems, driving will be impaired. We advise to have someone else do the driving if trouble finding directions or if minor accidents are reported. Independent driving assessment is available to determine safety of driving.   If you are interested in the driving assessment, you can contact the following:  [employer] in [place]  [place] [phone] or [phone]    [place] refers to food and lifestyle choices that are based on the traditions of countries located on the [employer]. This way of eating has been shown to help prevent certain conditions and improve outcomes for people who have chronic diseases, like kidney disease and heart disease. What are tips for following this plan? Lifestyle  Cook and eat meals together with your family, when possible. Drink enough fluid to keep your urine clear or pale yellow. Be physically active every day. This includes: Aerobic exercise like running or swimming. Leisure activities like gardening, walking, or housework. Get 7-8 hours of sleep each night. If recommended by your health care provider, drink red wine in moderation. This means 1 glass a day for nonpregnant women and 2 glasses a day for men. A glass of wine equals 5 oz (150 mL). Reading food labels  Check the serving size of packaged foods. For foods such as rice and pasta, the serving size refers to the amount of cooked product, not dry. Check the total fat in packaged foods. Avoid foods that have saturated fat or trans fats. Check the ingredients list for added sugars, such as corn syrup. Shopping  At the grocery store, buy most of your food from the areas near the walls of the store. This includes: Fresh fruits and vegetables (produce). Grains, beans, nuts, and  seeds. Some of these may be available in unpackaged forms or large amounts (in bulk). Fresh seafood. Poultry and eggs. Low-fat dairy products. Buy whole ingredients instead of prepackaged foods. Buy fresh fruits and vegetables in-season from local farmers markets. Buy frozen fruits and vegetables in resealable bags. If you do not have access to quality fresh seafood, buy precooked frozen shrimp or canned fish, such as tuna, salmon, or sardines. Buy small amounts of raw or cooked vegetables, salads, or olives from  the deli or salad bar at your store. Stock your pantry so you always have certain foods on hand, such as olive oil, canned tuna, canned tomatoes, rice, pasta, and beans. Cooking  Cook foods with extra-virgin olive oil instead of using butter or other vegetable oils. Have meat as a side dish, and have vegetables or grains as your main dish. This means having meat in small portions or adding small amounts of meat to foods like pasta or stew. Use beans or vegetables instead of meat in common dishes like chili or lasagna. Experiment with different cooking methods. Try roasting or broiling vegetables instead of steaming or sauteing them. Add frozen vegetables to soups, stews, pasta, or rice. Add nuts or seeds for added healthy fat at each meal. You can add these to yogurt, salads, or vegetable dishes. Marinate fish or vegetables using olive oil, lemon juice, garlic, and fresh herbs. Meal planning  Plan to eat 1 vegetarian meal one day each week. Try to work up to 2 vegetarian meals, if possible. Eat seafood 2 or more times a week. Have healthy snacks readily available, such as: Vegetable sticks with hummus. Greek yogurt. Fruit and nut trail mix. Eat balanced meals throughout the week. This includes: Fruit: 2-3 servings a day Vegetables: 4-5 servings a day Low-fat dairy: 2 servings a day Fish, poultry, or lean meat: 1 serving a day Beans and legumes: 2 or more servings a week Nuts and seeds: 1-2 servings a day Whole grains: 6-8 servings a day Extra-virgin olive oil: 3-4 servings a day Limit red meat and sweets to only a few servings a month What are my food choices? Mediterranean diet Recommended Grains: Whole-grain pasta. Brown rice. Bulgar wheat. Polenta. Couscous. Whole-wheat bread. Orpah Cobb. Vegetables: Artichokes. Beets. Broccoli. Cabbage. Carrots. Eggplant. Green beans. Chard. Kale. Spinach. Onions. Leeks. Peas. Squash. Tomatoes. Peppers. Radishes. Fruits: Apples. Apricots.  Avocado. Berries. Bananas. Cherries. Dates. Figs. Grapes. Lemons. Melon. Oranges. Peaches. Plums. Pomegranate. Meats and other protein foods: Beans. Almonds. Sunflower seeds. Pine nuts. Peanuts. Cod. Salmon. Scallops. Shrimp. Tuna. Tilapia. Clams. Oysters. Eggs. Dairy: Low-fat milk. Cheese. Greek yogurt. Beverages: Water. Red wine. Herbal tea. Fats and oils: Extra virgin olive oil. Avocado oil. Grape seed oil. Sweets and desserts: [place] yogurt with honey. Baked apples. Poached pears. Trail mix. Seasoning and other foods: Basil. Cilantro. Coriander. Cumin. Mint. Parsley. Sage. Rosemary. Tarragon. Garlic. Oregano. Thyme. Pepper. Balsalmic vinegar. Tahini. Hummus. Tomato sauce. Olives. Mushrooms. Limit these Grains: Prepackaged pasta or rice dishes. Prepackaged cereal with added sugar. Vegetables: Deep fried potatoes (french fries). Fruits: Fruit canned in syrup. Meats and other protein foods: Beef. Pork. Lamb. Poultry with skin. Hot dogs. Tomasa Blase. Dairy: Ice cream. Sour cream. Whole milk. Beverages: Juice. Sugar-sweetened soft drinks. Beer. Liquor and spirits. Fats and oils: Butter. [place] oil. Vegetable oil. Beef fat (tallow). Lard. Sweets and desserts: Cookies. Cakes. Pies. Candy. Seasoning and other foods: Mayonnaise. Premade sauces and marinades. The items listed may not be a complete list. Talk with your dietitian about what dietary choices  are right for you. Summary The Mediterranean diet includes both food and lifestyle choices. Eat a variety of fresh fruits and vegetables, beans, nuts, seeds, and whole grains. Limit the amount of red meat and sweets that you eat. Talk with your health care provider about whether it is safe for you to drink red wine in moderation. This means 1 glass a day for nonpregnant women and 2 glasses a day for men. A glass of wine equals 5 oz (150 mL). This information is not intended to replace advice given to you by your health care provider. Make sure you discuss  any questions you have with your health care provider. Document Released: 05/16/2016 Document Revised: 06/18/2016 Document Reviewed: 05/16/2016 Elsevier Interactive Patient Education  2017 [employer].

## 2022-10-17 NOTE — Progress Notes (Signed)
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   Julia Gutierrez is a very pleasant 67 y.o. RH female with a history of hypertension, hyperlipidemia, anxiety, depression, history of migraines chronic vertigo with latest presentation to the ED on 08/05/2022 with a CT of the head without evidence of acute intracranial abnormalities, but showing advanced chronic small vessel ischemic changes within the cerebral white matter, as well as chronic lacunar infarcts within bilateral cerebral hemispheric white matter and left thalamus, and mild generalized cerebral atrophy. MRI has been recommended, but these was never performed because after admission, the patient left AMA; patient also has a history of B12 deficiency, cervical spondylosis, history of Bell's palsy in February 2023, chronic mild leukocytosis, history of Ramsay Hunt syndrome presenting today in follow-up for evaluation of memory loss. Patient is not on antidementia medications.  She has chronic intermittent vertigo, has done vestibular therapy in the past "did not work". She is presenting today in follow-up for evaluation of memory loss. Patient is not on antidementia medications. Her MMSE today is 30/30. No stroke-like symptoms are reported today. Able to perform her ADL'S except if anxiety is present, at which time it hinders her performance. She is to discuss with her PCP regarding CBP/Psychotherapy for anxiety and situational depression.      Recommendations:   Follow up in  6 months. No antidementia medication is indicated at this time Patient to discuss with her PCP about CT and Psychotherapy for anxiety and depression  Recommend good control of cardiovascular risk factors.  Follow with Cardiology  Continue to control mood as per PCP  Agree with referral to ENT for vertigo     Subjective:   This patient is here alone. Previous records as well as any outside records available were reviewed prior to todays  visit.  Patient was last seen on 05/01/22  at which time [place] was 27/30       Any changes in memory since last visit?  "Still cannot multitask or remember things at work" which interfere with her ADLs and at work.   repeats oneself? Denies  Disoriented when walking into a room?  Patient denies  Leaving objects in unusual places?  Patient denies   Wandering behavior?   denies   Any personality changes since last visit?  Not yet addressed with PCP . She has an appt soon for possible referral  to psychiatry  Any worsening depression?: She has a history of depression and reported wishing to discuss with her PCP further, may be open to psychotherapy. "Zoloft is helping" Hallucinations or paranoia?  denies   Seizures?   denies    Any sleep changes?  Denies  vivid dreams, REM behavior or sleepwalking   Sleep apnea?   denies   Any hygiene concerns?   denies   Independent of bathing and dressing?  Endorsed  Does the patient needs help with medications? Patient  is in charge  Who is in charge of the finances?  Patient is in charge     Any changes in appetite?  denies    Patient have trouble swallowing?  denies   Does the patient cook?  Any kitchen accidents such as leaving the stove on?   denies   Any headaches?   Not recently, she report Vision changes? denies Chronic back pain  denies   Ambulates with difficulty?  denies   Recent falls or head injuries?  denies     Unilateral weakness, numbness or tingling?  Denies. She had  an episode of dizziness and recurrent vertigo  She declined further stroke workup and left AMA in 10/203 . She declined referral to vestibular therapy as well.  After meclizine, reglanIVF sx had improved.   Any tremors?  denies   Any anosmia?    denies   Any incontinence of urine?  denies   Any bowel dysfunction?  denies      Patient lives  alone Does the patient drive?endorsed, denies getting lost No longer  taking clonazepam . I was sick with Covid and I wan not taking  it, realized that my vertigo was better, so I stopped it altogether -she says. "When it is stormy, I may get vertigo as well".   MRI brain 05/13/2022 4 mm subacute infarct within the left parietoccipital white matter.  Background advanced cerebral white matter chronic small vessel ischemic disease. To a lesser degree, chronic small vessel ischemic changes are also present within the pons. Chronic lacunar infarct within the left thalamus; mild generalized cerebral atrophy.  CTA brain and neck 06/13/22 1. No intracranial large vessel occlusion. Moderate stenosis in the  right cavernous and bilateral supraclinoid ICA. 2. Approximately 60% stenosis in the proximal left ICA. No additional hemodynamically significant stenosis in the neck. 3. No acute intracranial process. 4. Aortic Atherosclerosis  2 D echo 06/2022 1. Normal LV systolic function with visual EF 60-65%. Left ventricle cavity is normal in size. Normal left ventricular wall thickness. Normal global wall motion. Normal diastolic filling pattern, normal LAP. Calculated EF 65%. 2. Structurally normal trileaflet aortic valve. Mild aortic stenosis. Trace aortic regurgitation. 3. Structurally normal mitral valve. Mild (Grade I) mitral regurgitation. 4. Structurally normal tricuspid valve. Mild tricuspid regurgitation. No evidence of pulmonary hypertension. 5. IVC is dilated with respiratory variation. 6. no prior available for comparison.  Past Medical History:  Diagnosis Date   Acute cystitis without hematuria 08/28/2021   Allergy 2002   Anxiety and depression 04/13/2021   Cervical spondylosis 05/04/2021   Cervicogenic headache 04/13/2021   Chronic fatigue 04/13/2021   Depression    Encounter for annual physical exam 04/13/2021   Encounter to establish care 04/13/2021   Enlarged glands 06/18/2021   Heart murmur    Hypertension    Hypothyroidism    IBS (irritable bowel syndrome)    Increased thirst 04/13/2021   Mouth dryness  04/13/2021   Palpitations 04/13/2021   Stroke ([place]) Oct 2023   Vertigo 04/13/2021     Past Surgical History:  Procedure Laterality Date   CHOLECYSTECTOMY       PREVIOUS MEDICATIONS:   CURRENT MEDICATIONS:  Outpatient Encounter Medications as of 10/17/2022  Medication Sig   acetaminophen (TYLENOL) 325 MG tablet Take 650 mg by mouth every 6 (six) hours as needed.   amLODipine (NORVASC) 5 MG tablet Take 1 tablet (5 mg total) by mouth daily.   aspirin 325 MG tablet Take 325 mg by mouth daily.   chlorhexidine (PERIDEX) 0.12 % solution Use as directed 15 mLs in the mouth or throat 2 (two) times daily.   fluticasone (FLONASE) 50 MCG/ACT nasal spray Place 2 sprays into both nostrils daily.   gabapentin (NEURONTIN) 100 MG capsule Take 1 capsule (100 mg total) by mouth 2 (two) times daily AND 3 capsules (300 mg total) at bedtime. Use as needed for neuropathic pain.   hydrochlorothiazide (HYDRODIURIL) 25 MG tablet Take 1 tablet (25 mg total) by mouth daily.   Multiple Vitamins-Minerals (MULTIVITAMIN WITH MINERALS) tablet Take 1 tablet by mouth daily.   sertraline (ZOLOFT)  50 MG tablet Take 1.5 tablets (75 mg total) by mouth at bedtime.   Vitamin D, Ergocalciferol, (DRISDOL) 1.25 MG (50000 UNIT) CAPS capsule Take 1 capsule (50,000 Units total) by mouth every 7 (seven) days. Take for 12 total doses (weeks) then can transition to 1000 units OTC supplement daily (Patient not taking: Reported on 09/17/2022)   No facility-administered encounter medications on file as of 10/17/2022.     Objective:     PHYSICAL EXAMINATION:    VITALS:   Vitals:   10/17/22 1501  BP: (!) 144/86  Pulse: 85  Resp: 18  SpO2: 95%  Weight: 165 lb (74.8 kg)  Height: 5' 2.5" (1.588 m)    GEN:  The patient appears stated age and is in NAD. HEENT:  Normocephalic, atraumatic.   Neurological examination:  General: NAD, well-groomed, appears stated age. Orientation: The patient is alert. Oriented to person, place  and date Cranial nerves: There is good facial symmetry.The speech is fluent and clear. No aphasia or dysarthria. Fund of knowledge is appropriate. Recent memory impaired and remote memory is normal.  Attention and concentration are normal.  Able to name objects and repeat phrases.  Hearing is intact to conversational tone.   Delayed recall 3/3 Sensation: Sensation is intact to light touch throughout Motor: Strength is at least antigravity x4. Tremors: none  DTR's 2/4 in UE/LE      05/01/2022   10:00 AM  Montreal Cognitive Assessment   Visuospatial/ Executive (0/5) 4  Naming (0/3) 3  Attention: Read list of digits (0/2) 2  Attention: Read list of letters (0/1) 1  Attention: Serial 7 subtraction starting at 100 (0/3) 3  Language: Repeat phrase (0/2) 1  Language : Fluency (0/1) 0  Abstraction (0/2) 2  Delayed Recall (0/5) 5  Orientation (0/6) 6  Total 27  Adjusted Score (based on education) 27       10/17/2022    6:00 PM  MMSE - Mini Mental State Exam  Orientation to time 5  Orientation to Place 5  Registration 3  Attention/ Calculation 5  Recall 3  Language- name 2 objects 2  Language- repeat 1  Language- follow 3 step command 3  Language- read & follow direction 1  Write a sentence 1  Copy design 1  Total score 30       Movement examination: Tone: There is normal tone in the UE/LE Abnormal movements:  no tremor.  No myoclonus.  No asterixis.   Coordination:  There is no decremation with RAM's. Normal finger to nose  Gait and Station: The patient has no difficulty arising out of a deep-seated chair without the use of the hands. The patient's stride length is good.  Gait is cautious and narrow.   Thank you for allowing [place] the opportunity to participate in the care of this nice patient. Please do not hesitate to contact us for any questions or concerns.   Total time spent on today's visit was 30 minutes dedicated to this patient today, preparing to see patient, examining  the patient, ordering tests and/or medications and counseling the patient, documenting clinical information in the EHR or other health record, independently interpreting results and communicating results to the patient/family, discussing treatment and goals, answering patient's questions and coordinating care.  Cc:  Garnette Gunner, MD  Marlowe Kays 10/17/2022 7:20 PM

## 2022-10-20 DIAGNOSIS — Z8616 Personal history of COVID-19: Secondary | ICD-10-CM | POA: Insufficient documentation

## 2022-10-20 DIAGNOSIS — U099 Post covid-19 condition, unspecified: Secondary | ICD-10-CM | POA: Insufficient documentation

## 2022-10-20 NOTE — Assessment & Plan Note (Signed)
The patient continues to report symptoms consistent with post-acute sequelae of SARS-CoV-2 infection (PASC), including cough and breathing discomfort. The reported sensation during inspiration could be related to residual lung irritation or viral-mediated sensory impairment.  Plan:  Continue symptomatic treatment with ibuprofen and encourage complete rest to facilitate recovery. Prescribe fluticasone (FLONASE) for congestion symptoms to be taken as directed. Perform a complete blood count (CBC) and Basic Metabolic Panel (BMP) to check for abnormalities that may contribute to fatigue and malaise. Obtain a chest X-ray to rule out pneumonia. Provide a work note for rest as needed and recommend follow-up if symptoms persist or worsen beyond a week. Monitor for signs of improvement or deterioration, with instructions to seek immediate care should symptoms escalate.

## 2022-10-21 ENCOUNTER — Other Ambulatory Visit: Payer: Self-pay

## 2022-10-21 LAB — BRAIN NATRIURETIC PEPTIDE: Pro B Natriuretic peptide (BNP): 73 pg/mL (ref 0.0–100.0)

## 2022-10-21 NOTE — Telephone Encounter (Signed)
BNP was located in the freezer, the main lab has gotten the lab today their is no need for pt to return to have this lab recollected.

## 2022-10-28 ENCOUNTER — Ambulatory Visit (INDEPENDENT_AMBULATORY_CARE_PROVIDER_SITE_OTHER)
Admission: RE | Admit: 2022-10-28 | Discharge: 2022-10-28 | Disposition: A | Discharge status: Home / Self Care | Payer: Medicare Other | Source: Ambulatory Visit | Attending: Family Medicine | Admitting: Family Medicine

## 2022-10-28 DIAGNOSIS — U099 Post covid-19 condition, unspecified: Secondary | ICD-10-CM | POA: Diagnosis not present

## 2022-10-29 ENCOUNTER — Encounter: Payer: Self-pay | Admitting: Family Medicine

## 2022-10-29 ENCOUNTER — Ambulatory Visit (INDEPENDENT_AMBULATORY_CARE_PROVIDER_SITE_OTHER): Payer: Medicare Other | Admitting: Family Medicine

## 2022-10-29 VITALS — BP 136/82 | HR 69 | Temp 97.7°F | Wt 168.0 lb

## 2022-10-29 DIAGNOSIS — R1011 Right upper quadrant pain: Secondary | ICD-10-CM

## 2022-10-29 DIAGNOSIS — I700 Atherosclerosis of aorta: Secondary | ICD-10-CM

## 2022-10-29 DIAGNOSIS — R42 Dizziness and giddiness: Secondary | ICD-10-CM

## 2022-10-29 DIAGNOSIS — F419 Anxiety disorder, unspecified: Secondary | ICD-10-CM

## 2022-10-29 DIAGNOSIS — L989 Disorder of the skin and subcutaneous tissue, unspecified: Secondary | ICD-10-CM | POA: Diagnosis not present

## 2022-10-29 DIAGNOSIS — F32A Depression, unspecified: Secondary | ICD-10-CM

## 2022-10-29 HISTORY — DX: Disorder of the skin and subcutaneous tissue, unspecified: L98.9

## 2022-10-29 HISTORY — DX: Atherosclerosis of aorta: I70.0

## 2022-10-29 NOTE — Progress Notes (Signed)
At  Assessment/Plan:   Problem List Items Addressed This Visit       Cardiovascular and Mediastinum   Atherosclerosis of aorta [facility])     Musculoskeletal and Integument   Skin lesion    The patient presents with a changing skin lesion on the left abdomen, noted to be scaly with variations in pigment and black spots appearing recently. A family history of melanoma and other skin cancers is reported.  It is recommended that the patient is referred to dermatology for evaluation of the lesion owing to the concerning features such as change in color and new onset of black spots which could signify dysplastic or malignant changes.      Relevant Orders   Ambulatory referral to Dermatology     Other   Anxiety and depression - Primary    Anxiety and depressive symptoms are reported and acknowledged by the patient as requiring attention. The neurology department previously recommended therapy, however, initiation has not occurred. Due to the patient's expressed need for new coping mechanisms and a referral for behavioral health counseling has been put in place      Relevant Orders   Ambulatory referral to Behavioral Health   Colicky RUQ abdominal pain    The patient is experiencing right upper abdominal pain, characterized as colicky, tender to palpation, and occasionally accompanied by nausea without vomiting. Reports indicate that an ultrasound done in May 2023 was normal.  Further evaluation is required, and therefore, a referral to gastroenterology is suggested       Relevant Orders   Ambulatory referral to Gastroenterology   Vertigo    The patient's primary complaint is ongoing vertigo, described as a significant hindrance, slightly responsive to vestibular therapy, and possibly exacerbated by changes in barometric pressure.  Differential diagnosis:  Benign Paroxysmal Positional Vertigo (BPPV): A common cause of vertigo, resulting from displaced otoliths within the inner  ear. Meniere's Disease: An inner ear disorder that can cause episodes of vertigo, along with tinnitus and hearing loss. Vestibular Migraine: Migraine-associated vertigo that can occur with or without the headache phase. Vestibular Neuritis/Labyrinthitis: Inflammation of the inner ear or the nerves connecting the inner ear to the brain, causing sudden, intense vertigo.  Complexity necessitates an ENT referral, 1 is already in place.  Information is provided patient follow-up      Relevant Orders   Ambulatory referral to ENT    There are no discontinued medications.    Subjective:  HPI: Encounter date: 10/29/2022  Shantele Reller is a 67 y.o. female who has Encounter for annual physical exam; Bilateral dry eyes; Anxiety and depression; Vision changes; Migraine with status migrainosus, not intractable; Tooth infection; Ramsay Hunt auricular syndrome; Benign paroxysmal vertigo of both ears; Chronic dryness of both eyes; Dry mouth; [place] (HCC); Rib pain on right side; B12 deficiency; Colicky RUQ abdominal pain; Adjustment reaction with anxiety and depression; Memory impairment; Swallowing impairment; Urinary incontinence, nocturnal enuresis; Neurological signs; Vertigo; Primary hypertension; Post-COVID-19 syndrome; Atherosclerosis of aorta (HCC); and Skin lesion on their problem list..   She  has a past medical history of Acute cystitis without hematuria (08/28/2021), Allergy (2002), Anxiety and depression (04/13/2021), Cervical spondylosis (05/04/2021), Cervicogenic headache (04/13/2021), Chronic fatigue (04/13/2021), Depression, Encounter for annual physical exam (04/13/2021), Encounter to establish care (04/13/2021), Enlarged glands (06/18/2021), Heart murmur, Hypertension, Hypothyroidism, IBS (irritable bowel syndrome), Increased thirst (04/13/2021), Mouth dryness (04/13/2021), Palpitations (04/13/2021), Stroke [facility]) (Oct 2023), and Vertigo (04/13/2021)..   CHIEF COMPLAINT: The patient is a  67 year old female presenting with ongoing  vertigo, abdominal tenderness, skin issue on the left abdomen, vertigo, and concerns regarding dementia follow-up.  HISTORY OF PRESENT ILLNESS:  Problem 1: Memory changes. Neurology follow-up for dementia was stable, with considerations of vascular changes or contributions from anxiety and depressive could be contributing.  Problem 2: Vertigo is a significant hindrance for the patient. She has completed vestibular therapy which provides slight, transient relief. There is an upcoming referral to an ENT specialist for further assessment of her vertigo, which she confirms is often exacerbated by barometric pressure changes.  Problem 3: The patient has noted a change in a skin lesion on her left abdomen, described as "a little bit scaly" with darkening spots that have recently become lighter but developed black spots within them, signifying possible changes that warrant dermatological evaluation. The patient reports a family history of melanoma and sebaceous carcinoma.  Problem 4: The patient also experienced right upper abdominal tenderness, which is persistent and occasionally severe. An ultrasound was performed in May 2023 which appeared normal, yet the patient's symptoms are still present and occasionally accompanied by nausea.  Problem 5: The patient has a history of anxiety and depressive symptoms, which appear to have been exacerbated by her physical health concerns. Neurology has suggested therapy which has not been initiated, yet the patient acknowledges the necessity for psychological support.  REVIEW OF SYSTEMS: The patient reports tenderness on her right upper abdominal side and intermittent dizziness. She mentions nausea related to the abdominal pain but denies any other gastrointestinal symptoms such as vomiting or bloating. She also denies chest pain and respiratory symptoms beyond those aforementioned. All other review of systems is negative  as per the information provided.  Past Surgical History:  Procedure Laterality Date   CHOLECYSTECTOMY      Outpatient Medications Prior to Visit  Medication Sig Dispense Refill   acetaminophen (TYLENOL) 325 MG tablet Take 650 mg by mouth every 6 (six) hours as needed.     amLODipine (NORVASC) 5 MG tablet Take 1 tablet (5 mg total) by mouth daily. 90 tablet 3   aspirin 325 MG tablet Take 325 mg by mouth daily.     chlorhexidine (PERIDEX) 0.12 % solution Use as directed 15 mLs in the mouth or throat 2 (two) times daily. 1892 mL 5   fluticasone (FLONASE) 50 MCG/ACT nasal spray Place 2 sprays into both nostrils daily. 16 g 6   gabapentin (NEURONTIN) 100 MG capsule Take 1 capsule (100 mg total) by mouth 2 (two) times daily AND 3 capsules (300 mg total) at bedtime. Use as needed for neuropathic pain. 150 capsule 3   hydrochlorothiazide (HYDRODIURIL) 25 MG tablet Take 1 tablet (25 mg total) by mouth daily. 90 tablet 3   sertraline (ZOLOFT) 50 MG tablet Take 1.5 tablets (75 mg total) by mouth at bedtime. 135 tablet 3   Multiple Vitamins-Minerals (MULTIVITAMIN WITH MINERALS) tablet Take 1 tablet by mouth daily. (Patient not taking: Reported on 10/29/2022)     Vitamin D, Ergocalciferol, (DRISDOL) 1.25 MG (50000 UNIT) CAPS capsule Take 1 capsule (50,000 Units total) by mouth every 7 (seven) days. Take for 12 total doses (weeks) then can transition to 1000 units OTC supplement daily (Patient not taking: Reported on 09/17/2022) 12 capsule 0   No facility-administered medications prior to visit.    Family History  Problem Relation Age of Onset   Diabetes Mother    Hypertension Mother    Heart disease Mother    Arthritis Mother    Obesity Mother  Heart disease Father    Hypertension Father    Stroke Sister    Heart disease Sister    Hypertension Sister    Kidney failure Sister    Alcohol abuse Sister    Drug abuse Sister    Stroke Brother    Heart disease Brother    Hypertension Brother     Alcohol abuse Brother    Depression Brother    Drug abuse Brother    Early death Brother    Obesity Brother    Heart disease Brother    Hypertension Brother    Alcohol abuse Brother    Drug abuse Brother    Stroke Brother     Social History   Socioeconomic History   Marital status: Divorced    Spouse name: Not on file   Number of children: 3   Years of education: 18   Highest education level: Master's degree (e.g., MA, MS, MEng, MEd, MSW, MBA)  Occupational History   Occupation: Holiday representative  Tobacco Use   Smoking status: Never    Passive exposure: Never   Smokeless tobacco: Never  Vaping Use   Vaping Use: Never used  Substance and Sexual Activity   Alcohol use: Never    Comment: Glass of wine once a year   Drug use: Never   Sexual activity: Not Currently    Birth control/protection: Abstinence  Other Topics Concern   Not on file  Social History Narrative   Right handed   Caffeine 3 cups a day tea and energy drinks   Lives alone one level home       Social Determinants of Health   Financial Resource Strain: Not on file  Food Insecurity: Not on file  Transportation Needs: Not on file  Physical Activity: Not on file  Stress: Not on file  Social Connections: Not on file  Intimate Partner Violence: Not on file                                                                                                 Objective:  Physical Exam: BP 136/82 (BP Location: Left Arm, Patient Position: Sitting, Cuff Size: Large)   Pulse 69   Temp 97.7 F (36.5 C) (Temporal)   Wt 168 lb (76.2 kg)   SpO2 99%   BMI 30.24 kg/m    General: No acute distress. Awake and conversant.  Eyes: Normal conjunctiva, anicteric. Round symmetric pupils.  ENT: Hearing grossly intact. No nasal discharge.  Neck: Neck is supple. No masses or thyromegaly.  Respiratory: Respirations are non-labored.  CTAB Skin: Warm. No rashes or ulcers.  Lesion on left lateral abdomen, brown, stuck on  appearance, there is scattered dimpling and dark speckling pattern, mild scaling Psych: Alert and oriented. Cooperative, Appropriate mood and affect, Normal judgment.  CV: No cyanosis or JVD, RRR, MRG ABD: Right upper quadrant mildly tender, remainder of quadrants nontender, no palpable masses or distention MSK: Normal ambulation. No clubbing  Neuro: Sensation and CN II-XII grossly normal.   The patient has had liver function tests, including a Comprehensive Metabolic Panel (CMP) in October of the  prior year, with findings of normal liver function, AST, ALT, alkaline phosphatase, and bilirubin levels.   IMAGING: The patient had an abdominal ultrasound in May 2023, which revealed no abnormalities.     Alesia Banda, MD, MS

## 2022-10-29 NOTE — Assessment & Plan Note (Signed)
The patient is experiencing right upper abdominal pain, characterized as colicky, tender to palpation, and occasionally accompanied by nausea without vomiting. Reports indicate that an ultrasound done in May 2023 was normal.  Further evaluation is required, and therefore, a referral to gastroenterology is suggested

## 2022-10-29 NOTE — Assessment & Plan Note (Signed)
The patient's primary complaint is ongoing vertigo, described as a significant hindrance, slightly responsive to vestibular therapy, and possibly exacerbated by changes in barometric pressure.  Differential diagnosis:  Benign Paroxysmal Positional Vertigo (BPPV): A common cause of vertigo, resulting from displaced otoliths within the inner ear. Meniere's Disease: An inner ear disorder that can cause episodes of vertigo, along with tinnitus and hearing loss. Vestibular Migraine: Migraine-associated vertigo that can occur with or without the headache phase. Vestibular Neuritis/Labyrinthitis: Inflammation of the inner ear or the nerves connecting the inner ear to the brain, causing sudden, intense vertigo.  Complexity necessitates an ENT referral, 1 is already in place.  Information is provided patient follow-up

## 2022-10-29 NOTE — Assessment & Plan Note (Signed)
Anxiety and depressive symptoms are reported and acknowledged by the patient as requiring attention. The neurology department previously recommended therapy, however, initiation has not occurred. Due to the patient's expressed need for new coping mechanisms and a referral for behavioral health counseling has been put in place

## 2022-10-29 NOTE — Assessment & Plan Note (Signed)
The patient presents with a changing skin lesion on the left abdomen, noted to be scaly with variations in pigment and black spots appearing recently. A family history of melanoma and other skin cancers is reported.  It is recommended that the patient is referred to dermatology for evaluation of the lesion owing to the concerning features such as change in color and new onset of black spots which could signify dysplastic or malignant changes.

## 2022-10-29 NOTE — Patient Instructions (Addendum)
For abdominal pain, referring to Gastroenterology. For vertigo, we are following up on referral to Dr. Redmond Baseman. You may also follow-up with the office at the contact information below.  [place]  [address]  [place], [place] [address]  [phone]  For skin lesion on abdomen, we are referring to Dermatology.  For anxious mood, we are referring to [facility].

## 2022-11-20 ENCOUNTER — Other Ambulatory Visit: Payer: Self-pay

## 2022-11-20 DIAGNOSIS — Z1231 Encounter for screening mammogram for malignant neoplasm of breast: Secondary | ICD-10-CM

## 2022-11-26 ENCOUNTER — Telehealth: Payer: Self-pay | Admitting: Family Medicine

## 2022-11-26 NOTE — Telephone Encounter (Signed)
Called patient to schedule Medicare Annual Wellness Visit (AWV). No voicemail available to leave a message.  Last date of AWV: due    awvi 11/07/21 per [facility]   Please schedule an appointment at any time with [facility]  If any questions, please contact me at [phone].  Thank you ,  Barkley Boards AWV direct phone # [phone]

## 2022-11-27 ENCOUNTER — Other Ambulatory Visit: Payer: Self-pay | Admitting: Family Medicine

## 2022-11-27 DIAGNOSIS — Z1231 Encounter for screening mammogram for malignant neoplasm of breast: Secondary | ICD-10-CM

## 2022-12-01 ENCOUNTER — Other Ambulatory Visit (HOSPITAL_BASED_OUTPATIENT_CLINIC_OR_DEPARTMENT_OTHER): Payer: Self-pay | Admitting: Nurse Practitioner

## 2022-12-01 ENCOUNTER — Other Ambulatory Visit (HOSPITAL_BASED_OUTPATIENT_CLINIC_OR_DEPARTMENT_OTHER): Payer: Self-pay

## 2022-12-02 ENCOUNTER — Other Ambulatory Visit: Payer: Self-pay

## 2022-12-02 ENCOUNTER — Other Ambulatory Visit (HOSPITAL_BASED_OUTPATIENT_CLINIC_OR_DEPARTMENT_OTHER): Payer: Self-pay

## 2022-12-02 ENCOUNTER — Inpatient Hospital Stay: Admission: RE | Admit: 2022-12-02 | Payer: Medicare Other | Source: Ambulatory Visit

## 2022-12-02 MED ORDER — AMLODIPINE BESYLATE 5 MG PO TABS
5.0000 mg | ORAL_TABLET | Freq: Every day | ORAL | 0 refills | Status: DC
  Filled 2022-12-02 – 2022-12-10 (×2): qty 90, 90d supply, fill #0

## 2022-12-09 ENCOUNTER — Other Ambulatory Visit (HOSPITAL_BASED_OUTPATIENT_CLINIC_OR_DEPARTMENT_OTHER): Payer: Self-pay

## 2022-12-10 ENCOUNTER — Other Ambulatory Visit (HOSPITAL_BASED_OUTPATIENT_CLINIC_OR_DEPARTMENT_OTHER): Payer: Self-pay

## 2022-12-18 ENCOUNTER — Encounter: Payer: Self-pay | Admitting: Nurse Practitioner

## 2022-12-18 ENCOUNTER — Ambulatory Visit (INDEPENDENT_AMBULATORY_CARE_PROVIDER_SITE_OTHER): Payer: Medicare Other | Admitting: Nurse Practitioner

## 2022-12-18 ENCOUNTER — Other Ambulatory Visit (HOSPITAL_BASED_OUTPATIENT_CLINIC_OR_DEPARTMENT_OTHER): Payer: Self-pay

## 2022-12-18 VITALS — BP 146/98 | HR 68 | Wt 169.0 lb

## 2022-12-18 DIAGNOSIS — M545 Low back pain, unspecified: Secondary | ICD-10-CM

## 2022-12-18 DIAGNOSIS — M3500 Sicca syndrome, unspecified: Secondary | ICD-10-CM

## 2022-12-18 DIAGNOSIS — F4323 Adjustment disorder with mixed anxiety and depressed mood: Secondary | ICD-10-CM

## 2022-12-18 DIAGNOSIS — B0221 Postherpetic geniculate ganglionitis: Secondary | ICD-10-CM

## 2022-12-18 DIAGNOSIS — I10 Essential (primary) hypertension: Secondary | ICD-10-CM

## 2022-12-18 DIAGNOSIS — K219 Gastro-esophageal reflux disease without esophagitis: Secondary | ICD-10-CM | POA: Diagnosis not present

## 2022-12-18 DIAGNOSIS — E538 Deficiency of other specified B group vitamins: Secondary | ICD-10-CM

## 2022-12-18 DIAGNOSIS — I700 Atherosclerosis of aorta: Secondary | ICD-10-CM

## 2022-12-18 DIAGNOSIS — R413 Other amnesia: Secondary | ICD-10-CM

## 2022-12-18 DIAGNOSIS — R5382 Chronic fatigue, unspecified: Secondary | ICD-10-CM

## 2022-12-18 MED ORDER — KETOROLAC TROMETHAMINE 30 MG/ML IJ SOLN
30.0000 mg | Freq: Once | INTRAMUSCULAR | Status: AC
  Administered 2022-12-18: 30 mg via INTRAMUSCULAR

## 2022-12-18 MED ORDER — CYANOCOBALAMIN 1000 MCG/ML IJ SOLN
1000.0000 ug | INTRAMUSCULAR | 3 refills | Status: DC
  Filled 2022-12-18: qty 3, 90d supply, fill #0

## 2022-12-18 MED ORDER — SERTRALINE HCL 50 MG PO TABS
75.0000 mg | ORAL_TABLET | Freq: Every day | ORAL | 3 refills | Status: DC
  Filled 2022-12-18: qty 135, 90d supply, fill #0

## 2022-12-18 MED ORDER — AMLODIPINE BESYLATE 5 MG PO TABS
5.0000 mg | ORAL_TABLET | Freq: Every day | ORAL | 0 refills | Status: DC
  Filled 2022-12-18: qty 90, 90d supply, fill #0

## 2022-12-18 MED ORDER — HYDROCHLOROTHIAZIDE 25 MG PO TABS
25.0000 mg | ORAL_TABLET | Freq: Every day | ORAL | 3 refills | Status: DC
  Filled 2022-12-18 – 2023-01-07 (×2): qty 90, 90d supply, fill #0
  Filled 2023-04-04: qty 90, 90d supply, fill #1
  Filled 2023-07-04: qty 90, 90d supply, fill #2
  Filled 2023-10-01: qty 90, 90d supply, fill #3

## 2022-12-18 NOTE — Patient Instructions (Signed)
I will keep looking to see if I can find any ideas on what else we can do or look at.

## 2022-12-18 NOTE — Progress Notes (Signed)
Julia Keeler, DNP, AGNP-c [place]  [address] [place], [place] [address] [phone]  ESTABLISHED PATIENT- Chronic Health and/or Follow-Up Visit  Blood pressure (!) 146/98, pulse 68, weight 169 lb (76.7 kg).    Julia Gutierrez is a 67 y.o. year old female presenting today for evaluation and management of the following:  [name] presents today to go over multiple health concerns, including increased body aches, fatigue, and cognitive issues.  She reports having discontinued several of her medications to see if this would help her feel better, specifically gabapentin and her benzodiazepine that she was using for chronic vertigo symptoms.  Marles mentions feeling mentally clearer since stopping this medications.  She would like to avoid medications that may cloud her cognition due to baseline changes.  Despite this, she notes an increase in pain following the discontinuation of gabapentin.  In the recounts her second bout of COVID-19 in late December.  She believes this illnesses have lingering effects, contributing to her overall fatigue, which worsens as the day progresses and impacts her daily activities and job performance.  This is led her to switch to a less mentally demanding job role.  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  Unfortunately we have not been able to pinpoint a specific causative factor for her symptoms despite testing.  All ROS negative with exception of what is listed above.   PHYSICAL EXAM Physical Exam Vitals and nursing note reviewed.  Constitutional:      General: She is not in acute distress.    Appearance:  Normal appearance. She is not toxic-appearing.  HENT:     Head: Normocephalic.  Eyes:     Extraocular Movements:     Right eye: Nystagmus present.     Left eye: Nystagmus present.     Pupils: Pupils are equal, round, and reactive to light.  Neck:     Vascular: No carotid bruit.  Cardiovascular:     Rate and Rhythm: Normal rate and regular rhythm.     Pulses: Normal pulses.     Heart sounds: Normal heart sounds.  Pulmonary:     Effort: Pulmonary effort is normal.     Breath sounds: Normal breath sounds. No wheezing.  Abdominal:     General: Bowel sounds are normal. There is no distension.     Palpations: Abdomen is soft.     Tenderness: There is no abdominal tenderness. There is no guarding.  Musculoskeletal:     Cervical back: Normal range of motion.     Right lower leg: No edema.     Left lower leg: No edema.  Lymphadenopathy:     Cervical: No cervical adenopathy.  Skin:    General: Skin is warm and dry.     Capillary Refill: Capillary refill takes less than 2 seconds.  Neurological:     Mental Status: She is alert and oriented to person, place, and time.     Gait: Gait abnormal.  Psychiatric:        Attention and Perception: Attention normal.        Mood and Affect: Mood normal.        Behavior: Behavior normal. Behavior is cooperative.  Cognition and Memory: Memory is impaired.        Judgment: Judgment normal.     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 We have tried numerous therapy's and  medications to help with her symptom management, however, the severe vertigo has not been controlled. She has recently stopped her valium and tells me that she feels better mentally/cognitively and has not noticed a significant difference in her vestibular symptoms. Unfortunately, her vertigo appears to be chronic. I am concerned of underlying causes that could have triggered the initial onset of the condition. She does have several symptoms that appear to be autoimmune related but testing thus far has not been revealing. We will plan to continue with supportive care that includes careful management of walking and moving and medications as needed. I do feel that additional testing may be helpful and will research specific tests that may help to pinpoint any underlying cause.       Relevant Medications   sertraline (ZOLOFT) 50 MG tablet   Other Relevant Orders   Vitamin B12 (Completed)   CBC with Differential/Platelet (Completed)   Comprehensive metabolic panel (Completed)   Iron, TIBC and Ferritin Panel (Completed)   VITAMIN D 25 Hydroxy (Vit-D Deficiency, Fractures) (Completed)   TSH (Completed)   ANA+ENA+DNA/DS+Scl 70+SjoSSA/B (Completed)   [place] (HCC)    Chronic dry eye and dry mouth with no known cause. Offending medications have been stopped with no change. We have tested for ANA, which was negative, however, I do feel that there may be an underlying autoimmune concern that could be contributing. I will plan to review additional labs that may help Julia Gutierrez pinpoint what could be triggering these symptoms.       Relevant Orders   Vitamin B12 (Completed)   CBC with Differential/Platelet (Completed)   Comprehensive metabolic panel (Completed)   Iron, TIBC and Ferritin Panel (Completed)   VITAMIN D 25 Hydroxy (Vit-D Deficiency, Fractures) (Completed)   TSH (Completed)   ANA+ENA+DNA/DS+Scl 70+SjoSSA/B (Completed)   B12 deficiency    Lab monitoring today to determine if replacement therapy is  needed.       Relevant Medications   cyanocobalamin (VITAMIN B12) 1000 MCG/ML injection   Other Relevant Orders   Vitamin B12 (Completed)   CBC with Differential/Platelet (Completed)   Comprehensive metabolic panel (Completed)   Iron, TIBC and Ferritin Panel (Completed)   VITAMIN D 25 Hydroxy (Vit-D Deficiency, Fractures) (Completed)   TSH (Completed)   ANA+ENA+DNA/DS+Scl 70+SjoSSA/B (Completed)   Adjustment reaction with anxiety and depression    Chronic depressive symptoms related to ongoing health issues and changes that have caused significant alteration in her lifestyle and daily activities. She is doing very well at this time managing her symptoms and keeping her mood up. Her new job has her working outside the majority of the time, which has been beneficial to her mental health. She has had improvement of her symptoms since starting medication, therefore, we will plan to continue that at this time. We will continue to monitor her mood closely, there are  no alarm symptoms present today.       Relevant Medications   sertraline (ZOLOFT) 50 MG tablet   Memory changes    Leylany has noted memory changes over the last two years that have led to concerns with cognitive decline. She has been seen by a specialist and underlying mild cognitive decline was noted. At this time, she appears to be stable and actually shows some improvement with the removal of gabapentin and valium, which is promising. She is aware of the changes and the progression and is monitoring closely with her family. At this time there are no alarm symptoms. We will continue to monitor closely. We can consider medications if the symptoms begin to progress.      Relevant Orders   Vitamin B12 (Completed)   CBC with Differential/Platelet (Completed)   Comprehensive metabolic panel (Completed)   Iron, TIBC and Ferritin Panel (Completed)   VITAMIN D 25 Hydroxy (Vit-D Deficiency, Fractures) (Completed)   TSH (Completed)    ANA+ENA+DNA/DS+Scl 70+SjoSSA/B (Completed)   Primary hypertension - Primary    Chynah's blood pressure is elevated in the office today, however, this is believed to be precipitated by pain in her lower back. Recommend continue current medications at this time and monitor blood pressures at home 3-4 days a week when calm and resting to get a better measurement to ensure that it is not staying elevated. Labs are pending today.       Relevant Medications   amLODipine (NORVASC) 5 MG tablet   hydrochlorothiazide (HYDRODIURIL) 25 MG tablet   Other Relevant Orders   Vitamin B12 (Completed)   CBC with Differential/Platelet (Completed)   Comprehensive metabolic panel (Completed)   Iron, TIBC and Ferritin Panel (Completed)   VITAMIN D 25 Hydroxy (Vit-D Deficiency, Fractures) (Completed)   TSH (Completed)   ANA+ENA+DNA/DS+Scl 70+SjoSSA/B (Completed)   Atherosclerosis of aorta (HCC)    Chronic medical concerns.  She is not currently on statin therapy however is taking an aspirin.  We will obtain labs today for further evaluation of lipid levels. Recommend daily exercise and low fat diet to help reduce risks.       Relevant Medications   amLODipine (NORVASC) 5 MG tablet   hydrochlorothiazide (HYDRODIURIL) 25 MG tablet   Other Relevant Orders   Vitamin B12 (Completed)   CBC with Differential/Platelet (Completed)   Comprehensive metabolic panel (Completed)   Iron, TIBC and Ferritin Panel (Completed)   VITAMIN D 25 Hydroxy (Vit-D Deficiency, Fractures) (Completed)   TSH (Completed)   ANA+ENA+DNA/DS+Scl 70+SjoSSA/B (Completed)   Lumbar pain    Chronic low back pain worsening since stopping gabapentin. Fortunately, she has had improvement of her mental symptoms with the removal of this medication. We will plan for toradol injection today to see if this is helpful for pain relief. We can consider imaging or further evaluation if this progresses and the patient wishes to pursue this.       Other Visit  Diagnoses     Gastroesophageal reflux disease, unspecified whether esophagitis present       Relevant Orders   Vitamin B12 (Completed)   CBC with Differential/Platelet (Completed)   Comprehensive metabolic panel (Completed)   Iron, TIBC and Ferritin Panel (Completed)   VITAMIN D 25 Hydroxy (Vit-D Deficiency, Fractures) (Completed)   TSH (Completed)   ANA+ENA+DNA/DS+Scl 70+SjoSSA/B (Completed)       Return in about 6 months (around 06/20/2023) for Med Management.   Julia Keeler, DNP, AGNP-c 12/18/2022  9:22 AM

## 2022-12-19 ENCOUNTER — Other Ambulatory Visit (HOSPITAL_BASED_OUTPATIENT_CLINIC_OR_DEPARTMENT_OTHER): Payer: Self-pay

## 2022-12-19 ENCOUNTER — Telehealth: Payer: Self-pay | Admitting: Nurse Practitioner

## 2022-12-19 NOTE — Telephone Encounter (Signed)
Julia Gutierrez called and asked Julia Gutierrez to give her a call because she forgot to mention something in yesterdays visit. I did let her know it may be Julia Gutierrez calling her but I would send the message back.

## 2022-12-20 ENCOUNTER — Other Ambulatory Visit (HOSPITAL_BASED_OUTPATIENT_CLINIC_OR_DEPARTMENT_OTHER): Payer: Self-pay

## 2022-12-20 LAB — CBC WITH DIFFERENTIAL/PLATELET
Basophils Absolute: 0.2 10*3/uL (ref 0.0–0.2)
Basos: 2 %
EOS (ABSOLUTE): 0.9 10*3/uL — ABNORMAL HIGH (ref 0.0–0.4)
Eos: 8 %
Hematocrit: 43.6 % (ref 34.0–46.6)
Hemoglobin: 14.2 g/dL (ref 11.1–15.9)
Immature Grans (Abs): 0 10*3/uL (ref 0.0–0.1)
Immature Granulocytes: 0 %
Lymphocytes Absolute: 2.9 10*3/uL (ref 0.7–3.1)
Lymphs: 27 %
MCH: 27.9 pg (ref 26.6–33.0)
MCHC: 32.6 g/dL (ref 31.5–35.7)
MCV: 86 fL (ref 79–97)
Monocytes Absolute: 1 10*3/uL — ABNORMAL HIGH (ref 0.1–0.9)
Monocytes: 9 %
Neutrophils Absolute: 5.9 10*3/uL (ref 1.4–7.0)
Neutrophils: 54 %
Platelets: 373 10*3/uL (ref 150–450)
RBC: 5.09 x10E6/uL (ref 3.77–5.28)
RDW: 14.5 % (ref 11.7–15.4)
WBC: 10.9 10*3/uL — ABNORMAL HIGH (ref 3.4–10.8)

## 2022-12-20 LAB — IRON,TIBC AND FERRITIN PANEL
Ferritin: 31 ng/mL (ref 15–150)
Iron Saturation: 18 % (ref 15–55)
Iron: 68 ug/dL (ref 27–139)
Total Iron Binding Capacity: 386 ug/dL (ref 250–450)
UIBC: 318 ug/dL (ref 118–369)

## 2022-12-20 LAB — VITAMIN B12: Vitamin B-12: 524 pg/mL (ref 232–1245)

## 2022-12-20 LAB — COMPREHENSIVE METABOLIC PANEL
ALT: 19 IU/L (ref 0–32)
AST: 19 IU/L (ref 0–40)
Albumin/Globulin Ratio: 1.3 (ref 1.2–2.2)
Albumin: 4.3 g/dL (ref 3.9–4.9)
Alkaline Phosphatase: 92 IU/L (ref 44–121)
BUN/Creatinine Ratio: 27 (ref 12–28)
BUN: 21 mg/dL (ref 8–27)
Bilirubin Total: 0.4 mg/dL (ref 0.0–1.2)
CO2: 23 mmol/L (ref 20–29)
Calcium: 9.9 mg/dL (ref 8.7–10.3)
Chloride: 100 mmol/L (ref 96–106)
Creatinine, Ser: 0.79 mg/dL (ref 0.57–1.00)
Globulin, Total: 3.3 g/dL (ref 1.5–4.5)
Glucose: 98 mg/dL (ref 70–99)
Potassium: 3.3 mmol/L — ABNORMAL LOW (ref 3.5–5.2)
Sodium: 139 mmol/L (ref 134–144)
Total Protein: 7.6 g/dL (ref 6.0–8.5)
eGFR: 81 mL/min/{1.73_m2} (ref >59)

## 2022-12-20 LAB — ANA+ENA+DNA/DS+SCL 70+SJOSSA/B
ANA Titer 1: POSITIVE — AB
ENA RNP Ab: 0.2 AI (ref 0.0–0.9)
ENA SM Ab Ser-aCnc: <0.2 AI (ref 0.0–0.9)
ENA SSA (RO) Ab: <0.2 AI (ref 0.0–0.9)
ENA SSB (LA) Ab: <0.2 AI (ref 0.0–0.9)
Scleroderma (Scl-70) (ENA) Antibody, IgG: <0.2 AI (ref 0.0–0.9)
dsDNA Ab: <1 IU/mL (ref 0–9)

## 2022-12-20 LAB — TSH: TSH: 3.29 u[IU]/mL (ref 0.450–4.500)

## 2022-12-20 LAB — FANA STAINING PATTERNS
Homogeneous Pattern: 1:320 {titer} — ABNORMAL HIGH
Speckled Pattern: 1:640 {titer} — ABNORMAL HIGH

## 2022-12-20 LAB — VITAMIN D 25 HYDROXY (VIT D DEFICIENCY, FRACTURES): Vit D, 25-Hydroxy: 25.7 ng/mL — ABNORMAL LOW (ref 30.0–100.0)

## 2022-12-23 DIAGNOSIS — M545 Low back pain, unspecified: Secondary | ICD-10-CM

## 2022-12-23 HISTORY — DX: Low back pain, unspecified: M54.50

## 2022-12-23 NOTE — Assessment & Plan Note (Signed)
Julia Gutierrez's blood pressure is elevated in the office today, however, this is believed to be precipitated by pain in her lower back. Recommend continue current medications at this time and monitor blood pressures at home 3-4 days a week when calm and resting to get a better measurement to ensure that it is not staying elevated. Labs are pending today.

## 2022-12-23 NOTE — Assessment & Plan Note (Signed)
Chronic dry eye and dry mouth with no known cause. Offending medications have been stopped with no change. We have tested for ANA, which was negative, however, I do feel that there may be an underlying autoimmune concern that could be contributing. I will plan to review additional labs that may help [place] pinpoint what could be triggering these symptoms.

## 2022-12-23 NOTE — Assessment & Plan Note (Signed)
Julia Gutierrez has noted memory changes over the last two years that have led to concerns with cognitive decline. She has been seen by a specialist and underlying mild cognitive decline was noted. At this time, she appears to be stable and actually shows some improvement with the removal of gabapentin and valium, which is promising. She is aware of the changes and the progression and is monitoring closely with her family. At this time there are no alarm symptoms. We will continue to monitor closely. We can consider medications if the symptoms begin to progress.

## 2022-12-23 NOTE — Assessment & Plan Note (Addendum)
Chronic medical concerns.  She is not currently on statin therapy however is taking an aspirin.  We will obtain labs today for further evaluation of lipid levels. Recommend daily exercise and low fat diet to help reduce risks.

## 2022-12-23 NOTE — Assessment & Plan Note (Signed)
Chronic depressive symptoms related to ongoing health issues and changes that have caused significant alteration in her lifestyle and daily activities. She is doing very well at this time managing her symptoms and keeping her mood up. Her new job has her working outside the majority of the time, which has been beneficial to her mental health. She has had improvement of her symptoms since starting medication, therefore, we will plan to continue that at this time. We will continue to monitor her mood closely, there are no alarm symptoms present today.

## 2022-12-23 NOTE — Assessment & Plan Note (Signed)
Chronic low back pain worsening since stopping gabapentin. Fortunately, she has had improvement of her mental symptoms with the removal of this medication. We will plan for toradol injection today to see if this is helpful for pain relief. We can consider imaging or further evaluation if this progresses and the patient wishes to pursue this.

## 2022-12-23 NOTE — Assessment & Plan Note (Signed)
Lab monitoring today to determine if replacement therapy is needed.

## 2022-12-23 NOTE — Assessment & Plan Note (Signed)
Julia Gutierrez sufferers from chronic vertigo related to history of Ramsay Hunt approximately 2 years ago. We have tried numerous therapy's and medications to help with her symptom management, however, the severe vertigo has not been controlled. She has recently stopped her valium and tells me that she feels better mentally/cognitively and has not noticed a significant difference in her vestibular symptoms. Unfortunately, her vertigo appears to be chronic. I am concerned of underlying causes that could have triggered the initial onset of the condition. She does have several symptoms that appear to be autoimmune related but testing thus far has not been revealing. We will plan to continue with supportive care that includes careful management of walking and moving and medications as needed. I do feel that additional testing may be helpful and will research specific tests that may help to pinpoint any underlying cause.

## 2023-01-01 ENCOUNTER — Encounter: Payer: Self-pay | Admitting: Nurse Practitioner

## 2023-01-07 ENCOUNTER — Other Ambulatory Visit (HOSPITAL_BASED_OUTPATIENT_CLINIC_OR_DEPARTMENT_OTHER): Payer: Self-pay

## 2023-01-07 ENCOUNTER — Telehealth: Payer: Medicare Other | Admitting: Physician Assistant

## 2023-01-07 DIAGNOSIS — K047 Periapical abscess without sinus: Secondary | ICD-10-CM

## 2023-01-07 MED ORDER — PENICILLIN V POTASSIUM 500 MG PO TABS
500.0000 mg | ORAL_TABLET | Freq: Three times a day (TID) | ORAL | 0 refills | Status: AC
  Filled 2023-01-07: qty 21, 7d supply, fill #0

## 2023-01-07 NOTE — Progress Notes (Signed)
E-Visit for Dental Pain  We are sorry that you are not feeling well.  Here is how we plan to help!  Based on what you have shared with me in the questionnaire, it sounds like you have a dental infection.  Pen VK 500mg 3 times a day for 7 days  It is imperative that you see a dentist within 10 days of this eVisit to determine the cause of the dental pain and be sure it is adequately treated  A toothache or tooth pain is caused when the nerve in the root of a tooth or surrounding a tooth is irritated. Dental (tooth) infection, decay, injury, or loss of a tooth are the most common causes of dental pain. Pain may also occur after an extraction (tooth is pulled out). Pain sometimes originates from other areas and radiates to the jaw, thus appearing to be tooth pain.Bacteria growing inside your mouth can contribute to gum disease and dental decay, both of which can cause pain. A toothache occurs from inflammation of the central portion of the tooth called pulp. The pulp contains nerve endings that are very sensitive to pain. Inflammation to the pulp or pulpitis may be caused by dental cavities, trauma, and infection.    HOME CARE:   For toothaches: Over-the-counter pain medications such as acetaminophen or ibuprofen may be used. Take these as directed on the package while you arrange for a dental appointment. Avoid very cold or hot foods, because they may make the pain worse. You may get relief from biting on a cotton ball soaked in oil of cloves. You can get oil of cloves at most drug stores.  For jaw pain:  Aspirin may be helpful for problems in the joint of the jaw in adults. If pain happens every time you open your mouth widely, the temporomandibular joint (TMJ) may be the source of the pain. Yawning or taking a large bite of food may worsen the pain. An appointment with your doctor or dentist will help you find the cause.     GET HELP RIGHT AWAY IF:  You have a high fever or chills If you  have had a recent head or face injury and develop headache, light headedness, nausea, vomiting, or other symptoms that concern you after an injury to your face or mouth, you could have a more serious injury in addition to your dental injury. A facial rash associated with a toothache: This condition may improve with medication. Contact your doctor for them to decide what is appropriate. Any jaw pain occurring with chest pain: Although jaw pain is most commonly caused by dental disease, it is sometimes referred pain from other areas. People with heart disease, especially people who have had stents placed, people with diabetes, or those who have had heart surgery may have jaw pain as a symptom of heart attack or angina. If your jaw or tooth pain is associated with lightheadedness, sweating, or shortness of breath, you should see a doctor as soon as possible. Trouble swallowing or excessive pain or bleeding from gums: If you have a history of a weakened immune system, diabetes, or steroid use, you may be more susceptible to infections. Infections can often be more severe and extensive or caused by unusual organisms. Dental and gum infections in people with these conditions may require more aggressive treatment. An abscess may need draining or IV antibiotics, for example.  MAKE SURE YOU   Understand these instructions. Will watch your condition. Will get help right away if   you are not doing well or get worse.  Thank you for choosing an e-visit.  Your e-visit answers were reviewed by a board certified advanced clinical practitioner to complete your personal care plan. Depending upon the condition, your plan could have included both over the counter or prescription medications.  Please review your pharmacy choice. Make sure the pharmacy is open so you can pick up prescription now. If there is a problem, you may contact your provider through MyChart messaging and have the prescription routed to another  pharmacy.  Your safety is important to us. If you have drug allergies check your prescription carefully.   For the next 24 hours you can use MyChart to ask questions about today's visit, request a non-urgent call back, or ask for a work or school excuse. You will get an email in the next two days asking about your experience. I hope that your e-visit has been valuable and will speed your recovery.  I have spent 5 minutes in review of e-visit questionnaire, review and updating patient chart, medical decision making and response to patient.   Yossef Gilkison M Johanny Segers, PA-C  

## 2023-01-08 ENCOUNTER — Other Ambulatory Visit: Payer: Self-pay | Admitting: Nurse Practitioner

## 2023-01-08 ENCOUNTER — Other Ambulatory Visit: Payer: Self-pay

## 2023-01-08 ENCOUNTER — Other Ambulatory Visit (HOSPITAL_BASED_OUTPATIENT_CLINIC_OR_DEPARTMENT_OTHER): Payer: Self-pay

## 2023-01-08 DIAGNOSIS — F4323 Adjustment disorder with mixed anxiety and depressed mood: Secondary | ICD-10-CM

## 2023-01-08 MED ORDER — SERTRALINE HCL 50 MG PO TABS: 75.0000 mg | ORAL_TABLET | Freq: Every day | ORAL | 3 refills | Status: DC

## 2023-01-22 ENCOUNTER — Encounter: Payer: Self-pay | Admitting: Nurse Practitioner

## 2023-01-22 DIAGNOSIS — K047 Periapical abscess without sinus: Secondary | ICD-10-CM

## 2023-01-24 ENCOUNTER — Other Ambulatory Visit (HOSPITAL_COMMUNITY): Payer: Self-pay

## 2023-01-24 MED ORDER — CLINDAMYCIN HCL 300 MG PO CAPS
300.0000 mg | ORAL_CAPSULE | Freq: Four times a day (QID) | ORAL | 0 refills | Status: AC
Start: 2023-01-24 — End: 2023-01-31
  Filled 2023-01-24: qty 28, 7d supply, fill #0

## 2023-02-06 ENCOUNTER — Telehealth: Payer: Self-pay | Admitting: Nurse Practitioner

## 2023-02-06 NOTE — Telephone Encounter (Signed)
Called patient to schedule Medicare Annual Wellness Visit (AWV). Left message for patient to call back and schedule Medicare Annual Wellness Visit (AWV).  Last date of AWV: awvi 11/07/21 per [facility]   Please schedule an appointment at any time with [facility].  If any questions, please contact me at [phone].  Thank you ,  Rudell Cobb AWV direct phone # [phone]

## 2023-02-17 ENCOUNTER — Encounter: Payer: Self-pay | Admitting: Nurse Practitioner

## 2023-02-17 ENCOUNTER — Ambulatory Visit: Payer: 59 | Admitting: Nurse Practitioner

## 2023-02-17 ENCOUNTER — Other Ambulatory Visit (HOSPITAL_COMMUNITY): Payer: Self-pay

## 2023-02-17 VITALS — BP 126/72 | HR 76 | Wt 177.2 lb

## 2023-02-17 DIAGNOSIS — R1033 Periumbilical pain: Secondary | ICD-10-CM | POA: Diagnosis not present

## 2023-02-17 DIAGNOSIS — M3500 Sicca syndrome, unspecified: Secondary | ICD-10-CM

## 2023-02-17 DIAGNOSIS — K047 Periapical abscess without sinus: Secondary | ICD-10-CM | POA: Diagnosis not present

## 2023-02-17 DIAGNOSIS — I10 Essential (primary) hypertension: Secondary | ICD-10-CM

## 2023-02-17 MED ORDER — CLINDAMYCIN HCL 300 MG PO CAPS
300.0000 mg | ORAL_CAPSULE | Freq: Three times a day (TID) | ORAL | 1 refills | Status: DC
Start: 2023-02-17 — End: 2023-04-01
  Filled 2023-02-17: qty 42, 14d supply, fill #0
  Filled 2023-03-06: qty 42, 14d supply, fill #1

## 2023-02-17 MED ORDER — AMLODIPINE BESYLATE 5 MG PO TABS
5.0000 mg | ORAL_TABLET | Freq: Every day | ORAL | 3 refills | Status: DC
Start: 2023-02-17 — End: 2023-10-02
  Filled 2023-02-17 – 2023-03-07 (×3): qty 90, 90d supply, fill #0
  Filled 2023-06-13: qty 90, 90d supply, fill #1
  Filled 2023-09-12 (×2): qty 90, 90d supply, fill #2

## 2023-02-17 NOTE — Progress Notes (Signed)
Tollie Eth, DNP, AGNP-c [facility] [address] [place], [place] [address] [phone]  Subjective:   Julia Gutierrez is a 66 y.o. female presents to day for evaluation of: Xinyu presents today with several health concerns. She reports experiencing significant abdominal pain, describing a tender knot-like sensation when lying down, which she initially suspected might be a hernia due to previous similar experiences. Additionally, she mentions always having mild reflux symptoms, which she attributes to overeating.  She is also dealing with ongoing dental issues, specifically infected wisdom teeth that have recently emerged after years of being misdiagnosed by dentists. She has completed a course of clindamycin for the infection, but it has not fully resolved the issue. Now that her dental insurance is active, she plans to find a new dentist to address this problem.  Tashira expresses frustration with what we have thought to be Sjogren's syndrome, a diagnosis not yet confirmed by tests but strongly suspected due to her symptoms of dry mouth, dry eyes and other related issues. She recounts her history with autoimmune symptoms, including severe dizziness and dry mouth that began in August 2020, which she feels have been inadequately explained by her healthcare providers despite extensive testing.  Furthermore, she mentions her recent discontinuation of antidepressants and her enjoyment of her new job, which allows her to spend a lot of time outdoors. She is concerned about her weight gain and the ongoing management of her blood pressure, for which she takes amlodipine. She is vigilant about her health due to a family history of diabetes and her own pre-diabetic status.  PMH, Medications, and Allergies reviewed and updated in chart as appropriate.   ROS negative except for what is listed in HPI. Objective:  BP 126/72   Pulse 76   Wt 177 lb 3.2 oz (80.4 kg)   BMI 31.89 kg/m   Physical Exam Vitals and nursing note reviewed.  Constitutional:      Appearance: Normal appearance.  HENT:     Head: Normocephalic.     Mouth/Throat:     Mouth: Mucous membranes are dry.     Pharynx: Posterior oropharyngeal erythema present.  Eyes:     Comments: Dry mucous membranes.   Cardiovascular:     Rate and Rhythm: Normal rate and regular rhythm.     Pulses: Normal pulses.     Heart sounds: Normal heart sounds.  Pulmonary:     Effort: Pulmonary effort is normal.     Breath sounds: Normal breath sounds.  Musculoskeletal:        General: Normal range of motion.     Cervical back: Normal range of motion.  Skin:    General: Skin is warm.  Neurological:     General: No focal deficit present.     Mental Status: She is alert and oriented to person, place, and time.  Psychiatric:        Mood and Affect: Mood normal.       Assessment & Plan:   Problem List Items Addressed This Visit     Tooth infection - Primary    Ongoing issues with infected teeth, previously treated with 7-day Clindamycin course which did not fully resolve infection. Strongly suspect this is related to the dry mouth condition and limited saliva.  Plan: - Prescribe Clindamycin for longer course to address persistent infection      Relevant Medications   clindamycin (CLEOCIN) 300 MG capsule   Sicca (HCC)    Strongly suspect Sjogren's Syndrome based on symptoms like  dry mouth and mucous membranes, despite limited laboratory evidence. Symptoms are consistent with this condition without obvious external cause.  Plan: - Focus on symptomatic management including biotin gel for dry mouth and drops for eyes.  - We can consider repeat lab testing in the future.       Primary hypertension     Blood pressure well-controlled. Plan: - Refill Amlodipine prescription for continuous BP management      Relevant Medications   amLODipine (NORVASC) 5 MG tablet   Periumbilical abdominal pain    Reports  recent event with significant abdominal pain and palpable knot. Pain has decreased with no clear bulging noted at this time.  Plan: - Advised to monitor situation and contact clinic if pain recurs/worsens for potential referral for surgical evaluation         Tollie Eth, DNP, AGNP-c 03/07/2023  4:33 PM    History, Medications, Surgery, SDOH, and Family History reviewed and updated as appropriate.

## 2023-02-17 NOTE — Patient Instructions (Signed)
If the abdominal pain comes back, please let us know and we can send a referral for hernia evaluation. I strongly suspect a hernia was the cause.

## 2023-03-06 ENCOUNTER — Other Ambulatory Visit: Payer: Self-pay

## 2023-03-06 ENCOUNTER — Other Ambulatory Visit (HOSPITAL_BASED_OUTPATIENT_CLINIC_OR_DEPARTMENT_OTHER): Payer: Self-pay

## 2023-03-07 ENCOUNTER — Other Ambulatory Visit (HOSPITAL_BASED_OUTPATIENT_CLINIC_OR_DEPARTMENT_OTHER): Payer: Self-pay

## 2023-03-07 ENCOUNTER — Encounter: Payer: Self-pay | Admitting: Nurse Practitioner

## 2023-03-07 ENCOUNTER — Other Ambulatory Visit: Payer: Self-pay

## 2023-03-07 DIAGNOSIS — R1033 Periumbilical pain: Secondary | ICD-10-CM

## 2023-03-07 HISTORY — DX: Periumbilical pain: R10.33

## 2023-03-07 NOTE — Assessment & Plan Note (Signed)
Ongoing issues with infected teeth, previously treated with 7-day Clindamycin course which did not fully resolve infection. Strongly suspect this is related to the dry mouth condition and limited saliva.  Plan: - Prescribe Clindamycin for longer course to address persistent infection

## 2023-03-07 NOTE — Assessment & Plan Note (Signed)
Blood pressure well-controlled. Plan: - Refill Amlodipine prescription for continuous BP management

## 2023-03-07 NOTE — Assessment & Plan Note (Signed)
Strongly suspect Sjogren's Syndrome based on symptoms like dry mouth and mucous membranes, despite limited laboratory evidence. Symptoms are consistent with this condition without obvious external cause.  Plan: - Focus on symptomatic management including biotin gel for dry mouth and drops for eyes.  - We can consider repeat lab testing in the future.

## 2023-03-07 NOTE — Assessment & Plan Note (Signed)
Reports recent event with significant abdominal pain and palpable knot. Pain has decreased with no clear bulging noted at this time.  Plan: - Advised to monitor situation and contact clinic if pain recurs/worsens for potential referral for surgical evaluation

## 2023-03-25 ENCOUNTER — Other Ambulatory Visit (HOSPITAL_BASED_OUTPATIENT_CLINIC_OR_DEPARTMENT_OTHER): Payer: Self-pay

## 2023-03-25 MED ORDER — CLINDAMYCIN HCL 150 MG PO CAPS
150.0000 mg | ORAL_CAPSULE | Freq: Four times a day (QID) | ORAL | 0 refills | Status: DC
  Filled 2023-03-25: qty 28, 7d supply, fill #0

## 2023-04-01 ENCOUNTER — Other Ambulatory Visit (HOSPITAL_BASED_OUTPATIENT_CLINIC_OR_DEPARTMENT_OTHER): Payer: Self-pay

## 2023-04-01 ENCOUNTER — Encounter: Payer: Self-pay | Admitting: Nurse Practitioner

## 2023-04-01 ENCOUNTER — Other Ambulatory Visit (HOSPITAL_COMMUNITY): Payer: Self-pay

## 2023-04-01 DIAGNOSIS — E559 Vitamin D deficiency, unspecified: Secondary | ICD-10-CM

## 2023-04-01 DIAGNOSIS — K047 Periapical abscess without sinus: Secondary | ICD-10-CM

## 2023-04-01 MED ORDER — CLINDAMYCIN HCL 150 MG PO CAPS
150.0000 mg | ORAL_CAPSULE | Freq: Four times a day (QID) | ORAL | 0 refills | Status: DC
Start: 2023-04-01 — End: 2023-04-03
  Filled 2023-04-01 – 2023-04-02 (×2): qty 28, 7d supply, fill #0

## 2023-04-01 MED ORDER — VITAMIN D (ERGOCALCIFEROL) 1.25 MG (50000 UNIT) PO CAPS
50000.0000 [IU] | ORAL_CAPSULE | ORAL | 3 refills | Status: AC
Start: 2023-04-01 — End: ?
  Filled 2023-04-01 – 2023-04-02 (×2): qty 12, 84d supply, fill #0
  Filled 2023-09-12 (×2): qty 12, 84d supply, fill #1
  Filled 2023-12-26: qty 12, 84d supply, fill #2
  Filled 2024-03-23: qty 12, 84d supply, fill #3

## 2023-04-01 NOTE — Telephone Encounter (Signed)
I have sent these in for her 

## 2023-04-02 ENCOUNTER — Other Ambulatory Visit (HOSPITAL_COMMUNITY): Payer: Self-pay

## 2023-04-02 ENCOUNTER — Other Ambulatory Visit (HOSPITAL_BASED_OUTPATIENT_CLINIC_OR_DEPARTMENT_OTHER): Payer: Self-pay

## 2023-04-03 ENCOUNTER — Other Ambulatory Visit (HOSPITAL_COMMUNITY): Payer: Self-pay

## 2023-04-03 ENCOUNTER — Other Ambulatory Visit (HOSPITAL_BASED_OUTPATIENT_CLINIC_OR_DEPARTMENT_OTHER): Payer: Self-pay

## 2023-04-03 MED ORDER — CLINDAMYCIN HCL 150 MG PO CAPS
150.0000 mg | ORAL_CAPSULE | Freq: Four times a day (QID) | ORAL | 0 refills | Status: DC
  Filled 2023-04-04 – 2023-07-04 (×2): qty 28, 7d supply, fill #0

## 2023-04-04 ENCOUNTER — Other Ambulatory Visit: Payer: Self-pay

## 2023-04-04 ENCOUNTER — Other Ambulatory Visit (HOSPITAL_BASED_OUTPATIENT_CLINIC_OR_DEPARTMENT_OTHER): Payer: Self-pay

## 2023-04-04 ENCOUNTER — Other Ambulatory Visit (HOSPITAL_COMMUNITY): Payer: Self-pay

## 2023-04-09 ENCOUNTER — Encounter: Payer: Self-pay | Admitting: Internal Medicine

## 2023-04-16 ENCOUNTER — Encounter: Payer: Self-pay | Admitting: Nurse Practitioner

## 2023-04-16 ENCOUNTER — Ambulatory Visit (INDEPENDENT_AMBULATORY_CARE_PROVIDER_SITE_OTHER): Payer: Medicare Other | Admitting: Nurse Practitioner

## 2023-04-16 VITALS — BP 128/84 | HR 84 | Wt 179.2 lb

## 2023-04-16 DIAGNOSIS — E860 Dehydration: Secondary | ICD-10-CM

## 2023-04-16 DIAGNOSIS — M62838 Other muscle spasm: Secondary | ICD-10-CM

## 2023-04-16 NOTE — Progress Notes (Signed)
[name], DNP, AGNP-c [facility]  [address] [place], [place] [address] [phone]  ESTABLISHED PATIENT- Chronic Health and/or Follow-Up Visit  Blood pressure 128/84, pulse 84, weight 179 lb 3.2 oz (81.3 kg).    Julia Gutierrez is a 67 y.o. year old female presenting today for evaluation and management of chronic conditions.   Julia Gutierrez expresses concerns for worsening fatigue recently. She typically has fatigue at baseline, but it is significantly worse at this time for unknown reasons.  At her last visit labs showed highly positive ANA with homogenous and speckled patterns present.  Her SS-a (Ro) and SS-b (la) were negative, as were her dsDNA and SCL 70 tests.  She has an appt with Dr. Dimple Casey on the 31st of this month.    Today she tells me that she may be dehydrated as she has been working out in the heat daily,which may be worsening her fatigue.  She is still experiencing vertigo symptoms, which are chronic, but not currently worse for her. She has also had a sore throat.   She is also having left shoulder pain that hurts when she lays on it. When she went to readjust herself and noticed the pain with no injury noted. She has not noticed any bruising or swelling.   All ROS negative with exception of what is listed above.   PHYSICAL EXAM Physical Exam Vitals and nursing note reviewed.  Constitutional:      Appearance: Normal appearance.  HENT:     Mouth/Throat:     Mouth: Mucous membranes are dry.     Pharynx: Posterior oropharyngeal erythema present.  Eyes:     General: No scleral icterus.    Conjunctiva/sclera: Conjunctivae normal.  Neck:     Vascular: No carotid bruit.  Cardiovascular:     Rate and Rhythm: Normal rate and regular rhythm.     Pulses: Normal pulses.     Heart sounds: Normal heart sounds.  Pulmonary:     Effort: Pulmonary effort is normal.     Breath sounds: Normal breath sounds.  Musculoskeletal:        General: Tenderness  present.     Cervical back: Normal range of motion and neck supple. No tenderness.     Right lower leg: No edema.     Left lower leg: No edema.     Comments: Limited ROM noted in the left shoulder with strength decreased. No visible signs of injury. Suspect this may be overuse.   Lymphadenopathy:     Cervical: No cervical adenopathy.  Skin:    General: Skin is warm and dry.     Capillary Refill: Capillary refill takes less than 2 seconds.  Neurological:     General: No focal deficit present.     Mental Status: She is alert and oriented to person, place, and time.     Motor: Weakness present.     Gait: Gait normal.  Psychiatric:        Mood and Affect: Mood normal.        Behavior: Behavior normal.     PLAN Problem List Items Addressed This Visit     Dehydration - Primary    Evaluation shows tenting of skin and dryness is present indicating dehydration. Given her current symptoms and these findings, we will infuse 1L NS today to see if this is helpful for management of her symptoms.       Muscle spasms of neck    Shoulder and neck pain and spasms present. I  suspect that the neck pain is subsequently triggering the shoulder. This is likely from overuse and the way she has been laying. Will treat with flexeril and gentle stretches.       Relevant Medications   cyclobenzaprine (FLEXERIL) 5 MG tablet    Return if symptoms worsen or fail to improve.  Time: 40 minutes, >50% spent counseling, care coordination, chart review, and documentation.   [name], DNP, AGNP-c

## 2023-04-17 ENCOUNTER — Other Ambulatory Visit (HOSPITAL_BASED_OUTPATIENT_CLINIC_OR_DEPARTMENT_OTHER): Payer: Self-pay

## 2023-04-17 ENCOUNTER — Other Ambulatory Visit (HOSPITAL_COMMUNITY): Payer: Self-pay

## 2023-04-17 ENCOUNTER — Ambulatory Visit: Payer: Medicare Other | Admitting: Physician Assistant

## 2023-04-17 MED ORDER — CYCLOBENZAPRINE HCL 5 MG PO TABS: 5.0000 mg | ORAL_TABLET | Freq: Every day | ORAL | 1 refills | Status: DC

## 2023-04-17 MED ORDER — CYCLOBENZAPRINE HCL 5 MG PO TABS
5.0000 mg | ORAL_TABLET | Freq: Every day | ORAL | 1 refills | Status: DC
Start: 2023-04-17 — End: 2023-07-03
  Filled 2023-04-17 (×2): qty 30, 15d supply, fill #0

## 2023-04-29 ENCOUNTER — Ambulatory Visit: Payer: Medicare Other | Admitting: Family Medicine

## 2023-05-01 NOTE — Progress Notes (Signed)
Office Visit Note  Patient: Julia Gutierrez             Date of Birth: 21-Jun-1956           MRN: [id number]             PCP: Tollie Eth, NP Referring: Tollie Eth, NP Visit Date: 05/07/2023   Subjective:  Follow-up (Patient states her fatigue is worse and has dry mouth and dry eyes. Patient states there was a change in her blood work, specifically ANA and one other, that she wants looked at. )   History of Present Illness: Julia Gutierrez is a 67 y.o. female here for follow up for persistent dry eyes and mouth here for reevaluation with ongoing symptoms of chronic dry dry mouth photosensitive skin rashes fatigue and persistent vertigo last seen in 2022.  At that time workup was unremarkable for ANA or other serum inflammatory markers.  She noticed worsening of erythematous skin rashes this summer breaking out within an hour or less of sun exposure previously only got this after very prolonged time and at high altitudes.  Dryness is also bothersome using Systane eyedrops for this.  Her most activity limiting symptom is the vertigo cannot really do any aerobic exercises.  She had repeat lab testing in March now showing a ANA positive at 1: 640 titer although reflex ENA panel was negative.  Previous HPI 08/14/2021 Julia Gutierrez is a 67 y.o. female here for follow up for persistent dry eyes and mouth lab tests at initial visit showed low IgG subclass 4 level, negative SSA antibody, and mildly high WBC of 12.7 and platelets 415. Overall results reassuring without inflammatory changes at this time. She continues with persistent vertigo and migrainous type headaches.   Previous HPI 07/24/21 Julia Gutierrez is a 67 y.o. female here for chronic eye and mouth dryness and salivary gland enlargement. Symptoms are persistent since 2020 with fatigue and dry mouth complaints. She developed noticeable problems with dry eyes since about September 2021. She experienced an episode of palsy in January  identified as Ramsay Hunt syndrome with normal CT imaging obtained at that time and no specific underlying cause. This was treated with oral steroids for 3 total short courses so far. 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 She feels chronic fatigue and headaches as well. So far use of eye drops has not dramatically helped symptoms. Eye exam since this started apparently only significant for dry eyes. She has already tried use of oral lozenges or mouth rinse without impressive relief. She denies any finger or toe discoloration. She has no new rashes, hair loss, swollen glands, or ulcers. She has some chronic joint pain with past history of fibromyalgia syndrome but not a primary complaint at this time.   Review of Systems  Constitutional:  Positive for fatigue.  HENT:  Positive for mouth dryness. Negative for mouth sores.   Eyes:  Positive for dryness.  Respiratory:  Negative for shortness of breath.   Cardiovascular:  Positive for palpitations. Negative for chest pain.  Gastrointestinal:  Negative for blood in stool, constipation and diarrhea.  Endocrine: Negative for increased urination.  Genitourinary:  Negative for involuntary urination.  Musculoskeletal:  Positive for joint pain, gait problem, joint pain, myalgias, muscle tenderness and myalgias. Negative for joint swelling, muscle weakness and morning stiffness.  Skin:  Positive for rash and sensitivity to sunlight. Negative for color change and hair loss.  Allergic/Immunologic: Negative for susceptible to infections.  Neurological:  Positive for dizziness and headaches.  Hematological:  Positive for swollen glands.  Psychiatric/Behavioral:  Positive for sleep disturbance. Negative for depressed mood. The patient is not nervous/anxious.     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   Benign paroxysmal vertigo of both ears 05/28/2021   Migraine with status migrainosus, not intractable 05/04/2021   Tooth infection 05/04/2021   Chronic fatigue 04/13/2021   Bilateral dry eyes 04/13/2021   Vision changes 04/13/2021    Past Medical History:  Diagnosis Date   Acute cystitis without hematuria 08/28/2021   Adjustment reaction with anxiety and depression 05/23/2022   Allergy 2002   Anxiety and depression 04/13/2021   Anxiety and depression 04/13/2021   Cervical spondylosis 05/04/2021   Cervicogenic headache 04/13/2021   Chronic fatigue 04/13/2021   Depression    Encounter for annual physical exam 04/13/2021   Encounter to establish care 04/13/2021   Enlarged glands 06/18/2021   Heart murmur    Hypertension    Hypothyroidism    IBS (irritable bowel syndrome)    Increased thirst 04/13/2021   Mouth dryness 04/13/2021   Palpitations 04/13/2021   Stroke (HCC) Oct 2023   Vertigo 04/13/2021    Family History  Problem Relation Age of Onset   Diabetes Mother    Hypertension Mother    Heart disease Mother    Arthritis Mother    Obesity Mother    Heart disease Father    Hypertension Father    Stroke Sister    Heart disease Sister    Hypertension Sister    Kidney failure Sister    Alcohol abuse Sister    Drug abuse Sister    Stroke Brother    Heart disease Brother    Hypertension Brother    Alcohol abuse Brother    Depression Brother    Drug abuse Brother    Early death Brother    Obesity Brother     Heart disease Brother    Hypertension Brother    Alcohol abuse Brother    Drug abuse Brother    Stroke Brother    Past Surgical History:  Procedure Laterality Date   CHOLECYSTECTOMY     Social History   Social History Narrative   Right handed   Caffeine 3 cups a day tea and energy drinks   Lives alone one level home       Immunization History  Administered Date(s) Administered   Moderna Sars-Covid-2 Vaccination 09/02/2020, 09/03/2020     Objective: Vital Signs: BP 129/85 (BP Location: Left Arm, Patient Position: Sitting, Cuff Size: Normal)   Pulse 76   Resp 14   Ht 5\' 2"  (1.575 m)   Wt 179 lb (81.2 kg)   BMI 32.74 kg/m    Physical Exam HENT:     Mouth/Throat:     Mouth: Mucous membranes are dry.     Pharynx: Oropharynx is clear.  Comments: Some flattening of lingual papillae Eyes:     Comments: Conjunctival injection  Cardiovascular:     Rate and Rhythm: Normal rate and regular rhythm.     Heart sounds: Murmur heard.  Pulmonary:     Effort: Pulmonary effort is normal.     Breath sounds: Normal breath sounds.  Musculoskeletal:     Right lower leg: No edema.     Left lower leg: No edema.  Lymphadenopathy:     Cervical: No cervical adenopathy.  Skin:    General: Skin is warm and dry.  Neurological:     Mental Status: She is alert.  Psychiatric:        Mood and Affect: Mood normal.      Musculoskeletal Exam:  Shoulders full ROM no tenderness or swelling Elbows full ROM no tenderness or swelling Wrists full ROM no tenderness or swelling Fingers full ROM no tenderness or swelling Knees full ROM no tenderness or swelling   Investigation: No additional findings.  Imaging: No results found.  Recent Labs: Lab Results  Component Value Date   WBC 10.9 (H) 12/18/2022   HGB 14.2 12/18/2022   PLT 373 12/18/2022   NA 139 12/18/2022   K 3.3 (L) 12/18/2022   CL 100 12/18/2022   CO2 23 12/18/2022   GLUCOSE 98 12/18/2022   BUN 21 12/18/2022    CREATININE 0.79 12/18/2022   BILITOT 0.4 12/18/2022   ALKPHOS 92 12/18/2022   AST 19 12/18/2022   ALT 19 12/18/2022   PROT 7.6 12/18/2022   ALBUMIN 4.3 12/18/2022   CALCIUM 9.9 12/18/2022    Speciality Comments: No specialty comments available.  Procedures:  No procedures performed Allergies: Promethazine   Assessment / Plan:     Visit Diagnoses: Sjogren syndrome with dental involvement (HCC) - Plan: Sedimentation rate, C-reactive protein, C3 and C4, hydroxychloroquine (PLAQUENIL) 200 MG tablet  Clinical picture now more consistent for primary Sjogren's syndrome with highly positive ANA antibody though she still negative for the specific lab markers.  Also with increased and photosensitive type skin rashes in an expected distribution.  Will recheck sed rate CRP and serum complements for other disease activity markers or high risk features.  Will start hydroxychloroquine 200 mg daily for systemic disease see if this helps with skin disease joint pain or fatigue.   Benign paroxysmal vertigo of both ears - may benefit with neurology evaluation, Ambulatory referral to Neurology placed 08/14/2021 - Plan: Ambulatory referral to ENT Ramsay Hunt auricular syndrome - Plan: Ambulatory referral to ENT  Her most limiting symptom is chronic dizziness cannot keep up any substantial exercise and physical activities from this.  Not much progress with her previous neurology evaluation.  She has never seen otolaryngology clinic I recommend getting an evaluation and other treatment recommendation.  Dry mouth  Persistent symptoms no new dental complications lesions.  She is staying hydrated using Biotene supplements.  Previously did not work with trial of pilocarpine.  Discussed possibility of adding alternative selective drug such as ActiV.A.C but just monitor symptoms for now.  Bilateral dry eyes  Chronic dryness currently using Systane eyedrops with partial benefit.  Substantial morning dryness and  crusting.  No abrasions or episodes of acute inflammation.  Orders: Orders Placed This Encounter  Procedures   Sedimentation rate   C-reactive protein   C3 and C4   Ambulatory referral to ENT   Meds ordered this encounter  Medications   hydroxychloroquine (PLAQUENIL) 200 MG tablet    Sig: Take 1 tablet (  200 mg total) by mouth daily.    Dispense:  30 tablet    Refill:  2     Follow-Up Instructions: No follow-ups on file.   Fuller Plan, MD  Note - This record has been created using [employer].  Chart creation errors have been sought, but may not always  have been located. Such creation errors do not reflect on  the standard of medical care.

## 2023-05-07 ENCOUNTER — Encounter: Payer: Self-pay | Admitting: Physician Assistant

## 2023-05-07 ENCOUNTER — Other Ambulatory Visit (HOSPITAL_COMMUNITY): Payer: Self-pay

## 2023-05-07 ENCOUNTER — Ambulatory Visit: Payer: Medicare Other | Attending: Internal Medicine | Admitting: Internal Medicine

## 2023-05-07 ENCOUNTER — Encounter: Payer: Self-pay | Admitting: Internal Medicine

## 2023-05-07 VITALS — BP 129/85 | HR 76 | Resp 14 | Ht 62.0 in | Wt 179.0 lb

## 2023-05-07 DIAGNOSIS — R682 Dry mouth, unspecified: Secondary | ICD-10-CM | POA: Insufficient documentation

## 2023-05-07 DIAGNOSIS — B0221 Postherpetic geniculate ganglionitis: Secondary | ICD-10-CM | POA: Insufficient documentation

## 2023-05-07 DIAGNOSIS — H8113 Benign paroxysmal vertigo, bilateral: Secondary | ICD-10-CM | POA: Insufficient documentation

## 2023-05-07 DIAGNOSIS — M350C Sjogren syndrome with dental involvement: Secondary | ICD-10-CM | POA: Diagnosis present

## 2023-05-07 DIAGNOSIS — H04123 Dry eye syndrome of bilateral lacrimal glands: Secondary | ICD-10-CM | POA: Diagnosis present

## 2023-05-07 MED ORDER — HYDROXYCHLOROQUINE SULFATE 200 MG PO TABS
200.0000 mg | ORAL_TABLET | Freq: Every day | ORAL | 2 refills | Status: DC
Start: 2023-05-07 — End: 2023-10-02
  Filled 2023-05-07 – 2023-07-04 (×2): qty 30, 30d supply, fill #0
  Filled 2023-09-12 (×2): qty 30, 30d supply, fill #1

## 2023-05-07 NOTE — Patient Instructions (Signed)
Hydroxychloroquine Tablets What is this medication? HYDROXYCHLOROQUINE (hye drox ee KLOR oh kwin) treats autoimmune conditions, such as rheumatoid arthritis and lupus. It works by slowing down an overactive immune system. It may also be used to prevent and treat malaria. It works by killing the parasite that causes malaria. It belongs to a group of medications called DMARDs. This medicine may be used for other purposes; ask your health care provider or pharmacist if you have questions. COMMON BRAND NAME(S): Plaquenil, Quineprox, SOVUNA What should I tell my care team before I take this medication? They need to know if you have any of these conditions: Diabetes Eye disease, vision problems Frequently drink alcohol G6PD deficiency Heart disease Irregular heartbeat or rhythm Kidney disease Liver disease Porphyria Psoriasis An unusual or allergic reaction to hydroxychloroquine, other medications, foods, dyes, or preservatives Pregnant or trying to get pregnant Breastfeeding How should I use this medication? Take this medication by mouth with water. Take it as directed on the prescription label. Do not cut, crush, or chew this medication. Swallow the tablets whole. Take it with food. Do not take it more than directed. Take all of this medication unless your care team tells you to stop it early. Keep taking it even if you think you are better. Take products with antacids in them at a different time of day than this medication. Take this medication 4 hours before or 4 hours after antacids. Talk to your care team if you have questions. Talk to your care team about the use of this medication in children. While this medication may be prescribed for selected conditions, precautions do apply. Overdosage: If you think you have taken too much of this medicine contact a poison control center or emergency room at once. NOTE: This medicine is only for you. Do not share this medicine with others. What if I  miss a dose? If you miss a dose, take it as soon as you can. If it is almost time for your next dose, take only that dose. Do not take double or extra doses. What may interact with this medication? Do not take this medication with any of the following: Cisapride Dronedarone Pimozide Thioridazine This medication may also interact with the following: Ampicillin Antacids Cimetidine Cyclosporine Digoxin Kaolin Medications for diabetes, such as insulin, glipizide, glyburide Medications for seizures, such as carbamazepine, phenobarbital, phenytoin Mefloquine Methotrexate Other medications that cause heart rhythm changes Praziquantel This list may not describe all possible interactions. Give your health care provider a list of all the medicines, herbs, non-prescription drugs, or dietary supplements you use. Also tell them if you smoke, drink alcohol, or use illegal drugs. Some items may interact with your medicine. What should I watch for while using this medication? Visit your care team for regular checks on your progress. Tell your care team if your symptoms do not start to get better or if they get worse. You may need blood work done while you are taking this medication. If you take other medications that can affect heart rhythm, you may need more testing. Talk to your care team if you have questions. Your vision may be tested before and during use of this medication. Tell your care team right away if you have any change in your eyesight. This medication may cause serious skin reactions. They can happen weeks to months after starting the medication. Contact your care team right away if you notice fevers or flu-like symptoms with a rash. The rash may be red or purple and then  turn into blisters or peeling of the skin. Or, you might notice a red rash with swelling of the face, lips or lymph nodes in your neck or under your arms. If you or your family notice any changes in your behavior, such as  new or worsening depression, thoughts of harming yourself, anxiety, or other unusual or disturbing thoughts, or memory loss, call your care team right away. What side effects may I notice from receiving this medication? Side effects that you should report to your care team as soon as possible: Allergic reactions--skin rash, itching, hives, swelling of the face, lips, tongue, or throat Aplastic anemia--unusual weakness or fatigue, dizziness, headache, trouble breathing, increased bleeding or bruising Change in vision Heart rhythm changes--fast or irregular heartbeat, dizziness, feeling faint or lightheaded, chest pain, trouble breathing Infection--fever, chills, cough, or sore throat Low blood sugar (hypoglycemia)--tremors or shaking, anxiety, sweating, cold or clammy skin, confusion, dizziness, rapid heartbeat Muscle injury--unusual weakness or fatigue, muscle pain, dark yellow or brown urine, decrease in amount of urine Pain, tingling, or numbness in the hands or feet Rash, fever, and swollen lymph nodes Redness, blistering, peeling, or loosening of the skin, including inside the mouth Thoughts of suicide or self-harm, worsening mood, or feelings of depression Unusual bruising or bleeding Side effects that usually do not require medical attention (report to your care team if they continue or are bothersome): Diarrhea Headache Nausea Stomach pain Vomiting This list may not describe all possible side effects. Call your doctor for medical advice about side effects. You may report side effects to FDA at 1-800-FDA-1088. Where should I keep my medication? Keep out of the reach of children and pets. Store at room temperature up to 30 degrees C (86 degrees F). Protect from light. Get rid of any unused medication after the expiration date. To get rid of medications that are no longer needed or have expired: Take the medication to a medication take-back program. Check with your pharmacy or law  enforcement to find a location. If you cannot return the medication, check the label or package insert to see if the medication should be thrown out in the garbage or flushed down the toilet. If you are not sure, ask your care team. If it is safe to put it in the trash, empty the medication out of the container. Mix the medication with cat litter, dirt, coffee grounds, or other unwanted substance. Seal the mixture in a bag or container. Put it in the trash. NOTE: This sheet is a summary. It may not cover all possible information. If you have questions about this medicine, talk to your doctor, pharmacist, or health care provider.  2024 Elsevier/Gold Standard (2022-04-01 00:00:00)

## 2023-05-08 NOTE — Progress Notes (Signed)
Sedimentation rate is mildly elevated at 38.  This is an indicator for systemic inflammation or immune activity although it is nonspecific.  The CRP and complement test are normal.  I recommend she take the hydroxychloroquine as discussed we can recheck her symptoms and also see if this inflammatory marker improves and our follow-up.

## 2023-05-19 ENCOUNTER — Other Ambulatory Visit (HOSPITAL_COMMUNITY): Payer: Self-pay

## 2023-05-20 DIAGNOSIS — E860 Dehydration: Secondary | ICD-10-CM | POA: Insufficient documentation

## 2023-05-20 DIAGNOSIS — M62838 Other muscle spasm: Secondary | ICD-10-CM | POA: Insufficient documentation

## 2023-05-20 HISTORY — DX: Dehydration: E86.0

## 2023-05-20 HISTORY — DX: Other muscle spasm: M62.838

## 2023-05-20 NOTE — Assessment & Plan Note (Signed)
Shoulder and neck pain and spasms present. I suspect that the neck pain is subsequently triggering the shoulder. This is likely from overuse and the way she has been laying. Will treat with flexeril and gentle stretches.

## 2023-05-20 NOTE — Assessment & Plan Note (Signed)
Evaluation shows tenting of skin and dryness is present indicating dehydration. Given her current symptoms and these findings, we will infuse 1L NS today to see if this is helpful for management of her symptoms.

## 2023-05-21 ENCOUNTER — Ambulatory Visit: Payer: Medicare Other | Admitting: Physician Assistant

## 2023-05-27 ENCOUNTER — Ambulatory Visit: Payer: Medicare Other | Admitting: Physician Assistant

## 2023-05-28 ENCOUNTER — Ambulatory Visit (INDEPENDENT_AMBULATORY_CARE_PROVIDER_SITE_OTHER): Payer: Medicare Other | Admitting: Physician Assistant

## 2023-05-28 ENCOUNTER — Encounter: Payer: Self-pay | Admitting: Physician Assistant

## 2023-05-28 ENCOUNTER — Other Ambulatory Visit (HOSPITAL_BASED_OUTPATIENT_CLINIC_OR_DEPARTMENT_OTHER): Payer: Self-pay

## 2023-05-28 VITALS — BP 145/87 | HR 77 | Resp 20 | Ht 62.0 in | Wt 182.0 lb

## 2023-05-28 DIAGNOSIS — R413 Other amnesia: Secondary | ICD-10-CM

## 2023-05-28 MED ORDER — DONEPEZIL HCL 5 MG PO TABS
5.0000 mg | ORAL_TABLET | Freq: Every day | ORAL | 11 refills | Status: DC
  Filled 2023-05-28: qty 30, 30d supply, fill #0

## 2023-05-28 NOTE — Patient Instructions (Signed)
It was a pleasure to see you today at our office.   Recommendations:  Neuropsych evaluation for clarity of the diagnosis  Start donepezil take 5 mg daily   Continue Aspirin daily  Follow up in 6 months   Whom to call:  Memory  decline, memory medications: Call our office [phone]   For psychiatric meds, mood meds: Please have your primary care physician manage these medications.      For assessment of decision of mental capacity and competency:  Call Dr. Erick Blinks, geriatric psychiatrist at [phone]  For guidance in geriatric dementia issues please call [facility] [phone]   If you have any severe symptoms of a stroke, or other severe issues such as confusion,severe chills or fever, etc call 911 or go to the ER as you may need to be evaluated further        RECOMMENDATIONS FOR ALL PATIENTS WITH MEMORY PROBLEMS: 1. Continue to exercise (Recommend 30 minutes of walking everyday, or 3 hours every week) 2. Increase social interactions - continue going to [place] and enjoy social gatherings with friends and family 3. Eat healthy, avoid fried foods and eat more fruits and vegetables 4. Maintain adequate blood pressure, blood sugar, and blood cholesterol level. Reducing the risk of stroke and cardiovascular disease also helps promoting better memory. 5. Avoid stressful situations. Live a simple life and avoid aggravations. Organize your time and prepare for the next day in anticipation. 6. Sleep well, avoid any interruptions of sleep and avoid any distractions in the bedroom that may interfere with adequate sleep quality 7. Avoid sugar, avoid sweets as there is a strong link between excessive sugar intake, diabetes, and cognitive impairment We discussed the Mediterranean diet, which has been shown to help patients reduce the risk of progressive memory disorders and reduces cardiovascular risk. This includes eating fish, eat fruits and green leafy vegetables,  nuts like almonds and hazelnuts, walnuts, and also use olive oil. Avoid fast foods and fried foods as much as possible. Avoid sweets and sugar as sugar use has been linked to worsening of memory function.  There is always a concern of gradual progression of memory problems. If this is the case, then we may need to adjust level of care according to patient needs. Support, both to the patient and caregiver, should then be put into place.      You have been referred for a neuropsychological evaluation (i.e., evaluation of memory and thinking abilities). Please bring someone with you to this appointment if possible, as it is helpful for the doctor to hear from both you and another adult who knows you well. Please bring eyeglasses and hearing aids if you wear them.    The evaluation will take approximately 3 hours and has two parts:   The first part is a clinical interview with the neuropsychologist (Dr. Milbert Coulter or Dr. Roseanne Reno). During the interview, the neuropsychologist will speak with you and the individual you brought to the appointment.    The second part of the evaluation is testing with the doctor's technician Annabelle Harman or Selena Batten). During the testing, the technician will ask you to remember different types of material, solve problems, and answer some questionnaires. Your family member will not be present for this portion of the evaluation.   Please note: We must reserve several hours of the neuropsychologist's time and the psychometrician's time for your evaluation appointment. As such, there is a No-Show fee of $100. If you are unable to attend any of your  appointments, please contact our office as soon as possible to reschedule.    FALL PRECAUTIONS: Be cautious when walking. Scan the area for obstacles that may increase the risk of trips and falls. When getting up in the mornings, sit up at the edge of the bed for a few minutes before getting out of bed. Consider elevating the bed at the head end to  avoid drop of blood pressure when getting up. Walk always in a well-lit room (use night lights in the walls). Avoid area rugs or power cords from appliances in the middle of the walkways. Use a walker or a cane if necessary and consider physical therapy for balance exercise. Get your eyesight checked regularly.  FINANCIAL OVERSIGHT: Supervision, especially oversight when making financial decisions or transactions is also recommended.  HOME SAFETY: Consider the safety of the kitchen when operating appliances like stoves, microwave oven, and blender. Consider having supervision and share cooking responsibilities until no longer able to participate in those. Accidents with firearms and other hazards in the house should be identified and addressed as well.   ABILITY TO BE LEFT ALONE: If patient is unable to contact 911 operator, consider using [facility], or when the need is there, arrange for someone to stay with patients. Smoking is a fire hazard, consider supervision or cessation. Risk of wandering should be assessed by caregiver and if detected at any point, supervision and safe proof recommendations should be instituted.  MEDICATION SUPERVISION: Inability to self-administer medication needs to be constantly addressed. Implement a mechanism to ensure safe administration of the medications.   DRIVING: Regarding driving, in patients with progressive memory problems, driving will be impaired. We advise to have someone else do the driving if trouble finding directions or if minor accidents are reported. Independent driving assessment is available to determine safety of driving.   If you are interested in the driving assessment, you can contact the following:  [employer] in [place] [phone]  [facility] [phone]  [facility] [phone] or [phone]    Mediterranean Diet A Mediterranean diet refers to food  and lifestyle choices that are based on the traditions of countries located on the [employer] [place]. This way of eating has been shown to help prevent certain conditions and improve outcomes for people who have chronic diseases, like kidney disease and heart disease. What are tips for following this plan? Lifestyle  Cook and eat meals together with your family, when possible. Drink enough fluid to keep your urine clear or pale yellow. Be physically active every day. This includes: Aerobic exercise like running or swimming. Leisure activities like gardening, walking, or housework. Get 7-8 hours of sleep each night. If recommended by your health care provider, drink red wine in moderation. This means 1 glass a day for nonpregnant women and 2 glasses a day for men. A glass of wine equals 5 oz (150 mL). Reading food labels  Check the serving size of packaged foods. For foods such as rice and pasta, the serving size refers to the amount of cooked product, not dry. Check the total fat in packaged foods. Avoid foods that have saturated fat or trans fats. Check the ingredients list for added sugars, such as corn syrup. Shopping  At the grocery store, buy most of your food from the areas near the walls of the store. This includes: Fresh fruits and vegetables (produce). Grains, beans, nuts, and seeds. Some of these may be available in unpackaged forms or  large amounts (in bulk). Fresh seafood. Poultry and eggs. Low-fat dairy products. Buy whole ingredients instead of prepackaged foods. Buy fresh fruits and vegetables in-season from local farmers markets. Buy frozen fruits and vegetables in resealable bags. If you do not have access to quality fresh seafood, buy precooked frozen shrimp or canned fish, such as tuna, salmon, or sardines. Buy small amounts of raw or cooked vegetables, salads, or olives from the deli or salad bar at your store. Stock your pantry so you always have certain foods on hand,  such as olive oil, canned tuna, canned tomatoes, rice, pasta, and beans. Cooking  Cook foods with extra-virgin olive oil instead of using butter or other vegetable oils. Have meat as a side dish, and have vegetables or grains as your main dish. This means having meat in small portions or adding small amounts of meat to foods like pasta or stew. Use beans or vegetables instead of meat in common dishes like chili or lasagna. Experiment with different cooking methods. Try roasting or broiling vegetables instead of steaming or sauteing them. Add frozen vegetables to soups, stews, pasta, or rice. Add nuts or seeds for added healthy fat at each meal. You can add these to yogurt, salads, or vegetable dishes. Marinate fish or vegetables using olive oil, lemon juice, garlic, and fresh herbs. Meal planning  Plan to eat 1 vegetarian meal one day each week. Try to work up to 2 vegetarian meals, if possible. Eat seafood 2 or more times a week. Have healthy snacks readily available, such as: Vegetable sticks with hummus. Greek yogurt. Fruit and nut trail mix. Eat balanced meals throughout the week. This includes: Fruit: 2-3 servings a day Vegetables: 4-5 servings a day Low-fat dairy: 2 servings a day Fish, poultry, or lean meat: 1 serving a day Beans and legumes: 2 or more servings a week Nuts and seeds: 1-2 servings a day Whole grains: 6-8 servings a day Extra-virgin olive oil: 3-4 servings a day Limit red meat and sweets to only a few servings a month What are my food choices? Mediterranean diet Recommended Grains: Whole-grain pasta. Brown rice. Bulgar wheat. Polenta. Couscous. Whole-wheat bread. Orpah Cobb. Vegetables: Artichokes. Beets. Broccoli. Cabbage. Carrots. Eggplant. Green beans. Chard. Kale. Spinach. Onions. Leeks. Peas. Squash. Tomatoes. Peppers. Radishes. Fruits: Apples. Apricots. Avocado. Berries. Bananas. Cherries. Dates. Figs. Grapes. Lemons. Melon. Oranges. Peaches. Plums.  Pomegranate. Meats and other protein foods: Beans. Almonds. Sunflower seeds. Pine nuts. Peanuts. Cod. Salmon. Scallops. Shrimp. Tuna. Tilapia. Clams. Oysters. Eggs. Dairy: Low-fat milk. Cheese. Greek yogurt. Beverages: Water. Red wine. Herbal tea. Fats and oils: Extra virgin olive oil. Avocado oil. Grape seed oil. Sweets and desserts: [place] yogurt with honey. Baked apples. Poached pears. Trail mix. Seasoning and other foods: Basil. Cilantro. Coriander. Cumin. Mint. Parsley. Sage. Rosemary. Tarragon. Garlic. Oregano. Thyme. Pepper. Balsalmic vinegar. Tahini. Hummus. Tomato sauce. Olives. Mushrooms. Limit these Grains: Prepackaged pasta or rice dishes. Prepackaged cereal with added sugar. Vegetables: Deep fried potatoes (french fries). Fruits: Fruit canned in syrup. Meats and other protein foods: Beef. Pork. Lamb. Poultry with skin. Hot dogs. Tomasa Blase. Dairy: Ice cream. Sour cream. Whole milk. Beverages: Juice. Sugar-sweetened soft drinks. Beer. Liquor and spirits. Fats and oils: Butter. Canola oil. Vegetable oil. Beef fat (tallow). Lard. Sweets and desserts: Cookies. Cakes. Pies. Candy. Seasoning and other foods: Mayonnaise. Premade sauces and marinades. The items listed may not be a complete list. Talk with your dietitian about what dietary choices are right for you. Summary The Mediterranean diet includes both food  and lifestyle choices. Eat a variety of fresh fruits and vegetables, beans, nuts, seeds, and whole grains. Limit the amount of red meat and sweets that you eat. Talk with your health care provider about whether it is safe for you to drink red wine in moderation. This means 1 glass a day for nonpregnant women and 2 glasses a day for men. A glass of wine equals 5 oz (150 mL). This information is not intended to replace advice given to you by your health care provider. Make sure you discuss any questions you have with your health care provider. Document Released: 05/16/2016 Document  Revised: 06/18/2016 Document Reviewed: 05/16/2016 Elsevier Interactive Patient Education  2017 [employer].

## 2023-05-28 NOTE — Progress Notes (Signed)
Assessment/Plan:   Memory difficulties, likely of multiple etiologies, including vascular disease.  Julia Gutierrez is a very pleasant 67 y.o. RH female with a history of hypertension, hyperlipidemia, anxiety, depression, history of migraines chronic vertigo, B12 deficiency, cervical spondylosis, history of Bell's palsy in February 2023, chronic mild leukocytosis, history of Ramsay Hunt syndrome, presenting today in follow-up for evaluation of memory loss. Patient is not on antidementia medication. At the time, she tried to minimize the use of agents and testing. In today's visit, we went over again her most recent MRI of the brain, discussing the findings, especially the vascular abnormalities, with significant chronic ischemic changes which may contribute to her memory issues.  Today's MMSE is 30/30, stable from prior.  She is still active, holds a job, and is able to perform her activities of daily living.  She also continues to drive without any significant issues.     Recommendations:   Follow up in 6  months. Referral to neuropsychology, to further evaluate other causes of memory loss, and for clarity of the diagnosis. Start donepezil 5 mg daily, side effects discussed.  Will likely increase it to a more therapeutic dose if this is tolerated, at 10 mg daily. Recommend good control of cardiovascular risk factors, follow-up with cardiology.  Given her significant ischemic vascular disease, she was instructed to be seen at the ER if she develops any strokelike symptoms. Follow-up chronic vertigo with ENT Continue to control mood as per PCP    Subjective:   This patient is here alone. Previous records as well as any outside records available were reviewed prior to todays visit.   Patient was last seen on 10/17/2022, with MMSE of 30/30.     Any changes in memory since last visit? " About the same".  She still reports problems multitasking or remembering things at work, with interferes  with her ADLs and her job.  She just changed jobs and works mostly outdoors, which is very therapeutic for her. Repeats oneself?  Endorsed "but not as much " Disoriented when walking into a room?  Patient denies  Leaving objects in unusual places?  Patient denies   Wandering behavior?   denies   Any personality changes since last visit? Denies.   Any worsening depression?: denies. Hallucinations or paranoia?  denies   Seizures?   denies    Any sleep changes? Sleeps well.  Denies vivid dreams, REM behavior or sleepwalking   Sleep apnea?   Denies.    Any hygiene concerns?   denies   Independent of bathing and dressing?  Endorsed  Does the patient needs help with medications? Patient is in charge   Who is in charge of the finances?  Patient is in charge     Any changes in appetite?  Denies.     Patient have trouble swallowing?  denies   Does the patient cook?  Yes.  Any kitchen accidents such as leaving the stove on?   denies   Any headaches?    denies   Vision changes? denies Chronic pain?  denies   Ambulates with difficulty? Denies.    Recent falls or head injuries? Denies.      Unilateral weakness, numbness or tingling? Denies.   Any tremors?  denies   Any anosmia?    denies   Any incontinence of urine? Urge incontinence.  Any bowel dysfunction?  Denies.      Patient lives alone Does the patient drive?yes, denies any issue s  MRI brain 05/13/2022  4 mm subacute infarct within the left parietoccipital white matter.  Background advanced cerebral white matter chronic small vessel ischemic disease. To a lesser degree, chronic small vessel ischemic changes are also present within the pons. Chronic lacunar infarct within the left thalamus; mild generalized cerebral atrophy.   CTA brain and neck 06/13/22 1. No intracranial large vessel occlusion. 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 2. Approximately 60% stenosis in the proximal left ICA. No additional  hemodynamically significant stenosis in the neck. 3. No acute intracranial process. 4. Aortic Atherosclerosis   Initial visit 05/01/2022 How long did patient have memory difficulties?  "My children think I have dementia".  She denies being forgetful.  During the conversation she changes the subject if she did not understand wheat they are saying.  Sometimes she reports difficulty finding the right word, especially when a lot of stress surrounds her.  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  She denies any dysarthria. Patient lives with:  Patient lives alone repeats oneself?  Disoriented when walking into a room?  Patient denies   Leaving objects in unusual places?  Patient denies   Ambulates  with difficulty?  She has chronic dizziness, and has been evaluated in the past, this is felt to be due to benign positional vertigo, has taken several medications including meclizine, "that helped somewhat, but did not resolve it ".  She has tried vestibular therapy in the past, and uses some of the techniques taught to her.   Recent falls?  Due to these chronic issues, regarding benign positional vertigo, she has a tendency to fall. Any head injuries?  Patient denies   History of seizures?   Patient denies   Wandering behavior?  Patient denies   Patient drives?  No issues.  Any mood changes?  Endorsed.  She has a history of anxiety and depression, and was given several medications for this, which some of them cause dizziness.   Hallucinations?  Patient denies   Paranoia?  Patient denies   Patient reports that he sleeps well without vivid dreams, REM behavior or sleepwalking     History of sleep apnea?  Patient denies   Any hygiene concerns?  Patient denies   Independent of bathing and dressing?  Endorsed  Does the patient needs help with  medications?  Who is in charge of the finances?  Patient is in charge  Any changes in appetite?  Patient denies   Patient have trouble swallowing? Patient denies   Does the patient cook?  Patient denies   Any kitchen accidents such as leaving the stove on? Patient denies   Any headaches?  Endorsed, she has chronic headaches, well controlled with gabapentin and Imitrex. Any vision changes?  She has a history of chronic dry eyes. Any focal numbness or tingling?  About 18 years ago, the patient, while [place] the shopping center, experienced right-sided numbness and weakness, and "could not speak ", these lasted a few seconds, without recurrence until 1.5 yrs ago with similar symptoms, at which time a CT of the head was performed, showing chronic microvascular disease.  No stroke was noted in the CT scan, but she is now on aspirin daily.  At the time, she was diagnosed with Bell's palsy and I discharged with prednisone and valacyclovir.  She denies  any stroke symptoms recurrence since then.   Chronic back pain Patient denies   Unilateral weakness?  Patient denies   Any tremors?  Patient denies   Any history of anosmia?  Patient denies   Any incontinence of urine?  Patient denies   Any bowel dysfunction?   Patient denies   History of heavy alcohol intake?  Patient denies   History of heavy tobacco use?  Patient denies   Family history of dementia?  Patient denies       Recent labs May 2023 TSH 3.19, D 25 30.1, A1c 5.7, LDL 118 otherwise normal, Z61 is pending.   She has worked in Insurance account manager Past Medical History:  Diagnosis Date   Acute cystitis without hematuria 08/28/2021   Adjustment reaction with anxiety and depression 05/23/2022   Allergy 2002   Anxiety and depression 04/13/2021   Anxiety and depression 04/13/2021   Cervical spondylosis 05/04/2021   Cervicogenic headache 04/13/2021   Chronic fatigue 04/13/2021   Depression    Encounter for annual physical exam 04/13/2021   Encounter to  establish care 04/13/2021   Enlarged glands 06/18/2021   Heart murmur    Hypertension    Hypothyroidism    IBS (irritable bowel syndrome)    Increased thirst 04/13/2021   Mouth dryness 04/13/2021   Palpitations 04/13/2021   Stroke (HCC) Oct 2023   Vertigo 04/13/2021     Past Surgical History:  Procedure Laterality Date   CHOLECYSTECTOMY       PREVIOUS MEDICATIONS:   CURRENT MEDICATIONS:  Outpatient Encounter Medications as of 05/28/2023  Medication Sig   acetaminophen (TYLENOL) 325 MG tablet Take 650 mg by mouth every 6 (six) hours as needed.   amLODipine (NORVASC) 5 MG tablet Take 1 tablet (5 mg total) by mouth daily.   aspirin 325 MG tablet Take 325 mg by mouth daily.   cyanocobalamin (VITAMIN B12) 1000 MCG/ML injection 1 mL (1,000 mcg total) every 30 (thirty) days.   cyclobenzaprine (FLEXERIL) 5 MG tablet Take 1-2 tablets (5-10 mg total) by mouth at bedtime for neck spasms   donepezil (ARICEPT) 5 MG tablet Take 1 tablet (5 mg total) by mouth daily.   fluticasone (FLONASE) 50 MCG/ACT nasal spray Place 2 sprays into both nostrils daily.   Omega-3 Fatty Acids (FISH OIL PO) Take by mouth.   POTASSIUM PO Take by mouth.   Vitamin D, Ergocalciferol, (DRISDOL) 1.25 MG (50000 UNIT) CAPS capsule Take 1 capsule (50,000 Units total) by mouth every 7 (seven) days.   chlorhexidine (PERIDEX) 0.12 % solution Use as directed 15 mLs in the mouth or throat 2 (two) times daily.   clindamycin (CLEOCIN) 150 MG capsule Take 1 capsule (150 mg total) by mouth 4 (four) times daily until finished   hydrochlorothiazide (HYDRODIURIL) 25 MG tablet Take 1 tablet (25 mg total) by mouth daily. (Patient not taking: Reported on 05/28/2023)   hydroxychloroquine (PLAQUENIL) 200 MG tablet Take 1 tablet (200 mg total) by mouth daily. (Patient not taking: Reported on 05/28/2023)   No facility-administered encounter medications on file as of 05/28/2023.     Objective:     PHYSICAL EXAMINATION:    VITALS:    Vitals:   05/28/23 1415  BP: (!) 145/87  Pulse: 77  Resp: 20  SpO2: 97%  Weight: 182 lb (82.6 kg)  Height: 5\' 2"  (1.575 m)    GEN:  The patient appears stated age and is in NAD. HEENT:  Normocephalic, atraumatic.   Neurological examination:  General: NAD, well-groomed, appears  stated age. Orientation: The patient is alert. Oriented to person, place and date Cranial nerves: There is good facial symmetry.The speech is fluent and clear. No aphasia or dysarthria. Fund of knowledge is appropriate. Recent memory impaired and remote memory is normal.  Attention and concentration are normal.  Able to name objects and repeat phrases.  Hearing is intact to conversational tone .   Delayed recall 3/3 Sensation: Sensation is intact to light touch throughout Motor: Strength is at least antigravity x4. DTR's 2/4 in UE/LE      05/01/2022   10:00 AM  Montreal Cognitive Assessment   Visuospatial/ Executive (0/5) 4  Naming (0/3) 3  Attention: Read list of digits (0/2) 2  Attention: Read list of letters (0/1) 1  Attention: Serial 7 subtraction starting at 100 (0/3) 3  Language: Repeat phrase (0/2) 1  Language : Fluency (0/1) 0  Abstraction (0/2) 2  Delayed Recall (0/5) 5  Orientation (0/6) 6  Total 27  Adjusted Score (based on education) 27       10/17/2022    6:00 PM  MMSE - Mini Mental State Exam  Orientation to time 5  Orientation to Place 5  Registration 3  Attention/ Calculation 5  Recall 3  Language- name 2 objects 2  Language- repeat 1  Language- follow 3 step command 3  Language- read & follow direction 1  Write a sentence 1  Copy design 1  Total score 30       Movement examination: Tone: There is normal tone in the UE/LE Abnormal movements:  no tremor.  No myoclonus.  No asterixis.   Coordination:  There is no decremation with RAM's. Normal finger to nose  Gait and Station: The patient has no difficulty arising out of a deep-seated chair without the use of the  hands. The patient's stride length is good.  Gait is cautious and narrow.   Thank you for allowing [place] the opportunity to participate in the care of this nice patient. Please do not hesitate to contact us for any questions or concerns.   Total time spent on today's visit was 25 minutes dedicated to this patient today, preparing to see patient, examining the patient, ordering tests and/or medications and counseling the patient, documenting clinical information in the EHR or other health record, independently interpreting results and communicating results to the patient/family, discussing treatment and goals, answering patient's questions and coordinating care.  Cc:  Tollie Eth, NP  Marlowe Kays 05/28/2023 4:36 PM

## 2023-06-11 ENCOUNTER — Other Ambulatory Visit (HOSPITAL_BASED_OUTPATIENT_CLINIC_OR_DEPARTMENT_OTHER): Payer: Self-pay

## 2023-06-23 NOTE — Progress Notes (Deleted)
Office Visit Note  Patient: Julia Gutierrez             Date of Birth: 1955-11-29           MRN: [id number]             PCP: Tollie Eth, NP Referring: Tollie Eth, NP Visit Date: 07/07/2023   Subjective:  No chief complaint on file.   History of Present Illness: Julia Gutierrez is a 67 y.o. female here for follow up for persistent dry eyes and mouth here for reevaluation with ongoing symptoms of chronic dry dry mouth photosensitive skin rashes fatigue and persistent vertigo last seen in 2022.    Previous HPI 05/07/2023 Julia Gutierrez is a 67 y.o. female here for follow up for persistent dry eyes and mouth here for reevaluation with ongoing symptoms of chronic dry dry mouth photosensitive skin rashes fatigue and persistent vertigo last seen in 2022.  At that time workup was unremarkable for ANA or other serum inflammatory markers.  She noticed worsening of erythematous skin rashes this summer breaking out within an hour or less of sun exposure previously only got this after very prolonged time and at high altitudes.  Dryness is also bothersome using Systane eyedrops for this.  Her most activity limiting symptom is the vertigo cannot really do any aerobic exercises.  She had repeat lab testing in March now showing a ANA positive at 1: 640 titer although reflex ENA panel was negative.   Previous HPI 08/14/2021 Julia Gutierrez is a 67 y.o. female here for follow up for persistent dry eyes and mouth lab tests at initial visit showed low IgG subclass 4 level, negative SSA antibody, and mildly high WBC of 12.7 and platelets 415. Overall results reassuring without inflammatory changes at this time. She continues with persistent vertigo and migrainous type headaches.   Previous HPI 07/24/21 Julia Gutierrez is a 67 y.o. female here for chronic eye and mouth dryness and salivary gland enlargement. Symptoms are persistent since 2020 with fatigue and dry mouth complaints. She developed noticeable  problems with dry eyes since about September 2021. She experienced an episode of palsy in January identified as Ramsay Hunt syndrome with normal CT imaging obtained at that time and no specific underlying cause. This was treated with oral steroids for 3 total short courses so far. 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 She feels chronic fatigue and headaches as well. So far use of eye drops has not dramatically helped symptoms. Eye exam since this started apparently only significant for dry eyes. She has already tried use of oral lozenges or mouth rinse without impressive relief. She denies any finger or toe discoloration. She has no new rashes, hair loss, swollen glands, or ulcers. She has some chronic joint pain with past history of fibromyalgia syndrome but not a primary complaint at this time.   No Rheumatology ROS completed.   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/20/2022   Primary hypertension 09/17/2022   Vertigo 08/05/2022   Neurological signs 06/13/2022   Memory changes 05/26/2022   B12 deficiency 02/24/2022   Colicky RUQ abdominal pain 02/24/2022   Rib pain on right side 12/04/2021  Sjogren syndrome with dental involvement (HCC) 08/28/2021   Dry mouth 06/18/2021   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   Benign paroxysmal vertigo of both ears 05/28/2021   Migraine with status migrainosus, not intractable 05/04/2021   Tooth infection 05/04/2021   Chronic fatigue 04/13/2021   Bilateral dry eyes 04/13/2021   Vision changes 04/13/2021    Past Medical History:  Diagnosis Date   Acute cystitis without hematuria 08/28/2021   Adjustment reaction with  anxiety and depression 05/23/2022   Allergy 2002   Anxiety and depression 04/13/2021   Anxiety and depression 04/13/2021   Cervical spondylosis 05/04/2021   Cervicogenic headache 04/13/2021   Chronic fatigue 04/13/2021   Depression    Encounter for annual physical exam 04/13/2021   Encounter to establish care 04/13/2021   Enlarged glands 06/18/2021   Heart murmur    Hypertension    Hypothyroidism    IBS (irritable bowel syndrome)    Increased thirst 04/13/2021   Mouth dryness 04/13/2021   Palpitations 04/13/2021   Stroke (HCC) Oct 2023   Vertigo 04/13/2021    Family History  Problem Relation Age of Onset   Diabetes Mother    Hypertension Mother    Heart disease Mother    Arthritis Mother    Obesity Mother    Heart disease Father    Hypertension Father    Stroke Sister    Heart disease Sister    Hypertension Sister    Kidney failure Sister    Alcohol abuse Sister    Drug abuse Sister    Stroke Brother    Heart disease Brother    Hypertension Brother    Alcohol abuse Brother    Depression Brother    Drug abuse Brother    Early death Brother    Obesity Brother    Heart disease Brother    Hypertension Brother    Alcohol abuse Brother    Drug abuse Brother    Stroke Brother    Past Surgical History:  Procedure Laterality Date   CHOLECYSTECTOMY     Social History   Social History Narrative   Right handed   Caffeine 3 cups a day tea and energy drinks   Lives alone one level home   An apartment with her dog   Immunization History  Administered Date(s) Administered   [employer] Vaccination 09/02/2020, 09/03/2020     Objective: Vital Signs: There were no vitals taken for this visit.   Physical Exam   Musculoskeletal Exam: ***  CDAI Exam: CDAI Score: -- Patient Global: --; Provider Global: -- Swollen: --; Tender: -- Joint Exam 07/07/2023   No joint exam has been documented for this visit   There is currently no information documented on  the homunculus. Go to the Rheumatology activity and complete the homunculus joint exam.  Investigation: No additional findings.  Imaging: No results found.  Recent Labs: Lab Results  Component Value Date   WBC 10.9 (H) 12/18/2022   HGB 14.2 12/18/2022   PLT 373 12/18/2022   NA 139 12/18/2022   K 3.3 (L) 12/18/2022   CL 100 12/18/2022   CO2 23 12/18/2022   GLUCOSE 98 12/18/2022   BUN 21 12/18/2022   CREATININE 0.79 12/18/2022   BILITOT 0.4 12/18/2022   ALKPHOS 92 12/18/2022   AST 19 12/18/2022   ALT 19 12/18/2022   PROT 7.6 12/18/2022   ALBUMIN 4.3 12/18/2022   CALCIUM 9.9 12/18/2022    Speciality Comments: No specialty comments available.  Procedures:  No  procedures performed Allergies: Promethazine   Assessment / Plan:     Visit Diagnoses: No diagnosis found.  ***  Orders: No orders of the defined types were placed in this encounter.  No orders of the defined types were placed in this encounter.    Follow-Up Instructions: No follow-ups on file.   Metta Clines, RT  Note - This record has been created using [employer].  Chart creation errors have been sought, but may not always  have been located. Such creation errors do not reflect on  the standard of medical care.

## 2023-07-03 ENCOUNTER — Ambulatory Visit (INDEPENDENT_AMBULATORY_CARE_PROVIDER_SITE_OTHER): Payer: Medicare Other | Admitting: Nurse Practitioner

## 2023-07-03 ENCOUNTER — Other Ambulatory Visit (HOSPITAL_BASED_OUTPATIENT_CLINIC_OR_DEPARTMENT_OTHER): Payer: Self-pay

## 2023-07-03 ENCOUNTER — Encounter: Payer: Self-pay | Admitting: Nurse Practitioner

## 2023-07-03 VITALS — BP 130/82 | HR 75 | Wt 184.8 lb

## 2023-07-03 DIAGNOSIS — F900 Attention-deficit hyperactivity disorder, predominantly inattentive type: Secondary | ICD-10-CM

## 2023-07-03 DIAGNOSIS — F5101 Primary insomnia: Secondary | ICD-10-CM

## 2023-07-03 DIAGNOSIS — M350C Sjogren syndrome with dental involvement: Secondary | ICD-10-CM | POA: Diagnosis not present

## 2023-07-03 DIAGNOSIS — R413 Other amnesia: Secondary | ICD-10-CM | POA: Diagnosis not present

## 2023-07-03 DIAGNOSIS — B0221 Postherpetic geniculate ganglionitis: Secondary | ICD-10-CM

## 2023-07-03 HISTORY — DX: Primary insomnia: F51.01

## 2023-07-03 HISTORY — DX: Attention-deficit hyperactivity disorder, predominantly inattentive type: F90.0

## 2023-07-03 MED ORDER — METHYLPHENIDATE HCL 10 MG PO TABS
10.0000 mg | ORAL_TABLET | Freq: Two times a day (BID) | ORAL | 0 refills | Status: DC
  Filled 2023-07-03: qty 60, 30d supply, fill #0

## 2023-07-03 NOTE — Patient Instructions (Addendum)
Hold off of the aricept right now. Let's try the methylphenidate 10mg  in the day. You can play with the dose up to 20mg  twice a day. Please let me know what dose works.  Start the Gabapentin at bedtime and lets see if the better sleep helps with your memory.

## 2023-07-03 NOTE — Progress Notes (Signed)
Julia Clamp, DNP, AGNP-c [facility]  [address] [place], [place] [address] [phone]  ESTABLISHED PATIENT- Chronic Health and/or Follow-Up Visit  Blood pressure 130/82, pulse 75, weight 184 lb 12.8 oz (83.8 kg).    Julia Gutierrez is a 67 y.o. year old female presenting today for evaluation and management of chronic conditions.   Julia Gutierrez presents today to discuss new medications prescribed by her specialists and attention/focus concerns. She has been seen by Rheumatology and Neurology, with medications prescribed, but she has not started any due to uncertainty about her diagnosis and concerns about potential side effects.  She reports a long-standing issue with dehydration, the cause of which remains unknown. She is working to increase her water intake.   She has seen rheumatology about her autoimmune concerns, which are very similar to Sjogren's. She expresses a willingness to try the prescribed Plaquenil for her autoimmune condition but has not yet started this medication. She would like to know if the medication will have a benefit.   She reports a potential decline in memory but expresses concern about the proposed treatment with Aricept. She is scheduled for neuropsychology evaluation in February. She expresses a preference for a more definitive diagnosis before starting any medication.  She also reports chronic sleep issues, with a history of poor sleep habits. She occasionally takes gabapentin when feeling particularly run down or in anticipation of a storm, which exacerbates her pain. She tells me that quite often she does not get a full night rest.   She also has a history of ADHD, which she manages without medication. She describes a need for structure and routine and struggles with focus and productivity when this is disrupted. She expresses a willingness to try medication for ADHD, with the hope that it might also improve her memory issues.  She has seen  ENT for her chronic dizziness, diagnosed as vestibular nerve damage. She has been referred to physical therapy for this issue. She is hopeful that this therapy will be different than previous.   All ROS negative with exception of what is listed above.   PHYSICAL EXAM Physical Exam Vitals and nursing note reviewed.  Constitutional:      General: She is not in acute distress.    Appearance: Normal appearance. She is not ill-appearing.  HENT:     Head: Normocephalic.  Pulmonary:     Effort: Pulmonary effort is normal.  Musculoskeletal:        General: Normal range of motion.     Right lower leg: No edema.     Left lower leg: No edema.  Neurological:     General: No focal deficit present.     Mental Status: She is alert and oriented to person, place, and time.  Psychiatric:        Mood and Affect: Mood normal.        Behavior: Behavior normal.        Thought Content: Thought content normal.        Judgment: Judgment normal.     PLAN Problem List Items Addressed This Visit     Ramsay Hunt auricular syndrome    Patient has a history of vestibular nerve damage and is scheduled for physical therapy. -Continue with scheduled physical therapy, monitor for improvement in symptoms.      Relevant Medications   methylphenidate (RITALIN) 10 MG tablet   Sjogren syndrome with dental involvement (HCC)    . Discussed potential benefits of Plaquenil (Hydroxychloroquine) for symptom management. -Start Plaquenil (  Hydroxychloroquine), monitor for side effects.      Attention deficit hyperactivity disorder (ADHD), predominantly inattentive type - Primary    Patient has a history of ADHD and is currently experiencing difficulty with focus and memory. Discussed potential benefits of Methylphenidate. -Start Methylphenidate 10mg , adjust dose as needed based on patient's response.       Relevant Medications   methylphenidate (RITALIN) 10 MG tablet   Primary insomnia    Patient experiences  increased pain during weather changes, currently managed with Gabapentin as needed. -Continue Gabapentin as needed for pain, consider taking regularly at bedtime to improve sleep.      Memory changes    Return in about 3 months (around 10/02/2023) for Med Management 30.    Julia Clamp, DNP, AGNP-c

## 2023-07-04 ENCOUNTER — Other Ambulatory Visit (HOSPITAL_BASED_OUTPATIENT_CLINIC_OR_DEPARTMENT_OTHER): Payer: Self-pay

## 2023-07-07 ENCOUNTER — Ambulatory Visit: Payer: Medicare Other | Admitting: Internal Medicine

## 2023-07-07 ENCOUNTER — Other Ambulatory Visit (HOSPITAL_BASED_OUTPATIENT_CLINIC_OR_DEPARTMENT_OTHER): Payer: Self-pay

## 2023-07-07 DIAGNOSIS — H04123 Dry eye syndrome of bilateral lacrimal glands: Secondary | ICD-10-CM

## 2023-07-07 DIAGNOSIS — Z79899 Other long term (current) drug therapy: Secondary | ICD-10-CM

## 2023-07-07 DIAGNOSIS — H8113 Benign paroxysmal vertigo, bilateral: Secondary | ICD-10-CM

## 2023-07-07 DIAGNOSIS — B0221 Postherpetic geniculate ganglionitis: Secondary | ICD-10-CM

## 2023-07-07 DIAGNOSIS — M350C Sjogren syndrome with dental involvement: Secondary | ICD-10-CM

## 2023-07-07 DIAGNOSIS — R682 Dry mouth, unspecified: Secondary | ICD-10-CM

## 2023-07-07 NOTE — Assessment & Plan Note (Signed)
Patient experiences increased pain during weather changes, currently managed with Gabapentin as needed. -Continue Gabapentin as needed for pain, consider taking regularly at bedtime to improve sleep.

## 2023-07-07 NOTE — Assessment & Plan Note (Signed)
.   Discussed potential benefits of Plaquenil (Hydroxychloroquine) for symptom management. -Start Plaquenil (Hydroxychloroquine), monitor for side effects.

## 2023-07-07 NOTE — Assessment & Plan Note (Signed)
Patient has a history of ADHD and is currently experiencing difficulty with focus and memory. Discussed potential benefits of Methylphenidate. -Start Methylphenidate 10mg , adjust dose as needed based on patient's response.

## 2023-07-07 NOTE — Assessment & Plan Note (Signed)
Patient has a history of vestibular nerve damage and is scheduled for physical therapy. -Continue with scheduled physical therapy, monitor for improvement in symptoms.

## 2023-07-09 ENCOUNTER — Other Ambulatory Visit (HOSPITAL_BASED_OUTPATIENT_CLINIC_OR_DEPARTMENT_OTHER): Payer: Self-pay

## 2023-07-09 ENCOUNTER — Other Ambulatory Visit: Payer: Self-pay | Admitting: Nurse Practitioner

## 2023-07-10 ENCOUNTER — Encounter: Payer: Self-pay | Admitting: Physical Therapy

## 2023-07-10 ENCOUNTER — Ambulatory Visit: Payer: Medicare Other | Attending: Otolaryngology | Admitting: Physical Therapy

## 2023-07-10 DIAGNOSIS — R42 Dizziness and giddiness: Secondary | ICD-10-CM | POA: Insufficient documentation

## 2023-07-10 DIAGNOSIS — R2681 Unsteadiness on feet: Secondary | ICD-10-CM | POA: Diagnosis present

## 2023-07-10 DIAGNOSIS — R262 Difficulty in walking, not elsewhere classified: Secondary | ICD-10-CM | POA: Diagnosis present

## 2023-07-10 NOTE — Therapy (Signed)
OUTPATIENT PHYSICAL THERAPY VESTIBULAR EVALUATION     Patient Name: Julia Gutierrez MRN: [id number] DOB:July 24, 1956, 67 y.o., female Today's Date: 07/11/2023  END OF SESSION:  PT End of Session - 07/11/23 1148     Visit Number 1    Number of Visits 7    Date for PT Re-Evaluation 08/22/23   extend 1 week due to schedule availability   Authorization Type Medicare    Authorization Time Period 07-10-23 - 09-09-23    PT Start Time 1317    PT Stop Time 1401    PT Time Calculation (min) 44 min    Activity Tolerance Patient tolerated treatment well    Behavior During Therapy [facility] for tasks assessed/performed             Past Medical History:  Diagnosis Date   Acute cystitis without hematuria 08/28/2021   Adjustment reaction with anxiety and depression 05/23/2022   Allergy 2002   Anxiety and depression 04/13/2021   Anxiety and depression 04/13/2021   Cervical spondylosis 05/04/2021   Cervicogenic headache 04/13/2021   Chronic fatigue 04/13/2021   Depression    Encounter for annual physical exam 04/13/2021   Encounter to establish care 04/13/2021   Enlarged glands 06/18/2021   Heart murmur    Hypertension    Hypothyroidism    IBS (irritable bowel syndrome)    Increased thirst 04/13/2021   Mouth dryness 04/13/2021   Palpitations 04/13/2021   Stroke (HCC) Oct 2023   Vertigo 04/13/2021   Past Surgical History:  Procedure Laterality Date   CHOLECYSTECTOMY     Patient Active Problem List   Diagnosis Date Noted   Attention deficit hyperactivity disorder (ADHD), predominantly inattentive type 07/03/2023   Primary insomnia 07/03/2023   Dehydration 05/20/2023   Muscle spasms of neck 05/20/2023   Periumbilical abdominal pain 03/07/2023   Lumbar pain 12/23/2022   Atherosclerosis of aorta (HCC) 10/29/2022   Skin lesion 10/29/2022   Post-COVID-19 syndrome 10/20/2022   Primary hypertension 09/17/2022   Vertigo 08/05/2022   Neurological signs 06/13/2022   Memory changes  05/26/2022   B12 deficiency 02/24/2022   Colicky RUQ abdominal pain 02/24/2022   Rib pain on right side 12/04/2021   Sjogren syndrome with dental involvement (HCC) 08/28/2021   Dry mouth 06/18/2021   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   Benign paroxysmal vertigo of both ears 05/28/2021   Migraine with status migrainosus, not intractable 05/04/2021   Tooth infection 05/04/2021   Chronic fatigue 04/13/2021   Bilateral dry eyes 04/13/2021   Vision changes 04/13/2021    PCP: Tollie Eth, NP REFERRING PROVIDER: Newman Pies, MD  REFERRING DIAG:  Diagnosis  R42 (ICD-10-CM) - Dizziness  (Ramsay Hunt auricular syndrome)   THERAPY DIAG:  Dizziness and giddiness  Unsteadiness on feet  Difficulty in walking, not elsewhere classified  ONSET DATE: Jan. 2022  Rationale for Evaluation and Treatment: Rehabilitation  SUBJECTIVE:   SUBJECTIVE STATEMENT: Pt reports she had Ramsay Hunt syndrome in early 2022 - had PT for this diagnosis for several weeks in late 2022. Pt reports the PT she was seeing left and the PT she was referred to was not familiar with treating this diagnosis.  Pt reports she did not continue to do the exercises given.  Pt works full-time at [employer] - does a lot of walking outside, which she states she likes.  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 2024, memory changes, chronic fatigue,Sjogren syndrome with dental involvement,  h/o BPPV bil. 05/2021  PAIN:  Are you having pain? No  PRECAUTIONS: None  RED FLAGS: None   WEIGHT BEARING RESTRICTIONS: No  FALLS: Has patient fallen in last 6 months? No  LIVING ENVIRONMENT: Lives with: lives alone Lives in: House/apartment Stairs: No Has following equipment at home: None  PLOF: Independent; pt works 6 days/week for 6 or 8 hours /day  PATIENT GOALS: "Get as better as I can"  OBJECTIVE:  Note: Objective measures were completed at  Evaluation unless otherwise noted.  DIAGNOSTIC FINDINGS: N/A  COGNITION: Overall cognitive status:  memory problems per pt report  Cervical ROM:  WNL's  STRENGTH: WNL's  GAIT: Gait pattern: WFL Distance walked: 40' Assistive device utilized: None Level of assistance: Modified independence Comments: slight unsteadiness with amb. With horizontal head turns  FUNCTIONAL TESTS:  MCTSIB: Condition 1: Avg of 3 trials: 30 sec, Condition 2: Avg of 3 trials: 30 sec, Condition 3: Avg of 3 trials: 30 sec, Condition 4: Avg of 3 trials: 30 sec, and Total Score: 120/120 Mild postural sway on condition 4 but no LOB PATIENT SURVEYS:  FOTO not captured by front office - to be completed next visit  VESTIBULAR ASSESSMENT:  GENERAL OBSERVATION: pt is a 67 yr old lady amb. Independently without use of assistive device   SYMPTOM BEHAVIOR:  Subjective history: Pt reports she has mostly imbalance and dysequilibrium - some days she gets very dizzy -  Non-Vestibular symptoms: neck pain, headaches, and pressure & itching  Type of dizziness: Imbalance (Disequilibrium), Spinning/Vertigo, Unsteady with head/body turns, Lightheadedness/Faint, and "Swimmyheaded"  Frequency: occurs daily  Duration: mostly constant   Aggravating factors: Induced by motion: occur when walking, turning body quickly, and turning head quickly, Worse with fatigue, Worse in the dark, and Occurs when standing still   Relieving factors: slow movements and avoid busy/distracting environments  Progression of symptoms: better  OCULOMOTOR EXAM:  Ocular Alignment: normal  Ocular ROM: No Limitations  Spontaneous Nystagmus: absent  Gaze-Induced Nystagmus: absent  Smooth Pursuits:  abnormal with horizontal smooth pursuits  Saccades: intact  Line 8/ line 3 - moderate dizziness  FRENZEL - FIXATION SUPRESSED:  Ocular Alignment: normal  Spontaneous Nystagmus: absent  Gaze-Induced Nystagmus: absent   VESTIBULAR - OCULAR REFLEX:     Dynamic Visual Acuity: Static: Line 8 Dynamic: Line 3 = moderate c/o dizziness reported upon completion of test   POSITIONAL TESTING: Other: N/A  MOTION SENSITIVITY:  Motion Sensitivity Quotient Intensity: 0 = none, 1 = Lightheaded, 2 = Mild, 3 = Moderate, 4 = Severe, 5 = Vomiting  Intensity  1. Sitting to supine   2. Supine to L side   3. Supine to R side   4. Supine to sitting   5. L Hallpike-Dix   6. Up from L    7. R Hallpike-Dix   8. Up from R    9. Sitting, head tipped to L knee   10. Head up from L knee   11. Sitting, head tipped to R knee   12. Head up from R knee   13. Sitting head turns x5 3  14.Sitting head nods x5 2  15. In stance, 180 turn to L    16. In stance, 180 turn to R    FUNCTIONAL GAIT: MCTSIB: Condition 1: Avg of 3 trials: 30 sec, Condition 2: Avg of 3 trials: 30 sec, Condition 3: Avg of 3 trials: 30 sec, Condition 4: Avg of 3 trials: 30 sec, and Total Score: 120/120  VESTIBULAR TREATMENT:                                                                                                   DATE: 07-10-23  Gaze Adaptation:  x1 Viewing Horizontal: Position: 60, Time: 60 secs, Reps: 1, and Comment: reported target moving after approx. 50 secs; target on plain background and x1 Viewing Vertical:  Position: standing, Time: 60 secs, and Reps: 1  PATIENT EDUCATION: Education details: x1 viewing in standing Person educated: Patient Education method: Programmer, multimedia, Demonstration, and Handouts Education comprehension: verbalized understanding and returned demonstration  HOME EXERCISE PROGRAM: x1 viewing Gaze stabilization   Gaze Stabilization: Standing Feet Apart    Feet shoulder width apart, keeping eyes on target on wall _5-6___ feet away, tilt head down 15-30 and move head side to side for _60___ seconds. Repeat while moving head up and down for __60__ seconds. Do _3-5___ sessions per day. Repeat using target on pattern background.  GOALS: Goals  reviewed with patient? Yes  SHORT TERM GOALS: same as LTG's as ELOS = 4 weeks   LONG TERM GOALS: Target date: 08-22-23  Improve DVA to </= 3 line difference to demo improvement in VOR function for improved gaze stabilization. Baseline: Line 8/ Line 3 (5 line difference) Goal status: INITIAL  2.  Complete FOTO and set LTG as appropriate. Baseline:  Goal status: INITIAL  3.  Pt will ambulate 37' with intermittent horizontal head turns with c/o dizziness </= 3/10 intensity for improved balance with environmental scanning.  Baseline:  Goal status: INITIAL  4.  Independent in HEP for vestibular/balance exercises and gaze stabilization. Baseline:  Goal status: INITIAL  5.  Increase FGA by at least 4 points to demo improvement in balance. Baseline: TBA Goal status: INITIAL   ASSESSMENT:  CLINICAL IMPRESSION: Patient is a 67 y.o. lady who was seen today for physical therapy evaluation and treatment for dizziness and imbalance due to s/p Ramsay Hunt auricular syndrome. Pt presents with abnormal VOR with function with DVA with a 5 line difference.  Pt has mild unsteadiness with ambulation with horizontal head turns and abnormality noted with horizontal smooth pursuit testing.  Pt was able to stand for 30 secs on all 4 conditions of mCTSIB with mild postural sway noted on condition 4.  Pt will benefit from PT to address balance and vestibular deficits.    OBJECTIVE IMPAIRMENTS: decreased balance, difficulty walking, and dizziness.   ACTIVITY LIMITATIONS: locomotion level  PARTICIPATION LIMITATIONS: shopping and community activity  PERSONAL FACTORS: Time since onset of injury/illness/exacerbation are also affecting patient's functional outcome.   REHAB POTENTIAL: Good  CLINICAL DECISION MAKING: Evolving/moderate complexity  EVALUATION COMPLEXITY: Moderate   PLAN:  PT FREQUENCY: 1x/week  PT DURATION: 6 weeks (anticipate 4 weeks)  PLANNED INTERVENTIONS: Therapeutic exercises,  Therapeutic activity, Neuromuscular re-education, Balance training, Gait training, Patient/Family education, Self Care, and Vestibular training  PLAN FOR NEXT SESSION: issue vestibular HEP - balance on foam; do FOTO; Do FGA; check x1 viewing issued for HEP   Kary Kos, PT 07/11/2023, 12:27 PM

## 2023-07-10 NOTE — Patient Instructions (Signed)
Gaze stabilization   Gaze Stabilization: Standing Feet Apart    Feet shoulder width apart, keeping eyes on target on wall _5-6___ feet away, tilt head down 15-30 and move head side to side for _60___ seconds. Repeat while moving head up and down for __60__ seconds. Do _3-5___ sessions per day. Repeat using target on pattern background.  Copyright  VHI. All rights reserved.

## 2023-07-14 ENCOUNTER — Encounter: Payer: Self-pay | Admitting: Nurse Practitioner

## 2023-07-14 DIAGNOSIS — B0221 Postherpetic geniculate ganglionitis: Secondary | ICD-10-CM

## 2023-07-14 DIAGNOSIS — H8113 Benign paroxysmal vertigo, bilateral: Secondary | ICD-10-CM

## 2023-07-15 ENCOUNTER — Other Ambulatory Visit (HOSPITAL_BASED_OUTPATIENT_CLINIC_OR_DEPARTMENT_OTHER): Payer: Self-pay

## 2023-07-15 MED ORDER — CLINDAMYCIN HCL 150 MG PO CAPS
150.0000 mg | ORAL_CAPSULE | Freq: Four times a day (QID) | ORAL | 0 refills | Status: DC
  Filled 2023-07-15: qty 28, 7d supply, fill #0

## 2023-07-16 MED ORDER — GABAPENTIN 100 MG PO CAPS
ORAL_CAPSULE | ORAL | 3 refills | Status: DC
Start: 2023-07-16 — End: 2024-01-20
  Filled 2023-07-16 – 2023-07-18 (×2): qty 150, 30d supply, fill #0
  Filled 2023-09-12 (×2): qty 150, 30d supply, fill #1
  Filled 2023-10-07: qty 150, 30d supply, fill #2
  Filled 2023-12-26: qty 150, 30d supply, fill #3

## 2023-07-17 ENCOUNTER — Other Ambulatory Visit (HOSPITAL_COMMUNITY): Payer: Self-pay

## 2023-07-18 ENCOUNTER — Other Ambulatory Visit (HOSPITAL_COMMUNITY): Payer: Self-pay

## 2023-07-18 ENCOUNTER — Other Ambulatory Visit (HOSPITAL_BASED_OUTPATIENT_CLINIC_OR_DEPARTMENT_OTHER): Payer: Self-pay

## 2023-07-22 ENCOUNTER — Other Ambulatory Visit (HOSPITAL_BASED_OUTPATIENT_CLINIC_OR_DEPARTMENT_OTHER): Payer: Self-pay

## 2023-07-24 ENCOUNTER — Ambulatory Visit: Payer: Medicare Other | Admitting: Physical Therapy

## 2023-07-31 ENCOUNTER — Ambulatory Visit: Payer: Medicare Other | Admitting: Physical Therapy

## 2023-07-31 DIAGNOSIS — R42 Dizziness and giddiness: Secondary | ICD-10-CM

## 2023-07-31 DIAGNOSIS — R2681 Unsteadiness on feet: Secondary | ICD-10-CM

## 2023-07-31 NOTE — Therapy (Signed)
OUTPATIENT PHYSICAL THERAPY VESTIBULAR TREATMENT NOTE    Patient Name: Julia Gutierrez MRN: [id number] DOB:1955/10/13, 67 y.o., female Today's Date: 08/01/2023  END OF SESSION:  PT End of Session - 08/01/23 1650     Visit Number 2    Number of Visits 7    Date for PT Re-Evaluation 08/22/23   extend 1 week due to schedule availability   Authorization Type Medicare    Authorization Time Period 07-10-23 - 09-09-23    PT Start Time 1316    PT Stop Time 1400    PT Time Calculation (min) 44 min    Activity Tolerance Patient tolerated treatment well    Behavior During Therapy [facility] for tasks assessed/performed              Past Medical History:  Diagnosis Date   Acute cystitis without hematuria 08/28/2021   Adjustment reaction with anxiety and depression 05/23/2022   Allergy 2002   Anxiety and depression 04/13/2021   Anxiety and depression 04/13/2021   Cervical spondylosis 05/04/2021   Cervicogenic headache 04/13/2021   Chronic fatigue 04/13/2021   Depression    Encounter for annual physical exam 04/13/2021   Encounter to establish care 04/13/2021   Enlarged glands 06/18/2021   Heart murmur    Hypertension    Hypothyroidism    IBS (irritable bowel syndrome)    Increased thirst 04/13/2021   Mouth dryness 04/13/2021   Palpitations 04/13/2021   Stroke (HCC) Oct 2023   Vertigo 04/13/2021   Past Surgical History:  Procedure Laterality Date   CHOLECYSTECTOMY     Patient Active Problem List   Diagnosis Date Noted   Attention deficit hyperactivity disorder (ADHD), predominantly inattentive type 07/03/2023   Primary insomnia 07/03/2023   Dehydration 05/20/2023   Muscle spasms of neck 05/20/2023   Periumbilical abdominal pain 03/07/2023   Lumbar pain 12/23/2022   Atherosclerosis of aorta (HCC) 10/29/2022   Skin lesion 10/29/2022   Post-COVID-19 syndrome 10/20/2022   Primary hypertension 09/17/2022   Vertigo 08/05/2022   Neurological signs 06/13/2022   Memory changes  05/26/2022   B12 deficiency 02/24/2022   Colicky RUQ abdominal pain 02/24/2022   Rib pain on right side 12/04/2021   Sjogren syndrome with dental involvement (HCC) 08/28/2021   Dry mouth 06/18/2021   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   Benign paroxysmal vertigo of both ears 05/28/2021   Migraine with status migrainosus, not intractable 05/04/2021   Tooth infection 05/04/2021   Chronic fatigue 04/13/2021   Bilateral dry eyes 04/13/2021   Vision changes 04/13/2021    PCP: Tollie Eth, NP REFERRING PROVIDER: Newman Pies, MD  REFERRING DIAG:  Diagnosis  R42 (ICD-10-CM) - Dizziness  (Ramsay Hunt auricular syndrome)   THERAPY DIAG:  Dizziness and giddiness  Unsteadiness on feet  ONSET DATE: Jan. 2022  Rationale for Evaluation and Treatment: Rehabilitation  SUBJECTIVE:   SUBJECTIVE STATEMENT: Pt reports she feels dizzy at this time - moderately dizzy - says it was pretty good this am and increased as day progressed. Says she had sudden onset of severe vertigo a few days after the eval a couple of weeks ago - could not focus on anything - says things were moving up/down - lasted a few minutes and then had to lie down, and took Meclizine; continues to fluctuate but has mostly good days now for the most part. 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 No  PRECAUTIONS: None  RED FLAGS: None   WEIGHT BEARING RESTRICTIONS: No  FALLS: Has patient fallen in last 6 months? No  LIVING ENVIRONMENT: Lives with: lives alone Lives in: House/apartment Stairs: No Has following equipment at home: None  PLOF: Independent; pt works 6 days/week for 6 or 8 hours /day  PATIENT GOALS: "Get as better as I  can"  OBJECTIVE:  Note: Objective measures were completed at Evaluation unless otherwise noted.  DIAGNOSTIC FINDINGS: N/A  COGNITION: Overall cognitive status:  memory problems per pt report  Cervical ROM:  WNL's  STRENGTH: WNL's  GAIT: Gait pattern: WFL Distance walked: 73' Assistive device utilized: None Level of assistance: Modified independence Comments: slight unsteadiness with amb. With horizontal head turns  FUNCTIONAL TESTS:  MCTSIB: Condition 1: Avg of 3 trials: 30 sec, Condition 2: Avg of 3 trials: 30 sec, Condition 3: Avg of 3 trials: 30 sec, Condition 4: Avg of 3 trials: 30 sec, and Total Score: 120/120 Mild postural sway on condition 4 but no LOB PATIENT SURVEYS:  FOTO not captured by front office - to be completed next visit  VESTIBULAR ASSESSMENT:  GENERAL OBSERVATION: pt is a 67 yr old lady amb. Independently without use of assistive device   SYMPTOM BEHAVIOR:  Subjective history: Pt reports she has mostly imbalance and dysequilibrium - some days she gets very dizzy -  Non-Vestibular symptoms: neck pain, headaches, and pressure & itching  Type of dizziness: Imbalance (Disequilibrium), Spinning/Vertigo, Unsteady with head/body turns, Lightheadedness/Faint, and "Swimmyheaded"  Frequency: occurs daily  Duration: mostly constant   Aggravating factors: Induced by motion: occur when walking, turning body quickly, and turning head quickly, Worse with fatigue, Worse in the dark, and Occurs when standing still   Relieving factors: slow movements and avoid busy/distracting environments  Progression of symptoms: better  OCULOMOTOR EXAM:  Ocular Alignment: normal  Ocular ROM: No Limitations  Spontaneous Nystagmus: absent  Gaze-Induced Nystagmus: absent  Smooth Pursuits:  abnormal with horizontal smooth pursuits  Saccades: intact  Line 8/ line 3 - moderate dizziness  FRENZEL - FIXATION SUPRESSED:  Ocular Alignment: normal  Spontaneous Nystagmus:  absent  Gaze-Induced Nystagmus: absent   VESTIBULAR - OCULAR REFLEX:    Dynamic Visual Acuity: Static: Line 8 Dynamic: Line 3 = moderate c/o dizziness reported upon completion of test   POSITIONAL TESTING: Other: N/A  MOTION SENSITIVITY:  Motion Sensitivity Quotient Intensity: 0 = none, 1 = Lightheaded, 2 = Mild, 3 = Moderate, 4 = Severe, 5 = Vomiting  Intensity  1. Sitting to supine   2. Supine to L side   3. Supine to R side   4. Supine to sitting   5. L Hallpike-Dix   6. Up from L    7. R Hallpike-Dix   8. Up from R    9. Sitting, head tipped to L knee   10. Head up from L knee   11. Sitting, head tipped to R knee   12. Head up from R knee   13. Sitting head turns x5 3  14.Sitting head nods x5 2  15. In stance, 180 turn to L    16. In stance, 180 turn to R    FUNCTIONAL GAIT: MCTSIB: Condition 1: Avg of 3 trials: 30 sec, Condition 2: Avg of 3 trials: 30 sec, Condition 3: Avg of 3 trials: 30 sec, Condition 4:  Avg of 3 trials: 30 sec, and Total Score: 120/120   VESTIBULAR TREATMENT:                                                                                                   DATE: 07-31-23  Gaze Adaptation:  x1 Viewing Horizontal: Position: standing, Time: 60 secs, Reps: 1, and Comment:  target on plain background and x1 Viewing Vertical:  Position: standing, Time: 60 secs, and Reps: 1  Standing Balance: Surface: Airex Position: Feet Hip Width Apart Completed with: Eyes Open and Eyes Closed;  5  Marching on Airex - EO without any head movement 10 reps; head turns horizontally 5 reps with EO and then marching with vertical head turns 5 reps with EO with CGA Pt stood on Airex - performed stepping down to floor with one LE and looking to opposite side, then stepping back up onto Airex; 5 reps each side with CGA to min assist;  pt reported that looking left was worse than looking right   Pt stood on Airex - made circles clockwise with patterned ball 5 reps, then  counterclockwise 5 reps tracking ball with eyes and head moving - for improved gaze stabilization   Access Code: [email or web address] URL: [email or web address] Date: 08/01/2023 Prepared by: Maebelle Munroe  Exercises - Standing Balance with Eyes OPEN, then CLOSED on Foam  - 1 x daily - 7 x weekly - 1 sets - 1 reps  PATIENT EDUCATION: Education details: x1 viewing in standing;  HEP updated to include balance on foam - see above Person educated: Patient Education method: Explanation, Demonstration, and Handouts Education comprehension: verbalized understanding and returned demonstration  HOME EXERCISE PROGRAM:  Gaze stabilization - x1 viewing Balance on foam with EO and EC added on 07-31-23  Gaze Stabilization: Standing Feet Apart    Feet shoulder width apart, keeping eyes on target on wall _5-6___ feet away, tilt head down 15-30 and move head side to side for _60___ seconds. Repeat while moving head up and down for __60__ seconds. Do _3-5___ sessions per day. Repeat using target on pattern background.  GOALS: Goals reviewed with patient? Yes  SHORT TERM GOALS: same as LTG's as ELOS = 4 weeks   LONG TERM GOALS: Target date: 08-22-23  Improve DVA to </= 3 line difference to demo improvement in VOR function for improved gaze stabilization. Baseline: Line 8/ Line 3 (5 line difference) Goal status: INITIAL  2.  Complete FOTO and set LTG as appropriate. Baseline:  Goal status: INITIAL  3.  Pt will ambulate 38' with intermittent horizontal head turns with c/o dizziness </= 3/10 intensity for improved balance with environmental scanning.  Baseline:  Goal status: INITIAL  4.  Independent in HEP for vestibular/balance exercises and gaze stabilization. Baseline:  Goal status: INITIAL  5.  Increase FGA by at least 4 points to demo improvement in balance. Baseline: TBA Goal status: INITIAL   ASSESSMENT:  CLINICAL IMPRESSION: PT session focused on review of gaze  stabilization exercise with target on plain background with pt performing exercise in standing position for 60 secs; both directions provoked minimal  increased dizziness, with pt reporting dizziness at start of today's PT session.  Session also focused on updating HEP to include balance on foam for compliant surface training for increased vestibular input in maintaining balance.  Pt reported that today was not a good day with report of increased dizziness experienced with slight increase as day progressed.  Cont with POC.   OBJECTIVE IMPAIRMENTS: decreased balance, difficulty walking, and dizziness.   ACTIVITY LIMITATIONS: locomotion level  PARTICIPATION LIMITATIONS: shopping and community activity  PERSONAL FACTORS: Time since onset of injury/illness/exacerbation are also affecting patient's functional outcome.   REHAB POTENTIAL: Good  CLINICAL DECISION MAKING: Evolving/moderate complexity  EVALUATION COMPLEXITY: Moderate   PLAN:  PT FREQUENCY: 1x/week  PT DURATION: 6 weeks (anticipate 4 weeks)  PLANNED INTERVENTIONS: Therapeutic exercises, Therapeutic activity, Neuromuscular re-education, Balance training, Gait training, Patient/Family education, Self Care, and Vestibular training  PLAN FOR NEXT SESSION: check vestibular HEP (balance on foam issued on 07-31-23); do FOTO; Do FGA;    Ruffin Lada, Donavan Burnet, PT 08/01/2023, 4:51 PM

## 2023-08-01 ENCOUNTER — Encounter: Payer: Self-pay | Admitting: Physical Therapy

## 2023-08-14 ENCOUNTER — Ambulatory Visit: Payer: Medicare Other | Attending: Otolaryngology | Admitting: Physical Therapy

## 2023-08-27 NOTE — Progress Notes (Unsigned)
Office Visit Note  Patient: Julia Gutierrez             Date of Birth: December 14, 1955           MRN: [id number]             PCP: Tollie Eth, NP Referring: Tollie Eth, NP Visit Date: 09/09/2023   Subjective:  No chief complaint on file.   History of Present Illness: Julia Gutierrez is a 67 y.o. female here for follow up for persistent dry eyes and mouth here for reevaluation with ongoing symptoms of chronic dry dry mouth photosensitive skin rashes fatigue and persistent vertigo   Previous HPI 05/07/2023 Julia Gutierrez is a 67 y.o. female here for follow up for persistent dry eyes and mouth here for reevaluation with ongoing symptoms of chronic dry dry mouth photosensitive skin rashes fatigue and persistent vertigo last seen in 2022.  At that time workup was unremarkable for ANA or other serum inflammatory markers.  She noticed worsening of erythematous skin rashes this summer breaking out within an hour or less of sun exposure previously only got this after very prolonged time and at high altitudes.  Dryness is also bothersome using Systane eyedrops for this.  Her most activity limiting symptom is the vertigo cannot really do any aerobic exercises.  She had repeat lab testing in March now showing a ANA positive at 1: 640 titer although reflex ENA panel was negative.   Previous HPI 08/14/2021 Julia Gutierrez is a 67 y.o. female here for follow up for persistent dry eyes and mouth lab tests at initial visit showed low IgG subclass 4 level, negative SSA antibody, and mildly high WBC of 12.7 and platelets 415. Overall results reassuring without inflammatory changes at this time. She continues with persistent vertigo and migrainous type headaches.   Previous HPI 07/24/21 Julia Gutierrez is a 67 y.o. female here for chronic eye and mouth dryness and salivary gland enlargement. Symptoms are persistent since 2020 with fatigue and dry mouth complaints. She developed noticeable problems with dry eyes  since about September 2021. She experienced an episode of palsy in January identified as Ramsay Hunt syndrome with normal CT imaging obtained at that time and no specific underlying cause. This was treated with oral steroids for 3 total short courses so far. 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 She feels chronic fatigue and headaches as well. So far use of eye drops has not dramatically helped symptoms. Eye exam since this started apparently only significant for dry eyes. She has already tried use of oral lozenges or mouth rinse without impressive relief. She denies any finger or toe discoloration. She has no new rashes, hair loss, swollen glands, or ulcers. She has some chronic joint pain with past history of fibromyalgia syndrome but not a primary complaint at this time.   No Rheumatology ROS completed.   PMFS History:  Patient Active Problem List   Diagnosis Date Noted   Attention deficit hyperactivity disorder (ADHD), predominantly inattentive type 07/03/2023   Primary insomnia 07/03/2023   Dehydration 05/20/2023   Muscle spasms of neck 05/20/2023   Periumbilical abdominal pain 03/07/2023   Lumbar pain 12/23/2022   Atherosclerosis of aorta (HCC) 10/29/2022   Skin lesion 10/29/2022   Post-COVID-19 syndrome 10/20/2022   Primary hypertension 09/17/2022   Vertigo 08/05/2022   Neurological signs 06/13/2022   Memory changes 05/26/2022   B12 deficiency 02/24/2022   Colicky RUQ  abdominal pain 02/24/2022   Rib pain on right side 12/04/2021   Sjogren syndrome with dental involvement (HCC) 08/28/2021   Dry mouth 06/18/2021   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   Benign paroxysmal vertigo of both ears 05/28/2021   Migraine with status migrainosus, not intractable 05/04/2021   Tooth infection 05/04/2021   Chronic fatigue 04/13/2021   Bilateral dry eyes 04/13/2021   Vision changes 04/13/2021    Past Medical  History:  Diagnosis Date   Acute cystitis without hematuria 08/28/2021   Adjustment reaction with anxiety and depression 05/23/2022   Allergy 2002   Anxiety and depression 04/13/2021   Anxiety and depression 04/13/2021   Cervical spondylosis 05/04/2021   Cervicogenic headache 04/13/2021   Chronic fatigue 04/13/2021   Depression    Encounter for annual physical exam 04/13/2021   Encounter to establish care 04/13/2021   Enlarged glands 06/18/2021   Heart murmur    Hypertension    Hypothyroidism    IBS (irritable bowel syndrome)    Increased thirst 04/13/2021   Mouth dryness 04/13/2021   Palpitations 04/13/2021   Stroke (HCC) Oct 2023   Vertigo 04/13/2021    Family History  Problem Relation Age of Onset   Diabetes Mother    Hypertension Mother    Heart disease Mother    Arthritis Mother    Obesity Mother    Heart disease Father    Hypertension Father    Stroke Sister    Heart disease Sister    Hypertension Sister    Kidney failure Sister    Alcohol abuse Sister    Drug abuse Sister    Stroke Brother    Heart disease Brother    Hypertension Brother    Alcohol abuse Brother    Depression Brother    Drug abuse Brother    Early death Brother    Obesity Brother    Heart disease Brother    Hypertension Brother    Alcohol abuse Brother    Drug abuse Brother    Stroke Brother    Past Surgical History:  Procedure Laterality Date   CHOLECYSTECTOMY     Social History   Social History Narrative   Right handed   Caffeine 3 cups a day tea and energy drinks   Lives alone one level home   An apartment with her dog   Immunization History  Administered Date(s) Administered   [employer] Vaccination 09/02/2020, 09/03/2020     Objective: Vital Signs: There were no vitals taken for this visit.   Physical Exam   Musculoskeletal Exam: ***  CDAI Exam: CDAI Score: -- Patient Global: --; Provider Global: -- Swollen: --; Tender: -- Joint Exam 09/09/2023    No joint exam has been documented for this visit   There is currently no information documented on the homunculus. Go to the Rheumatology activity and complete the homunculus joint exam.  Investigation: No additional findings.  Imaging: No results found.  Recent Labs: Lab Results  Component Value Date   WBC 10.9 (H) 12/18/2022   HGB 14.2 12/18/2022   PLT 373 12/18/2022   NA 139 12/18/2022   K 3.3 (L) 12/18/2022   CL 100 12/18/2022   CO2 23 12/18/2022   GLUCOSE 98 12/18/2022   BUN 21 12/18/2022   CREATININE 0.79 12/18/2022   BILITOT 0.4 12/18/2022   ALKPHOS 92 12/18/2022   AST 19 12/18/2022   ALT 19 12/18/2022   PROT 7.6 12/18/2022   ALBUMIN 4.3 12/18/2022   CALCIUM 9.9 12/18/2022  Speciality Comments: No specialty comments available.  Procedures:  No procedures performed Allergies: Promethazine   Assessment / Plan:     Visit Diagnoses: No diagnosis found.  ***  Orders: No orders of the defined types were placed in this encounter.  No orders of the defined types were placed in this encounter.    Follow-Up Instructions: No follow-ups on file.   Metta Clines, RT  Note - This record has been created using [employer].  Chart creation errors have been sought, but may not always  have been located. Such creation errors do not reflect on  the standard of medical care.

## 2023-08-28 ENCOUNTER — Encounter: Payer: Medicare Other | Admitting: Physical Therapy

## 2023-09-02 ENCOUNTER — Ambulatory Visit: Payer: Medicare Other

## 2023-09-09 ENCOUNTER — Ambulatory Visit: Payer: Medicare Other | Admitting: Internal Medicine

## 2023-09-09 DIAGNOSIS — R682 Dry mouth, unspecified: Secondary | ICD-10-CM

## 2023-09-09 DIAGNOSIS — H8113 Benign paroxysmal vertigo, bilateral: Secondary | ICD-10-CM

## 2023-09-09 DIAGNOSIS — B0221 Postherpetic geniculate ganglionitis: Secondary | ICD-10-CM

## 2023-09-09 DIAGNOSIS — H04123 Dry eye syndrome of bilateral lacrimal glands: Secondary | ICD-10-CM

## 2023-09-09 DIAGNOSIS — Z79899 Other long term (current) drug therapy: Secondary | ICD-10-CM

## 2023-09-09 DIAGNOSIS — M350C Sjogren syndrome with dental involvement: Secondary | ICD-10-CM

## 2023-09-12 ENCOUNTER — Other Ambulatory Visit (HOSPITAL_BASED_OUTPATIENT_CLINIC_OR_DEPARTMENT_OTHER): Payer: Self-pay

## 2023-09-23 ENCOUNTER — Other Ambulatory Visit: Payer: Self-pay | Admitting: Nurse Practitioner

## 2023-09-25 ENCOUNTER — Encounter (HOSPITAL_BASED_OUTPATIENT_CLINIC_OR_DEPARTMENT_OTHER): Payer: Self-pay | Admitting: Pharmacist

## 2023-09-25 ENCOUNTER — Other Ambulatory Visit (HOSPITAL_BASED_OUTPATIENT_CLINIC_OR_DEPARTMENT_OTHER): Payer: Self-pay

## 2023-09-26 ENCOUNTER — Telehealth: Payer: Medicare Other | Admitting: Physician Assistant

## 2023-09-26 ENCOUNTER — Other Ambulatory Visit (HOSPITAL_BASED_OUTPATIENT_CLINIC_OR_DEPARTMENT_OTHER): Payer: Self-pay

## 2023-09-26 DIAGNOSIS — K047 Periapical abscess without sinus: Secondary | ICD-10-CM | POA: Diagnosis not present

## 2023-09-26 MED ORDER — AMOXICILLIN-POT CLAVULANATE 875-125 MG PO TABS
1.0000 | ORAL_TABLET | Freq: Two times a day (BID) | ORAL | 0 refills | Status: DC
Start: 2023-09-26 — End: 2023-10-02
  Filled 2023-09-26: qty 20, 10d supply, fill #0

## 2023-09-26 NOTE — Progress Notes (Signed)
Virtual Visit Consent   [name], you are scheduled for a virtual visit with a [facility] provider today. Just as with appointments in the office, your consent must be obtained to participate. Your consent will be active for this visit and any virtual visit you may have with one of our providers in the next 365 days. If you have a MyChart account, a copy of this consent can be sent to you electronically.  As this is a virtual visit, video technology does not allow for your provider to perform a traditional examination. This may limit your provider's ability to fully assess your condition. If your provider identifies any concerns that need to be evaluated in person or the need to arrange testing (such as labs, EKG, etc.), we will make arrangements to do so. Although advances in technology are sophisticated, we cannot ensure that it will always work on either your end or our end. If the connection with a video visit is poor, the visit may have to be switched to a telephone visit. With either a video or telephone visit, we are not always able to ensure that we have a secure connection.  By engaging in this virtual visit, you consent to the provision of healthcare and authorize for your insurance to be billed (if applicable) for the services provided during this visit. Depending on your insurance coverage, you may receive a charge related to this service.  I need to obtain your verbal consent now. Are you willing to proceed with your visit today? [name] has provided verbal consent on 09/26/2023 for a virtual visit (video or telephone). [name], PA-C  Date: 09/26/2023 3:45 PM  Virtual Visit via Video Note   I, [name], connected with  Julia Gutierrez  ([id number], June 14, 1956) on 09/26/23 at  3:45 PM EST by a video-enabled telemedicine application and verified that I am speaking with the correct person using two identifiers.  Location: Patient: Virtual Visit Location  Patient: Mobile Provider: Virtual Visit Location Provider: Home Office   I discussed the limitations of evaluation and management by telemedicine and the availability of in person appointments. The patient expressed understanding and agreed to proceed.    History of Present Illness: Julia Gutierrez is a 67 y.o. who identifies as a female who was assigned female at birth, and is being seen today for dental pain.  HPI: Dental Pain  This is a new problem. The current episode started in the past 7 days (several teeth on bottom that are infected and causing pain; started about 3 days ago). The problem occurs constantly. The problem has been gradually worsening. The pain is moderate. Associated symptoms include facial pain (lower jaw), oral bleeding and thermal sensitivity. Pertinent negatives include no difficulty swallowing, fever or sinus pressure. She has tried NSAIDs (ASA and Ibuprofen) for the symptoms. The treatment provided no relief.   Has appt with Dentist on 09/29/23.   Problems:  Patient Active Problem List   Diagnosis Date Noted   Attention deficit hyperactivity disorder (ADHD), predominantly inattentive type 07/03/2023   Primary insomnia 07/03/2023   Dehydration 05/20/2023   Muscle spasms of neck 05/20/2023   Periumbilical abdominal pain 03/07/2023   Lumbar pain 12/23/2022   Atherosclerosis of aorta (HCC) 10/29/2022   Skin lesion 10/29/2022   Post-COVID-19 syndrome 10/20/2022   Primary hypertension 09/17/2022   Vertigo 08/05/2022   Neurological signs 06/13/2022   Memory changes 05/26/2022   B12 deficiency 02/24/2022   Colicky RUQ abdominal pain 02/24/2022  Rib pain on right side 12/04/2021   Sjogren syndrome with dental involvement (HCC) 08/28/2021   Dry mouth 06/18/2021   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   Benign paroxysmal vertigo of both ears 05/28/2021   Migraine with status migrainosus, not intractable 05/04/2021   Tooth infection 05/04/2021   Chronic  fatigue 04/13/2021   Bilateral dry eyes 04/13/2021   Vision changes 04/13/2021    Allergies:  Allergies  Allergen Reactions   Promethazine Other (See Comments)    Muscle spasms Other reaction(s): Other (See Comments) Muscle spasms Muscle spasms   Medications:  Current Outpatient Medications:    amoxicillin-clavulanate (AUGMENTIN) 875-125 MG tablet, Take 1 tablet by mouth 2 (two) times daily., Disp: 20 tablet, Rfl: 0   acetaminophen (TYLENOL) 325 MG tablet, Take 650 mg by mouth every 6 (six) hours as needed., Disp: , Rfl:    amLODipine (NORVASC) 5 MG tablet, Take 1 tablet (5 mg total) by mouth daily., Disp: 90 tablet, Rfl: 3   aspirin 325 MG tablet, Take 325 mg by mouth daily., Disp: , Rfl:    cyanocobalamin (VITAMIN B12) 1000 MCG/ML injection, 1 mL (1,000 mcg total) every 30 (thirty) days., Disp: 3 mL, Rfl: 3   fluticasone (FLONASE) 50 MCG/ACT nasal spray, Place 2 sprays into both nostrils daily., Disp: 16 g, Rfl: 6   gabapentin (NEURONTIN) 100 MG capsule, Take 1 capsule (100 mg total) by mouth 2 (two) times daily AND 3 capsules (300 mg total) at bedtime. Use as needed for neuropathic pain., Disp: 150 capsule, Rfl: 3   hydrochlorothiazide (HYDRODIURIL) 25 MG tablet, Take 1 tablet (25 mg total) by mouth daily., Disp: 90 tablet, Rfl: 3   hydroxychloroquine (PLAQUENIL) 200 MG tablet, Take 1 tablet (200 mg total) by mouth daily. (Patient not taking: Reported on 05/28/2023), Disp: 30 tablet, Rfl: 2   methylphenidate (RITALIN) 10 MG tablet, Take 1 tablet (10 mg total) by mouth 2 (two) times daily with breakfast and lunch., Disp: 60 tablet, Rfl: 0   Omega-3 Fatty Acids (FISH OIL PO), Take by mouth., Disp: , Rfl:    POTASSIUM PO, Take by mouth., Disp: , Rfl:    Vitamin D, Ergocalciferol, (DRISDOL) 1.25 MG (50000 UNIT) CAPS capsule, Take 1 capsule (50,000 Units total) by mouth every 7 (seven) days., Disp: 12 capsule, Rfl: 3   Observations/Objective: Patient is well-developed, well-nourished in  no acute distress.  Resting comfortably   Head is normocephalic, atraumatic.  No labored breathing.  Speech is clear and coherent with logical content.  Patient is alert and oriented at baseline.    Assessment and Plan: 1. Dental abscess (Primary) - amoxicillin-clavulanate (AUGMENTIN) 875-125 MG tablet; Take 1 tablet by mouth 2 (two) times daily.  Dispense: 20 tablet; Refill: 0  - Suspected infection  - Augmentin - Can use ice on outside jaw/cheek for swelling - Can also take tylenol and Ibuprofen for pain with other medications - Keep scheduled dental appt on 09/29/23 - Seek in person evaluation if symptoms fail to improve or if they worsen   Follow Up Instructions: I discussed the assessment and treatment plan with the patient. The patient was provided an opportunity to ask questions and all were answered. The patient agreed with the plan and demonstrated an understanding of the instructions.  A copy of instructions were sent to the patient via MyChart unless otherwise noted below.    The patient was advised to call back or seek an in-person evaluation if the symptoms worsen or if the condition fails to improve  as anticipated.    [name], PA-C

## 2023-09-26 NOTE — Patient Instructions (Signed)
Julia Gutierrez, thank you for joining [name], PA-C for today's virtual visit.  While this provider is not your primary care provider (PCP), if your PCP is located in our provider database this encounter information will be shared with them immediately following your visit.   A [facility] MyChart account gives you access to today's visit and all your visits, tests, and labs performed at [facility] " click here if you don't have a [facility] MyChart account or go to [email or web address]  Consent: (Patient) Julia Gutierrez provided verbal consent for this virtual visit at the beginning of the encounter.  Current Medications:  Current Outpatient Medications:    amoxicillin-clavulanate (AUGMENTIN) 875-125 MG tablet, Take 1 tablet by mouth 2 (two) times daily., Disp: 20 tablet, Rfl: 0   acetaminophen (TYLENOL) 325 MG tablet, Take 650 mg by mouth every 6 (six) hours as needed., Disp: , Rfl:    amLODipine (NORVASC) 5 MG tablet, Take 1 tablet (5 mg total) by mouth daily., Disp: 90 tablet, Rfl: 3   aspirin 325 MG tablet, Take 325 mg by mouth daily., Disp: , Rfl:    cyanocobalamin (VITAMIN B12) 1000 MCG/ML injection, 1 mL (1,000 mcg total) every 30 (thirty) days., Disp: 3 mL, Rfl: 3   fluticasone (FLONASE) 50 MCG/ACT nasal spray, Place 2 sprays into both nostrils daily., Disp: 16 g, Rfl: 6   gabapentin (NEURONTIN) 100 MG capsule, Take 1 capsule (100 mg total) by mouth 2 (two) times daily AND 3 capsules (300 mg total) at bedtime. Use as needed for neuropathic pain., Disp: 150 capsule, Rfl: 3   hydrochlorothiazide (HYDRODIURIL) 25 MG tablet, Take 1 tablet (25 mg total) by mouth daily., Disp: 90 tablet, Rfl: 3   hydroxychloroquine (PLAQUENIL) 200 MG tablet, Take 1 tablet (200 mg total) by mouth daily. (Patient not taking: Reported on 05/28/2023), Disp: 30 tablet, Rfl: 2   methylphenidate (RITALIN) 10 MG tablet, Take 1 tablet (10 mg total) by mouth 2 (two) times daily with  breakfast and lunch., Disp: 60 tablet, Rfl: 0   Omega-3 Fatty Acids (FISH OIL PO), Take by mouth., Disp: , Rfl:    POTASSIUM PO, Take by mouth., Disp: , Rfl:    Vitamin D, Ergocalciferol, (DRISDOL) 1.25 MG (50000 UNIT) CAPS capsule, Take 1 capsule (50,000 Units total) by mouth every 7 (seven) days., Disp: 12 capsule, Rfl: 3   Medications ordered in this encounter:  Meds ordered this encounter  Medications   amoxicillin-clavulanate (AUGMENTIN) 875-125 MG tablet    Sig: Take 1 tablet by mouth 2 (two) times daily.    Dispense:  20 tablet    Refill:  0    Supervising Provider:   Merrilee Jansky [[id number]]     *If you need refills on other medications prior to your next appointment, please contact your pharmacy*  Follow-Up: Call back or seek an in-person evaluation if the symptoms worsen or if the condition fails to improve as anticipated.  [facility] [phone]  Other Instructions Dental Abscess  A dental abscess is an infection around a tooth that may involve pain, swelling, and a collection of pus, as well as other symptoms. Treatment is important to help with symptoms and to prevent the infection from spreading. The general types of dental abscesses are: Pulpal abscess. This abscess may form from the inner part of the tooth (pulp). Periodontal abscess. This abscess may form from the gum. What are the causes? This condition is caused by a bacterial infection in or around  the tooth. It may result from: Severe tooth decay (cavities). Trauma to the tooth, such as a broken or chipped tooth. What increases the risk? This condition is more likely to develop in males. It is also more likely to develop in people who: Have cavities. Have severe gum disease. Eat sugary snacks between meals. Use tobacco products. Have diabetes. Have a weakened disease-fighting system (immune system). Do not brush and care for their teeth regularly. What are the signs or symptoms? Mild  symptoms of this condition include: Tenderness. Bad breath. Fever. A bitter taste in the mouth. Pain in and around the infected tooth. Moderate symptoms of this condition include: Swollen neck glands. Chills. Pus drainage. Swelling and redness around the infected tooth, in the mouth, or in the face. Severe pain in and around the infected tooth. Severe symptoms of this condition include: Difficulty swallowing. Difficulty opening the mouth. Nausea. Vomiting. How is this diagnosed? This condition is diagnosed based on: Your symptoms and your medical and dental history. An examination of the infected tooth. During the exam, your dental care provider may tap on the infected tooth. You may also need to have X-rays taken of the affected area. How is this treated? This condition is treated by getting rid of the infection. This may be done with: Antibiotic medicines. These may be used in certain situations. Antibacterial mouth rinse. Incision and drainage. This procedure is done by making an incision in the abscess to drain out the pus. Removing pus is the first priority in treating an abscess. A root canal. This may be performed to save the tooth. Your dental care provider accesses the visible part of your tooth (crown) with a drill and removes any infected pulp. Then the space is filled and sealed off. Tooth extraction. The tooth is pulled out if it cannot be saved by other treatment. You may also receive treatment for pain, such as: Acetaminophen or NSAIDs. Gels that contain a numbing medicine. An injection to block the pain near your nerve. Follow these instructions at home: Medicines Take over-the-counter and prescription medicines only as told by your dental care provider. If you were prescribed an antibiotic, take it as told by your dental care provider. Do not stop taking the antibiotic even if you start to feel better. If you were prescribed a gel that contains a numbing  medicine, use it exactly as told in the directions. Do not use these gels for children who are younger than 11 years of age. Use an antibacterial mouth rinse as told by your dental care provider. General instructions  Gargle with a mixture of salt and water 3-4 times a day or as needed. To make salt water, completely dissolve -1 tsp (3-6 g) of salt in 1 cup (237 mL) of warm water. Eat a soft diet while your abscess is healing. Drink enough fluid to keep your urine pale yellow. Do not apply heat to the outside of your mouth. Do not use any products that contain nicotine or tobacco. These products include cigarettes, chewing tobacco, and vaping devices, such as e-cigarettes. If you need help quitting, ask your dental care provider. Keep all follow-up visits. This is important. How is this prevented?  Excellent dental home care, which includes brushing your teeth every morning and night with fluoride toothpaste. Floss one time each day. Get regularly scheduled dental cleanings. Consider having a dental sealant applied on teeth that have deep grooves to prevent cavities. Drink fluoridated water regularly. This includes most tap water. Check the  label on bottled water to see if it contains fluoride. Reduce or eliminate sugary drinks. Eat healthy meals and snacks. Wear a mouth guard or face shield to protect your teeth while playing sports. Contact a health care provider if: Your pain is worse and is not helped by medicine. You have swelling. You see pus around the tooth. You have a fever or chills. Get help right away if: Your symptoms suddenly get worse. You have a very bad headache. You have problems breathing or swallowing. You have trouble opening your mouth. You have swelling in your neck or around your eye. These symptoms may represent a serious problem that is an emergency. Do not wait to see if the symptoms will go away. Get medical help right away. Call your local emergency services  (911 in the [place] Do not drive yourself to the hospital. Summary A dental abscess is a collection of pus in or around a tooth that results from an infection. A dental abscess may result from severe tooth decay, trauma to the tooth, or severe gum disease around a tooth. Symptoms include severe pain, swelling, redness, and drainage of pus in and around the infected tooth. The first priority in treating a dental abscess is to drain out the pus. Treatment may also involve removing damage inside the tooth (root canal) or extracting the tooth. This information is not intended to replace advice given to you by your health care provider. Make sure you discuss any questions you have with your health care provider. Document Revised: 11/30/2020 Document Reviewed: 11/30/2020 Elsevier Patient Education  2024 Elsevier Inc.    If you have been instructed to have an in-person evaluation today at a local Urgent Care facility, please use the link below. It will take you to a list of all of our available [facility], including address, phone number and hours of operation. Please do not delay care.  [facility]  If you or a family member do not have a primary care provider, use the link below to schedule a visit and establish care. When you choose a [facility] primary care physician or advanced practice provider, you gain a long-term partner in health. Find a Primary Care Provider  Learn more about [facility] in-office and virtual care options:  - Get Care Now

## 2023-10-02 ENCOUNTER — Other Ambulatory Visit (HOSPITAL_BASED_OUTPATIENT_CLINIC_OR_DEPARTMENT_OTHER): Payer: Self-pay

## 2023-10-02 ENCOUNTER — Encounter: Payer: Self-pay | Admitting: Nurse Practitioner

## 2023-10-02 ENCOUNTER — Other Ambulatory Visit: Payer: Self-pay

## 2023-10-02 ENCOUNTER — Ambulatory Visit (INDEPENDENT_AMBULATORY_CARE_PROVIDER_SITE_OTHER): Payer: Medicare Other | Admitting: Nurse Practitioner

## 2023-10-02 VITALS — BP 132/70 | HR 77 | Wt 183.4 lb

## 2023-10-02 DIAGNOSIS — Z13820 Encounter for screening for osteoporosis: Secondary | ICD-10-CM

## 2023-10-02 DIAGNOSIS — M350C Sjogren syndrome with dental involvement: Secondary | ICD-10-CM | POA: Diagnosis not present

## 2023-10-02 DIAGNOSIS — N959 Unspecified menopausal and perimenopausal disorder: Secondary | ICD-10-CM

## 2023-10-02 DIAGNOSIS — Z1231 Encounter for screening mammogram for malignant neoplasm of breast: Secondary | ICD-10-CM | POA: Diagnosis not present

## 2023-10-02 DIAGNOSIS — K047 Periapical abscess without sinus: Secondary | ICD-10-CM | POA: Diagnosis not present

## 2023-10-02 DIAGNOSIS — E860 Dehydration: Secondary | ICD-10-CM

## 2023-10-02 DIAGNOSIS — R7301 Impaired fasting glucose: Secondary | ICD-10-CM

## 2023-10-02 DIAGNOSIS — I10 Essential (primary) hypertension: Secondary | ICD-10-CM

## 2023-10-02 MED ORDER — AMLODIPINE BESYLATE-VALSARTAN 5-160 MG PO TABS
1.0000 | ORAL_TABLET | Freq: Every day | ORAL | 1 refills | Status: DC
  Filled 2023-10-02: qty 90, 90d supply, fill #0

## 2023-10-02 MED ORDER — TRAMADOL HCL 50 MG PO TABS
50.0000 mg | ORAL_TABLET | Freq: Three times a day (TID) | ORAL | 0 refills | Status: DC | PRN
Start: 2023-10-02 — End: 2023-10-29
  Filled 2023-10-02: qty 15, 5d supply, fill #0

## 2023-10-02 MED ORDER — PILOCARPINE HCL 5 MG PO TABS
5.0000 mg | ORAL_TABLET | Freq: Two times a day (BID) | ORAL | 2 refills | Status: DC
Start: 2023-10-02 — End: 2023-10-29
  Filled 2023-10-02: qty 60, 30d supply, fill #0

## 2023-10-02 MED ORDER — CLINDAMYCIN HCL 300 MG PO CAPS
300.0000 mg | ORAL_CAPSULE | Freq: Three times a day (TID) | ORAL | 3 refills | Status: DC
  Filled 2023-10-02: qty 30, 10d supply, fill #0

## 2023-10-02 NOTE — Progress Notes (Unsigned)
  [name], DNP, AGNP-c [facility]  [address] [place], [place] [address] [phone]  ESTABLISHED PATIENT- Chronic Health and/or Follow-Up Visit  Blood pressure 132/70, pulse 77, weight 183 lb 6.4 oz (83.2 kg).    Julia Gutierrez is a 67 y.o. year old female presenting today for evaluation and management of chronic conditions.     All ROS negative with exception of what is listed above.   PHYSICAL EXAM Physical Exam   PLAN Problem List Items Addressed This Visit   None   No follow-ups on file.  [name], DNP, AGNP-c

## 2023-10-02 NOTE — Patient Instructions (Signed)
Blood pressure goal less than 130/85

## 2023-10-07 NOTE — Assessment & Plan Note (Signed)
 Dehydration exacerbated by fever and difficulty eating and drinking. Nationwide shortage of IV fluids noted. Discussed risks of dehydration including potential severe complications if not managed. Emphasized importance of oral hydration. - Encourage oral hydration - Monitor symptoms

## 2023-10-07 NOTE — Assessment & Plan Note (Signed)
 Severe dental infection with numbness and significant pain. Currently on Augmentin  with slow improvement. Sjogren's syndrome contributing to dental issues. Discussed potential benefits of clindamycin  based on past effectiveness. Risks of untreated infection include worsening pain and systemic spread. - Administer Rosadan injection - Prescribe tramadol  for pain management - Continue Augmentin  - Consider clindamycin  if no improvement - Encourage salt water rinses

## 2023-10-07 NOTE — Assessment & Plan Note (Signed)
 Sjogren's syndrome causing severe dry mouth and dental issues. Previous use of pilocarpine  (Salagen ) was beneficial but discontinued. Discussed restarting pilocarpine  to manage symptoms. Potential side effects and benefits reviewed. - Restart pilocarpine  (Salagen )

## 2023-10-07 NOTE — Assessment & Plan Note (Signed)
 On HCTZ and amlodipine  for hypertension. Reports difficulty managing potassium levels due to HCTZ. Discussed switching to amlodipine -valsartan  combination to avoid potassium depletion. Benefits include simplified medication regimen and reduced risk of hypokalemia. - Discontinue HCTZ and amlodipine  - Prescribe amlodipine -valsartan  combination

## 2023-10-19 ENCOUNTER — Telehealth: Payer: Medicare Other

## 2023-10-19 DIAGNOSIS — T3695XA Adverse effect of unspecified systemic antibiotic, initial encounter: Secondary | ICD-10-CM

## 2023-10-19 DIAGNOSIS — B379 Candidiasis, unspecified: Secondary | ICD-10-CM | POA: Diagnosis not present

## 2023-10-20 ENCOUNTER — Other Ambulatory Visit (HOSPITAL_BASED_OUTPATIENT_CLINIC_OR_DEPARTMENT_OTHER): Payer: Self-pay

## 2023-10-20 MED ORDER — FLUCONAZOLE 150 MG PO TABS
150.0000 mg | ORAL_TABLET | ORAL | 0 refills | Status: DC | PRN
  Filled 2023-10-20: qty 2, 6d supply, fill #0

## 2023-10-20 NOTE — Progress Notes (Signed)
 E-Visit for Vaginal Symptoms  We are sorry that you are not feeling well. Here is how we plan to help! Based on what you shared with me it looks like you: May have a yeast vaginosis  Vaginosis is an inflammation of the vagina that can result in discharge, itching and pain. The cause is usually a change in the normal balance of vaginal bacteria or an infection. Vaginosis can also result from reduced estrogen levels after menopause.  The most common causes of vaginosis are:   Bacterial vaginosis which results from an overgrowth of one on several organisms that are normally present in your vagina.   Yeast infections which are caused by a naturally occurring fungus called candida.   Vaginal atrophy (atrophic vaginosis) which results from the thinning of the vagina from reduced estrogen levels after menopause.   Trichomoniasis which is caused by a parasite and is commonly transmitted by sexual intercourse.  Factors that increase your risk of developing vaginosis include: Medications, such as antibiotics and steroids Uncontrolled diabetes Use of hygiene products such as bubble bath, vaginal spray or vaginal deodorant Douching Wearing damp or tight-fitting clothing Using an intrauterine device (IUD) for birth control Hormonal changes, such as those associated with pregnancy, birth control pills or menopause Sexual activity Having a sexually transmitted infection  Your treatment plan is A single Diflucan  (fluconazole ) 150mg  tablet once.  I have electronically sent this prescription into the pharmacy that you have chosen. I have sent an extra tablet to take once the antibiotic is completed.  Be sure to take all of the medication as directed. Stop taking any medication if you develop a rash, tongue swelling or shortness of breath. Mothers who are breast feeding should consider pumping and discarding their breast milk while on these antibiotics. However, there is no consensus that infant exposure  at these doses would be harmful.  Remember that medication creams can weaken latex condoms. SABRA   HOME CARE:  Good hygiene may prevent some types of vaginosis from recurring and may relieve some symptoms:  Avoid baths, hot tubs and whirlpool spas. Rinse soap from your outer genital area after a shower, and dry the area well to prevent irritation. Don't use scented or harsh soaps, such as those with deodorant or antibacterial action. Avoid irritants. These include scented tampons and pads. Wipe from front to back after using the toilet. Doing so avoids spreading fecal bacteria to your vagina.  Other things that may help prevent vaginosis include:  Don't douche. Your vagina doesn't require cleansing other than normal bathing. Repetitive douching disrupts the normal organisms that reside in the vagina and can actually increase your risk of vaginal infection. Douching won't clear up a vaginal infection. Use a latex condom. Both female and female latex condoms may help you avoid infections spread by sexual contact. Wear cotton underwear. Also wear pantyhose with a cotton crotch. If you feel comfortable without it, skip wearing underwear to bed. Yeast thrives in [place] Your symptoms should improve in the next day or two.  GET HELP RIGHT AWAY IF:  You have pain in your lower abdomen ( pelvic area or over your ovaries) You develop nausea or vomiting You develop a fever Your discharge changes or worsens You have persistent pain with intercourse You develop shortness of breath, a rapid pulse, or you faint.  These symptoms could be signs of problems or infections that need to be evaluated by a medical provider now.  MAKE SURE YOU   Understand these instructions.  Will watch your condition. Will get help right away if you are not doing well or get worse.  Thank you for choosing an e-visit.  Your e-visit answers were reviewed by a board certified advanced clinical practitioner to  complete your personal care plan. Depending upon the condition, your plan could have included both over the counter or prescription medications.  Please review your pharmacy choice. Make sure the pharmacy is open so you can pick up prescription now. If there is a problem, you may contact your provider through [employer] and have the prescription routed to another pharmacy.  Your safety is important to us . If you have drug allergies check your prescription carefully.   For the next 24 hours you can use MyChart to ask questions about today's visit, request a non-urgent call back, or ask for a work or school excuse. You will get an email in the next two days asking about your experience. I hope that your e-visit has been valuable and will speed your recovery.   I have spent 5 minutes in review of e-visit questionnaire, review and updating patient chart, medical decision making and response to patient.   Delon CHRISTELLA Dickinson, PA-C

## 2023-10-26 ENCOUNTER — Encounter: Payer: Self-pay | Admitting: Nurse Practitioner

## 2023-10-27 ENCOUNTER — Other Ambulatory Visit: Payer: Self-pay

## 2023-10-27 ENCOUNTER — Encounter (HOSPITAL_BASED_OUTPATIENT_CLINIC_OR_DEPARTMENT_OTHER): Payer: Self-pay | Admitting: Emergency Medicine

## 2023-10-27 ENCOUNTER — Observation Stay (HOSPITAL_BASED_OUTPATIENT_CLINIC_OR_DEPARTMENT_OTHER)
Admission: EM | Admit: 2023-10-27 | Discharge: 2023-10-29 | Disposition: A | Discharge status: Home / Self Care | Payer: Medicare Other | Attending: Internal Medicine | Admitting: Internal Medicine

## 2023-10-27 ENCOUNTER — Emergency Department (HOSPITAL_BASED_OUTPATIENT_CLINIC_OR_DEPARTMENT_OTHER): Payer: Medicare Other | Admitting: Radiology

## 2023-10-27 ENCOUNTER — Emergency Department (HOSPITAL_BASED_OUTPATIENT_CLINIC_OR_DEPARTMENT_OTHER): Payer: Medicare Other

## 2023-10-27 DIAGNOSIS — E876 Hypokalemia: Secondary | ICD-10-CM | POA: Diagnosis not present

## 2023-10-27 DIAGNOSIS — F419 Anxiety disorder, unspecified: Secondary | ICD-10-CM | POA: Insufficient documentation

## 2023-10-27 DIAGNOSIS — E039 Hypothyroidism, unspecified: Secondary | ICD-10-CM | POA: Insufficient documentation

## 2023-10-27 DIAGNOSIS — R5382 Chronic fatigue, unspecified: Secondary | ICD-10-CM | POA: Insufficient documentation

## 2023-10-27 DIAGNOSIS — I252 Old myocardial infarction: Secondary | ICD-10-CM

## 2023-10-27 DIAGNOSIS — H8113 Benign paroxysmal vertigo, bilateral: Secondary | ICD-10-CM

## 2023-10-27 DIAGNOSIS — Z8673 Personal history of transient ischemic attack (TIA), and cerebral infarction without residual deficits: Secondary | ICD-10-CM | POA: Insufficient documentation

## 2023-10-27 DIAGNOSIS — I350 Nonrheumatic aortic (valve) stenosis: Secondary | ICD-10-CM | POA: Insufficient documentation

## 2023-10-27 DIAGNOSIS — R918 Other nonspecific abnormal finding of lung field: Secondary | ICD-10-CM | POA: Insufficient documentation

## 2023-10-27 DIAGNOSIS — I2584 Coronary atherosclerosis due to calcified coronary lesion: Secondary | ICD-10-CM | POA: Diagnosis not present

## 2023-10-27 DIAGNOSIS — F32A Depression, unspecified: Secondary | ICD-10-CM | POA: Diagnosis not present

## 2023-10-27 DIAGNOSIS — I214 Non-ST elevation (NSTEMI) myocardial infarction: Secondary | ICD-10-CM | POA: Diagnosis not present

## 2023-10-27 DIAGNOSIS — I160 Hypertensive urgency: Secondary | ICD-10-CM | POA: Insufficient documentation

## 2023-10-27 DIAGNOSIS — B0221 Postherpetic geniculate ganglionitis: Secondary | ICD-10-CM

## 2023-10-27 DIAGNOSIS — K5730 Diverticulosis of large intestine without perforation or abscess without bleeding: Secondary | ICD-10-CM | POA: Insufficient documentation

## 2023-10-27 DIAGNOSIS — Z7982 Long term (current) use of aspirin: Secondary | ICD-10-CM | POA: Insufficient documentation

## 2023-10-27 DIAGNOSIS — Z79899 Other long term (current) drug therapy: Secondary | ICD-10-CM | POA: Diagnosis not present

## 2023-10-27 DIAGNOSIS — Z7902 Long term (current) use of antithrombotics/antiplatelets: Secondary | ICD-10-CM | POA: Insufficient documentation

## 2023-10-27 DIAGNOSIS — R079 Chest pain, unspecified: Secondary | ICD-10-CM | POA: Diagnosis present

## 2023-10-27 DIAGNOSIS — K449 Diaphragmatic hernia without obstruction or gangrene: Secondary | ICD-10-CM | POA: Diagnosis not present

## 2023-10-27 DIAGNOSIS — I10 Essential (primary) hypertension: Secondary | ICD-10-CM | POA: Diagnosis not present

## 2023-10-27 HISTORY — DX: Old myocardial infarction: I25.2

## 2023-10-27 LAB — TROPONIN I (HIGH SENSITIVITY)
Troponin I (High Sensitivity): 278 ng/L (ref <18)
Troponin I (High Sensitivity): 296 ng/L (ref <18)

## 2023-10-27 LAB — CBC
HCT: 39.8 % (ref 36.0–46.0)
Hemoglobin: 13 g/dL (ref 12.0–15.0)
MCH: 27.1 pg (ref 26.0–34.0)
MCHC: 32.7 g/dL (ref 30.0–36.0)
MCV: 82.9 fL (ref 80.0–100.0)
Platelets: 411 10*3/uL — ABNORMAL HIGH (ref 150–400)
RBC: 4.8 MIL/uL (ref 3.87–5.11)
RDW: 15.7 % — ABNORMAL HIGH (ref 11.5–15.5)
WBC: 13.4 10*3/uL — ABNORMAL HIGH (ref 4.0–10.5)
nRBC: 0 % (ref 0.0–0.2)

## 2023-10-27 LAB — BASIC METABOLIC PANEL
Anion gap: 9 (ref 5–15)
BUN: 20 mg/dL (ref 8–23)
CO2: 27 mmol/L (ref 22–32)
Calcium: 9.8 mg/dL (ref 8.9–10.3)
Chloride: 100 mmol/L (ref 98–111)
Creatinine, Ser: 0.73 mg/dL (ref 0.44–1.00)
GFR, Estimated: >60 mL/min (ref >60)
Glucose, Bld: 103 mg/dL — ABNORMAL HIGH (ref 70–99)
Potassium: 3.9 mmol/L (ref 3.5–5.1)
Sodium: 136 mmol/L (ref 135–145)

## 2023-10-27 MED ORDER — IOHEXOL 350 MG/ML SOLN
100.0000 mL | Freq: Once | INTRAVENOUS | Status: AC | PRN
  Administered 2023-10-27: 100 mL via INTRAVENOUS

## 2023-10-27 MED ORDER — AMLODIPINE BESYLATE 5 MG PO TABS
5.0000 mg | ORAL_TABLET | Freq: Once | ORAL | Status: AC
  Administered 2023-10-27: 5 mg via ORAL
  Filled 2023-10-27: qty 1

## 2023-10-27 MED ORDER — NITROGLYCERIN 2 % TD OINT
1.0000 [in_us] | TOPICAL_OINTMENT | Freq: Four times a day (QID) | TRANSDERMAL | Status: DC
Start: 2023-10-27 — End: 2023-10-28
  Administered 2023-10-27 – 2023-10-28 (×4): 1 [in_us] via TOPICAL
  Filled 2023-10-27 (×4): qty 1

## 2023-10-27 MED ORDER — HYDROCHLOROTHIAZIDE 25 MG PO TABS
25.0000 mg | ORAL_TABLET | Freq: Once | ORAL | Status: AC
  Administered 2023-10-27: 25 mg via ORAL
  Filled 2023-10-27: qty 1

## 2023-10-27 MED ORDER — NITROGLYCERIN 0.4 MG SL SUBL: 0.4000 mg | SUBLINGUAL_TABLET | SUBLINGUAL | Status: DC | PRN

## 2023-10-27 NOTE — ED Triage Notes (Signed)
C/o left sided CP x 2 days. Rads into left arm and back. Denies cardiac hx. Hypertensive in triage. States she has been taking BP meds.

## 2023-10-27 NOTE — ED Provider Notes (Signed)
[place] [facility] AT [facility] Provider Note   CSN: [id number] Arrival date & time: 10/27/23  1156     History {Add pertinent medical, surgical, social history, OB history to HPI:1} Chief Complaint  Patient presents with   Chest Pain    Julia Gutierrez is a 68 y.o. female with PMH as listed below who presents c/o left sided CP x 2-3 days. Radiates into left arm and back.  Has had nausea with no vomiting.  No diaphoresis.  Does not notice that it is worse with exertion.  Denies shortness of breath, lightheadedness or loss of consciousness, trauma to the chest, abdominal pain, fever/chills, cough, flulike symptoms cardiac hx. States she has been taking BP meds as per usual but her blood pressure has been higher lately, up to the 170s and 180s systolic at home. Does not smoke.  Past Medical History:  Diagnosis Date   Acute cystitis without hematuria 08/28/2021   Adjustment reaction with anxiety and depression 05/23/2022   Allergy 2002   Anxiety and depression 04/13/2021   Anxiety and depression 04/13/2021   Cervical spondylosis 05/04/2021   Cervicogenic headache 04/13/2021   Chronic fatigue 04/13/2021   Depression    Encounter for annual physical exam 04/13/2021   Encounter to establish care 04/13/2021   Enlarged glands 06/18/2021   Heart murmur    Hypertension    Hypothyroidism    IBS (irritable bowel syndrome)    Increased thirst 04/13/2021   Mouth dryness 04/13/2021   Palpitations 04/13/2021   Stroke [facility]) Oct 2023   Vertigo 04/13/2021       Home Medications Prior to Admission medications   Medication Sig Start Date End Date Taking? Authorizing Provider  acetaminophen (TYLENOL) 325 MG tablet Take 650 mg by mouth every 6 (six) hours as needed.    [provider]  amLODipine-valsartan (EXFORGE) 5-160 MG tablet Take 1 tablet by mouth daily. 10/02/23   Tollie Eth, NP  aspirin 325 MG tablet Take 325 mg by mouth daily.    [provider]  clindamycin (CLEOCIN) 300 MG capsule Take 1 capsule (300 mg total) by mouth 3 (three) times daily. 10/02/23   Tollie Eth, NP  fluconazole (DIFLUCAN) 150 MG tablet Take 1 tablet (150 mg total) by mouth every 3 (three) days as needed. 10/20/23   [name], PA-C  gabapentin (NEURONTIN) 100 MG capsule Take 1 capsule (100 mg total) by mouth 2 (two) times daily AND 3 capsules (300 mg total) at bedtime. Use as needed for neuropathic pain. 07/16/23   Tollie Eth, NP  Omega-3 Fatty Acids (FISH OIL PO) Take by mouth.    [provider]  pilocarpine (SALAGEN) 5 MG tablet Take 1 tablet (5 mg total) by mouth 2 (two) times daily. 10/02/23   Tollie Eth, NP  traMADol (ULTRAM) 50 MG tablet Take 1 tablet (50 mg total) by mouth every 8 (eight) hours as needed. 10/02/23   Tollie Eth, NP  Vitamin D, Ergocalciferol, (DRISDOL) 1.25 MG (50000 UNIT) CAPS capsule Take 1 capsule (50,000 Units total) by mouth every 7 (seven) days. 04/01/23   Tollie Eth, NP      Allergies    Promethazine    Review of Systems   Review of Systems A 10 point review of systems was performed and is negative unless otherwise reported in HPI.  Physical Exam Updated Vital Signs BP (!) 164/83   Pulse 69   Temp 98 F (36.7 C)  Resp 14   Ht 5\' 2"  (1.575 m)   Wt 83 kg   SpO2 100%   BMI 33.47 kg/m  Physical Exam General: Normal appearing female, lying in bed.  HEENT: PERRLA, Sclera anicteric, MMM, trachea midline.  Cardiology: RRR, no murmurs/rubs/gallops. BL radial and DP pulses equal bilaterally.  Resp: Normal respiratory rate and effort. CTAB, no wheezes, rhonchi, crackles.  Abd: Soft, non-tender, non-distended. No rebound tenderness or guarding.  GU: Deferred. MSK: No peripheral edema or signs of trauma. Extremities without deformity or TTP. No cyanosis or clubbing. Skin: warm, dry. No rashes or lesions. Back: No CVA tenderness Neuro: A&Ox4, CNs II-XII grossly intact. MAEs. Sensation  grossly intact.  Psych: Normal mood and affect.   ED Results / Procedures / Treatments   Labs (all labs ordered are listed, but only abnormal results are displayed) Labs Reviewed  BASIC METABOLIC PANEL - Abnormal; Notable for the following components:      Result Value   Glucose, Bld 103 (*)    All other components within normal limits  CBC - Abnormal; Notable for the following components:   WBC 13.4 (*)    RDW 15.7 (*)    Platelets 411 (*)    All other components within normal limits  TROPONIN I (HIGH SENSITIVITY) - Abnormal; Notable for the following components:   Troponin I (High Sensitivity) 278 (*)    All other components within normal limits  TROPONIN I (HIGH SENSITIVITY)    EKG EKG Interpretation Date/Time:  Monday October 27 2023 12:10:00 EST Ventricular Rate:  77 PR Interval:  164 QRS Duration:  76 QT Interval:  378 QTC Calculation: 427 R Axis:   32  Text Interpretation: Normal sinus rhythm Normal ECG When compared with ECG of 05-Aug-2022 13:18, No significant change was found Confirmed by Vivi Barrack [phone]) on 10/27/2023 1:03:48 PM  Radiology DG Chest 2 View Result Date: 10/27/2023 CLINICAL DATA:  Chest pain. EXAM: CHEST - 2 VIEW COMPARISON:  Chest radiograph dated October 28, 2022. FINDINGS: Stable cardiomediastinal silhouette. Aortic atherosclerosis. Mild right basilar linear scarring, unchanged. No focal consolidation, pleural effusion, or pneumothorax. No acute osseous abnormality. IMPRESSION: No acute cardiopulmonary findings. Electronically Signed   By: Hart Robinsons M.D.   On: 10/27/2023 13:24    Procedures .Critical Care  Performed by: Loetta Rough, MD Authorized by: Loetta Rough, MD   Critical care provider statement:    Critical care time (minutes):  35   Critical care was necessary to treat or prevent imminent or life-threatening deterioration of the following conditions:  Cardiac failure   Critical care was time spent personally by me on  the following activities:  Development of treatment plan with patient or surrogate, discussions with consultants, evaluation of patient's response to treatment, examination of patient, ordering and review of laboratory studies, ordering and review of radiographic studies, ordering and performing treatments and interventions, pulse oximetry, re-evaluation of patient's condition, review of old charts and obtaining history from patient or surrogate   Care discussed with: accepting provider at another facility     {Document cardiac monitor, telemetry assessment procedure when appropriate:1}    EMERGENCY DEPARTMENT [place] CARDIAC EXAM "Study: Limited Ultrasound of the Heart and Pericardium"  INDICATIONS:Abnormal vital signs and Chest pain Multiple views of the heart and pericardium were obtained in real-time with a multi-frequency probe.  PERFORMED SA:YTKZSW IMAGES ARCHIVED?: Yes LIMITATIONS:  None VIEWS USED: Parasternal long axis, Parasternal short axis, and Apical 4 chamber  INTERPRETATION: Cardiac activity present, Pericardial effusioin absent,  Cardiac tamponade absent, Normal contractility, and possible LVH. No mcconnell's sign or D sign.     EMERGENCY DEPARTMENT ULTRASOUND  Study: Limited Retroperitoneal Ultrasound of the Abdominal Aorta.  INDICATIONS:Abnormal vital signs, Back pain, and Age>55 Multiple views of the abdominal aorta were obtained in real-time from the diaphragmatic hiatus to the aortic bifurcation in transverse planes with a multi-frequency probe.  PERFORMED BY: Myself IMAGES ARCHIVED?: Yes LIMITATIONS:  None INTERPRETATION:  No abdominal aortic aneurysm and no e/o abdominal aortic dissection. No widened thoracic aortic outlet. Not able to visualize aortic arch well on suprasternal view.    Medications Ordered in ED Medications  iohexol (OMNIPAQUE) 350 MG/ML injection 100 mL (100 mLs Intravenous Contrast Given 10/27/23 1329)    ED Course/ Medical Decision Making/  A&P                          Medical Decision Making Amount and/or Complexity of Data Reviewed Labs: ordered. Decision-making details documented in ED Course. Radiology: ordered. Decision-making details documented in ED Course.  Risk Prescription drug management.    This patient presents to the ED for concern of chest pain, this involves an extensive number of treatment options, and is a complaint that carries with it a high risk of complications and morbidity.  I considered the following differential and admission for this acute, potentially life threatening condition.   MDM:    DDX for chest pain includes but is not limited to:  Hypertensive emergency, aortic dissection or acute aortic syndrome, or ACS. Patient has 3-10 chest pain on arrival.  Her pressure in triage is 213/99.  Consider hypertensive emergency, however without any intervention patient's blood pressure decreases to 164/83.  It is possible that this value was erroneous but will avoid giving nitroglycerin in case it was real, do not want to drop her pressure any further.  Patient cannot PERC out based on age. Will obtain CTA to evaluate for dissection as well as PE.    Clinical Course as of 10/27/23 1358  Mon Oct 27, 2023  1304 Troponin I (High Sensitivity)(!!): 278 [HN]  1305 BP(!): 213/99 [HN]  1346 BP(!): 164/83 BP improved without intervention [HN]  1354 CT Angio Chest/Abd/Pel for Dissection W and/or Wo Contrast No dissection on my interpretation. [HN]  1355 Consulted to cardiology. [HN]    Clinical Course User Index [HN] Loetta Rough, MD    Labs: I Ordered, and personally interpreted labs.  The pertinent results include:  those listed aobve  Imaging Studies ordered: I ordered imaging studies including CTA dissection protocol I independently visualized and interpreted imaging. I agree with the radiologist interpretation  Additional history obtained from chart review.    Cardiac Monitoring: The  patient was maintained on a cardiac monitor.  I personally viewed and interpreted the cardiac monitored which showed an underlying rhythm of: NSR  Reevaluation: After the interventions noted above, I reevaluated the patient and found that they have :improved  Social Determinants of Health: Lives independently  Disposition:  ***  Co morbidities that complicate the patient evaluation  Past Medical History:  Diagnosis Date   Acute cystitis without hematuria 08/28/2021   Adjustment reaction with anxiety and depression 05/23/2022   Allergy 2002   Anxiety and depression 04/13/2021   Anxiety and depression 04/13/2021   Cervical spondylosis 05/04/2021   Cervicogenic headache 04/13/2021   Chronic fatigue 04/13/2021   Depression    Encounter for annual physical exam 04/13/2021   Encounter to establish  care 04/13/2021   Enlarged glands 06/18/2021   Heart murmur    Hypertension    Hypothyroidism    IBS (irritable bowel syndrome)    Increased thirst 04/13/2021   Mouth dryness 04/13/2021   Palpitations 04/13/2021   Stroke (HCC) Oct 2023   Vertigo 04/13/2021     Medicines Meds ordered this encounter  Medications   DISCONTD: nitroGLYCERIN (NITROSTAT) SL tablet 0.4 mg   iohexol (OMNIPAQUE) 350 MG/ML injection 100 mL    I have reviewed the patients home medicines and have made adjustments as needed  Problem List / ED Course: Problem List Items Addressed This Visit   None        {Document critical care time when appropriate:1} {Document review of labs and clinical decision tools ie heart score, Chads2Vasc2 etc:1}  {Document your independent review of radiology images, and any outside records:1} {Document your discussion with family members, caretakers, and with consultants:1} {Document social determinants of health affecting pt's care:1} {Document your decision making why or why not admission, treatments were needed:1}  This note was created using dictation software, which  may contain spelling or grammatical errors.

## 2023-10-27 NOTE — ED Notes (Signed)
Spoke with Maisie Fus at [facility] for inpatient cardiology

## 2023-10-27 NOTE — Hospital Course (Signed)
On arrival to the [facility] ED, pt was RSR, 77, no acute ST/T changes.  She was hypertensive on arrival @ 213/99.  Renal function and lytes nl.  WBC 13.4, H/H 13.0/39.8.  HsTroponins 278  296.  CXR w/o acute cardiopulmonary findings.  CTA chest neg for PE, dissection, or aneurysm.  Cor Ca2+ noted.  Small hiatal hernia w/ some slight esophageal wall thickening.  Small juxtapleural lung nodules noted w/ rec for 3-6 month f/u.  Central mesenteric haziness w/ some prominent nodes.

## 2023-10-27 NOTE — ED Notes (Signed)
Report given to the next RN.Marland KitchenMarland Kitchen

## 2023-10-28 ENCOUNTER — Encounter (HOSPITAL_COMMUNITY)
Admission: EM | Disposition: A | Discharge status: Home / Self Care | Payer: Self-pay | Source: Home / Self Care | Attending: Emergency Medicine

## 2023-10-28 DIAGNOSIS — I160 Hypertensive urgency: Secondary | ICD-10-CM | POA: Diagnosis not present

## 2023-10-28 DIAGNOSIS — I2510 Atherosclerotic heart disease of native coronary artery without angina pectoris: Secondary | ICD-10-CM | POA: Diagnosis not present

## 2023-10-28 DIAGNOSIS — Z7982 Long term (current) use of aspirin: Secondary | ICD-10-CM | POA: Diagnosis not present

## 2023-10-28 DIAGNOSIS — R918 Other nonspecific abnormal finding of lung field: Secondary | ICD-10-CM | POA: Diagnosis not present

## 2023-10-28 DIAGNOSIS — I2584 Coronary atherosclerosis due to calcified coronary lesion: Secondary | ICD-10-CM | POA: Diagnosis not present

## 2023-10-28 DIAGNOSIS — Z79899 Other long term (current) drug therapy: Secondary | ICD-10-CM | POA: Diagnosis not present

## 2023-10-28 DIAGNOSIS — I10 Essential (primary) hypertension: Secondary | ICD-10-CM | POA: Diagnosis not present

## 2023-10-28 DIAGNOSIS — Z8673 Personal history of transient ischemic attack (TIA), and cerebral infarction without residual deficits: Secondary | ICD-10-CM | POA: Diagnosis not present

## 2023-10-28 DIAGNOSIS — R5382 Chronic fatigue, unspecified: Secondary | ICD-10-CM | POA: Diagnosis not present

## 2023-10-28 DIAGNOSIS — F32A Depression, unspecified: Secondary | ICD-10-CM | POA: Diagnosis not present

## 2023-10-28 DIAGNOSIS — K449 Diaphragmatic hernia without obstruction or gangrene: Secondary | ICD-10-CM | POA: Diagnosis not present

## 2023-10-28 DIAGNOSIS — F419 Anxiety disorder, unspecified: Secondary | ICD-10-CM | POA: Diagnosis not present

## 2023-10-28 DIAGNOSIS — I214 Non-ST elevation (NSTEMI) myocardial infarction: Secondary | ICD-10-CM

## 2023-10-28 DIAGNOSIS — I350 Nonrheumatic aortic (valve) stenosis: Secondary | ICD-10-CM | POA: Diagnosis not present

## 2023-10-28 DIAGNOSIS — Z7902 Long term (current) use of antithrombotics/antiplatelets: Secondary | ICD-10-CM | POA: Diagnosis not present

## 2023-10-28 DIAGNOSIS — E039 Hypothyroidism, unspecified: Secondary | ICD-10-CM | POA: Diagnosis not present

## 2023-10-28 DIAGNOSIS — E876 Hypokalemia: Secondary | ICD-10-CM | POA: Diagnosis not present

## 2023-10-28 DIAGNOSIS — K5730 Diverticulosis of large intestine without perforation or abscess without bleeding: Secondary | ICD-10-CM | POA: Diagnosis not present

## 2023-10-28 DIAGNOSIS — R079 Chest pain, unspecified: Secondary | ICD-10-CM | POA: Diagnosis present

## 2023-10-28 HISTORY — PX: LEFT HEART CATH AND CORONARY ANGIOGRAPHY: CATH118249

## 2023-10-28 SURGERY — LEFT HEART CATH AND CORONARY ANGIOGRAPHY
Anesthesia: LOCAL

## 2023-10-28 MED ORDER — ONDANSETRON HCL 4 MG/2ML IJ SOLN
4.0000 mg | Freq: Four times a day (QID) | INTRAMUSCULAR | Status: DC | PRN
  Administered 2023-10-28: 4 mg via INTRAVENOUS
  Filled 2023-10-28: qty 2

## 2023-10-28 MED ORDER — CLOPIDOGREL BISULFATE 75 MG PO TABS
300.0000 mg | ORAL_TABLET | Freq: Once | ORAL | Status: AC
  Administered 2023-10-28: 300 mg via ORAL
  Filled 2023-10-28: qty 4

## 2023-10-28 MED ORDER — CARVEDILOL 3.125 MG PO TABS
3.1250 mg | ORAL_TABLET | Freq: Two times a day (BID) | ORAL | Status: DC
  Administered 2023-10-28 – 2023-10-29 (×2): 3.125 mg via ORAL
  Filled 2023-10-28 (×2): qty 1

## 2023-10-28 MED ORDER — BISACODYL 5 MG PO TBEC: 5.0000 mg | DELAYED_RELEASE_TABLET | Freq: Every day | ORAL | Status: DC | PRN

## 2023-10-28 MED ORDER — LABETALOL HCL 5 MG/ML IV SOLN: 10.0000 mg | INTRAVENOUS | Status: AC | PRN

## 2023-10-28 MED ORDER — HEPARIN SODIUM (PORCINE) 1000 UNIT/ML IJ SOLN
INTRAMUSCULAR | Status: AC
  Filled 2023-10-28: qty 10

## 2023-10-28 MED ORDER — ORAL CARE MOUTH RINSE: 15.0000 mL | OROMUCOSAL | Status: DC | PRN

## 2023-10-28 MED ORDER — ACETAMINOPHEN 325 MG PO TABS
650.0000 mg | ORAL_TABLET | Freq: Once | ORAL | Status: AC
  Administered 2023-10-28: 650 mg via ORAL
  Filled 2023-10-28: qty 2

## 2023-10-28 MED ORDER — SODIUM CHLORIDE 0.9 % WEIGHT BASED INFUSION: 3.0000 mL/kg/h | INTRAVENOUS | Status: DC

## 2023-10-28 MED ORDER — ALBUTEROL SULFATE (2.5 MG/3ML) 0.083% IN NEBU: 2.5000 mg | INHALATION_SOLUTION | Freq: Four times a day (QID) | RESPIRATORY_TRACT | Status: DC | PRN

## 2023-10-28 MED ORDER — ASPIRIN 81 MG PO TBEC
81.0000 mg | DELAYED_RELEASE_TABLET | Freq: Every day | ORAL | Status: DC
  Administered 2023-10-29: 81 mg via ORAL
  Filled 2023-10-28: qty 1

## 2023-10-28 MED ORDER — SODIUM CHLORIDE 0.9% FLUSH: 3.0000 mL | INTRAVENOUS | Status: DC | PRN

## 2023-10-28 MED ORDER — VERAPAMIL HCL 2.5 MG/ML IV SOLN
INTRAVENOUS | Status: AC
  Filled 2023-10-28: qty 2

## 2023-10-28 MED ORDER — SODIUM CHLORIDE 0.9 % WEIGHT BASED INFUSION: 1.0000 mL/kg/h | INTRAVENOUS | Status: DC

## 2023-10-28 MED ORDER — ACETAMINOPHEN 325 MG PO TABS
650.0000 mg | ORAL_TABLET | Freq: Four times a day (QID) | ORAL | Status: DC | PRN
  Administered 2023-10-28: 650 mg via ORAL
  Filled 2023-10-28: qty 2

## 2023-10-28 MED ORDER — VERAPAMIL HCL 2.5 MG/ML IV SOLN
INTRAVENOUS | Status: DC | PRN
  Administered 2023-10-28: 10 mL via INTRA_ARTERIAL

## 2023-10-28 MED ORDER — GABAPENTIN 100 MG PO CAPS
100.0000 mg | ORAL_CAPSULE | Freq: Two times a day (BID) | ORAL | Status: DC
  Administered 2023-10-28: 100 mg via ORAL
  Filled 2023-10-28 (×2): qty 1

## 2023-10-28 MED ORDER — SODIUM CHLORIDE 0.9 % IV SOLN: 250.0000 mL | INTRAVENOUS | Status: DC | PRN

## 2023-10-28 MED ORDER — HEPARIN BOLUS VIA INFUSION
4000.0000 [IU] | Freq: Once | INTRAVENOUS | Status: AC
  Administered 2023-10-28: 4000 [IU] via INTRAVENOUS

## 2023-10-28 MED ORDER — PROCHLORPERAZINE EDISYLATE 10 MG/2ML IJ SOLN
10.0000 mg | Freq: Four times a day (QID) | INTRAMUSCULAR | Status: DC | PRN
  Administered 2023-10-28: 10 mg via INTRAVENOUS
  Filled 2023-10-28: qty 2

## 2023-10-28 MED ORDER — ASPIRIN 325 MG PO TABS
325.0000 mg | ORAL_TABLET | Freq: Every day | ORAL | Status: DC
Start: 2023-10-28 — End: 2023-10-28
  Administered 2023-10-28: 325 mg via ORAL
  Filled 2023-10-28: qty 1

## 2023-10-28 MED ORDER — HEPARIN (PORCINE) 25000 UT/250ML-% IV SOLN
850.0000 [IU]/h | INTRAVENOUS | Status: DC
  Administered 2023-10-28: 850 [IU]/h via INTRAVENOUS
  Filled 2023-10-28: qty 250

## 2023-10-28 MED ORDER — ATORVASTATIN CALCIUM 80 MG PO TABS
80.0000 mg | ORAL_TABLET | Freq: Every day | ORAL | Status: DC
  Administered 2023-10-28 – 2023-10-29 (×2): 80 mg via ORAL
  Filled 2023-10-28 (×2): qty 1

## 2023-10-28 MED ORDER — ACETAMINOPHEN 325 MG PO TABS: 650.0000 mg | ORAL_TABLET | Freq: Four times a day (QID) | ORAL | Status: DC | PRN

## 2023-10-28 MED ORDER — FENTANYL CITRATE (PF) 100 MCG/2ML IJ SOLN
INTRAMUSCULAR | Status: AC
  Filled 2023-10-28: qty 2

## 2023-10-28 MED ORDER — LIDOCAINE HCL (PF) 1 % IJ SOLN
INTRAMUSCULAR | Status: AC
  Filled 2023-10-28: qty 30

## 2023-10-28 MED ORDER — FENTANYL CITRATE (PF) 100 MCG/2ML IJ SOLN
INTRAMUSCULAR | Status: DC | PRN
  Administered 2023-10-28: 50 ug via INTRAVENOUS

## 2023-10-28 MED ORDER — CLOPIDOGREL BISULFATE 75 MG PO TABS
75.0000 mg | ORAL_TABLET | Freq: Every day | ORAL | Status: DC
  Administered 2023-10-29: 75 mg via ORAL
  Filled 2023-10-28: qty 1

## 2023-10-28 MED ORDER — HEPARIN SODIUM (PORCINE) 1000 UNIT/ML IJ SOLN
INTRAMUSCULAR | Status: DC | PRN
  Administered 2023-10-28: 4000 [IU] via INTRAVENOUS

## 2023-10-28 MED ORDER — SODIUM CHLORIDE 0.9 % IV SOLN: INTRAVENOUS | Status: AC

## 2023-10-28 MED ORDER — MIDAZOLAM HCL 2 MG/2ML IJ SOLN
INTRAMUSCULAR | Status: DC | PRN
  Administered 2023-10-28: 2 mg via INTRAVENOUS

## 2023-10-28 MED ORDER — HYDRALAZINE HCL 20 MG/ML IJ SOLN: 10.0000 mg | Freq: Four times a day (QID) | INTRAMUSCULAR | Status: DC | PRN

## 2023-10-28 MED ORDER — SODIUM CHLORIDE 0.9% FLUSH
3.0000 mL | Freq: Two times a day (BID) | INTRAVENOUS | Status: DC
  Administered 2023-10-29: 3 mL via INTRAVENOUS

## 2023-10-28 MED ORDER — HYDRALAZINE HCL 20 MG/ML IJ SOLN: 10.0000 mg | INTRAMUSCULAR | Status: AC | PRN

## 2023-10-28 MED ORDER — IOHEXOL 350 MG/ML SOLN
INTRAVENOUS | Status: DC | PRN
  Administered 2023-10-28: 106 mL

## 2023-10-28 MED ORDER — POLYETHYLENE GLYCOL 3350 17 G PO PACK: 17.0000 g | PACK | Freq: Every day | ORAL | Status: DC | PRN

## 2023-10-28 MED ORDER — MIDAZOLAM HCL 2 MG/2ML IJ SOLN
INTRAMUSCULAR | Status: AC
  Filled 2023-10-28: qty 2

## 2023-10-28 MED ORDER — HEPARIN (PORCINE) IN NACL 1000-0.9 UT/500ML-% IV SOLN
INTRAVENOUS | Status: DC | PRN
  Administered 2023-10-28 (×2): 500 mL

## 2023-10-28 MED ORDER — ACETAMINOPHEN 650 MG RE SUPP: 650.0000 mg | Freq: Four times a day (QID) | RECTAL | Status: DC | PRN

## 2023-10-28 MED ORDER — LIDOCAINE HCL (PF) 1 % IJ SOLN
INTRAMUSCULAR | Status: DC | PRN
  Administered 2023-10-28: 2 mL via INTRADERMAL

## 2023-10-28 MED ORDER — GABAPENTIN 300 MG PO CAPS
300.0000 mg | ORAL_CAPSULE | Freq: Every day | ORAL | Status: DC
  Administered 2023-10-28: 300 mg via ORAL
  Filled 2023-10-28: qty 1

## 2023-10-28 MED ORDER — ISOSORBIDE MONONITRATE ER 30 MG PO TB24
30.0000 mg | ORAL_TABLET | Freq: Every day | ORAL | Status: DC
  Administered 2023-10-28 – 2023-10-29 (×2): 30 mg via ORAL
  Filled 2023-10-28 (×2): qty 1

## 2023-10-28 MED ORDER — ALUM & MAG HYDROXIDE-SIMETH 200-200-20 MG/5ML PO SUSP
30.0000 mL | Freq: Once | ORAL | Status: AC
Start: 2023-10-28 — End: 2023-10-28
  Administered 2023-10-28: 30 mL via ORAL
  Filled 2023-10-28: qty 30

## 2023-10-28 MED ORDER — AMLODIPINE BESYLATE 5 MG PO TABS
5.0000 mg | ORAL_TABLET | Freq: Every day | ORAL | Status: DC
  Administered 2023-10-28: 5 mg via ORAL
  Filled 2023-10-28: qty 1

## 2023-10-28 SURGICAL SUPPLY — 13 items
CATH 5FR JL3.5 JR4 ANG PIG MP (CATHETERS) IMPLANT
CATH INFINITI 5 FR 3DRC (CATHETERS) IMPLANT
CATH INFINITI 5 FR AR1 MOD (CATHETERS) IMPLANT
CATH INFINITI 5FR AL1 (CATHETERS) IMPLANT
DEVICE RAD COMP TR BAND LRG (VASCULAR PRODUCTS) IMPLANT
GLIDESHEATH SLEND SS 6F .021 (SHEATH) IMPLANT
GUIDEWIRE INQWIRE 1.5J.035X260 (WIRE) IMPLANT
INQWIRE 1.5J .035X260CM (WIRE) ×1
PACK CARDIAC CATHETERIZATION (CUSTOM PROCEDURE TRAY) ×1 IMPLANT
PROTECTION STATION PRESSURIZED (MISCELLANEOUS) ×1
SET ATX-X65L (MISCELLANEOUS) IMPLANT
SHEATH 6FR 75 DEST SLENDER (SHEATH) IMPLANT
STATION PROTECTION PRESSURIZED (MISCELLANEOUS) IMPLANT

## 2023-10-28 NOTE — ED Provider Notes (Addendum)
Still no inpatient beds available.  Will do ED to ED transfer.  Pt accepted by Dr. Theresia Lo.  Given tylenol for a headache and GI cocktail for some acid reflux.  Currently CP free.  Given the admission for NSTEMI, will also start heparin.    Rolan Bucco, MD 10/28/23 2536    Rolan Bucco, MD 10/28/23 [phone]

## 2023-10-28 NOTE — ED Notes (Addendum)
Secure chat report sent to [facility] and Quentin Angst. They confirm report. No questions noted.

## 2023-10-28 NOTE — Progress Notes (Signed)
PHARMACY - ANTICOAGULATION CONSULT NOTE  Pharmacy Consult for heparin Indication: chest pain/ACS  Allergies  Allergen Reactions   Promethazine Other (See Comments)    Muscle spasms Other reaction(s): Other (See Comments) Muscle spasms Muscle spasms    Patient Measurements: Height: 5\' 2"  (157.5 cm) Weight: 83 kg (182 lb 15.7 oz) IBW/kg (Calculated) : 50.1 Heparin Dosing Weight: 68.7kg  Vital Signs: Temp: 97.9 F (36.6 C) (01/20 2300) Temp Source: Oral (01/20 2300) BP: 122/74 (01/21 0700) Pulse Rate: 77 (01/21 0700)  Labs: Recent Labs    10/27/23 1217 10/27/23 1412  HGB 13.0  --   HCT 39.8  --   PLT 411*  --   CREATININE 0.73  --   TROPONINIHS 278* 296*    Estimated Creatinine Clearance: 66.3 mL/min (by C-G formula based on SCr of 0.73 mg/dL).   Medical History: Past Medical History:  Diagnosis Date   Acute cystitis without hematuria 08/28/2021   Adjustment reaction with anxiety and depression 05/23/2022   Allergy 2002   Anxiety and depression 04/13/2021   Anxiety and depression 04/13/2021   Cervical spondylosis 05/04/2021   Cervicogenic headache 04/13/2021   Chronic fatigue 04/13/2021   Depression    Encounter for annual physical exam 04/13/2021   Encounter to establish care 04/13/2021   Enlarged glands 06/18/2021   Heart murmur    Hypertension    Hypothyroidism    IBS (irritable bowel syndrome)    Increased thirst 04/13/2021   Mouth dryness 04/13/2021   NSTEMI (non-ST elevated myocardial infarction) (HCC) 10/27/2023   Palpitations 04/13/2021   Stroke (HCC) Oct 2023   Vertigo 04/13/2021    Medications:  Infusions:   heparin      Assessment: 63 yof presented to the ED with CP. Troponin elevated and now starting IV heparin. Baseline CBC is WNL. No bleeding noted and she is not on anticoagulation PTA.   Goal of Therapy:  Heparin level 0.3-0.7 units/ml Monitor platelets by anticoagulation protocol: Yes   Plan:  Heparin bolus 4000 units IV x  1 Heparin gtt 850 units/hr Check a 6 hr heparin level Daily heparin level and CBC  Deonte Otting, Drake Leach 10/28/2023,7:55 AM

## 2023-10-28 NOTE — H&P (View-Only) (Signed)
Cardiology Consultation   Patient ID: [name] MRN: [id number]; DOB: 05-02-1956  Admit date: 10/27/2023 Date of Consult: 10/28/2023  PCP:  Tollie Eth, NP   [facility] Providers Cardiologist:  Nanetta Batty, MD  -- New this admission    Patient Profile:   Julia Gutierrez is a 68 y.o. female with a hx of HTN, palpitations, hypothyroidism, history of CVA, anxiety, depression, chronic fatigue who is being seen 10/28/2023 for the evaluation of chest pain and elevated troponin at the request of Dr. Pola Corn   History of Present Illness:   Julia Gutierrez is a 68 year old female with above medical history. Per chart review, patient does not have any past cardiac history and does not follow with a cardiologist. Previously underwent echocardiogram in 06/2022 for evaluation aver CVA. Echocardiogram showed EF 60-65%, no regional wall motion abnormalities, mild AS, mild MR.   Patient presented to the ED on 10/27/23 for evaluation of chest pain that radiated into left arm and back. Initial vital signs in the ED showed BP 213/99, oxygen 100% on room air, HR 74 BPM. Labs in the ED showed K 3.9, creatinine 0.73, WBC 13.4, hemoglobin 13.4, platelets 411. Hstn [id number]. CXRs showed no acute cardiopulmonary findings. CTA chest/abd/pelv showed scattered atherosclerotic plaque, no dissection or aneurysm formation, coronary artery calcifications, small hiatal hernia, juxtapleural small lung nodules. Recommended repeat noncontrast CT in 3-6 months. Also  noted to have central mesenteric haziness with some prominent nodes. Recommended follow up in 6 months.   Patient was transferred to [facility] and admitted to the internal medicine service for treatment of hypertensive emergency. Cardiology asked to consult for chest pain, elevated troponins.   On interview, patient denies having any past cardiac history.  Reports that in 2019, she was told she had a few mini strokes and underwent some "heart tests" that  were all reportedly normal.  She has a history of hypertension and has had some adjustments to her BP medications recently.  Reports being told she has high cholesterol, but did not need to start medications and is trying to manage with lifestyle changes.  Reports a significant family history of coronary artery disease.  Both of her parents had CAD.  She also has multiple siblings who have had CAD/MIs in their 67s.  Denies tobacco, alcohol, drug use.  Patient reports that she had been on amlodipine/hydrochlorothiazide and her blood pressure was well-controlled.  However, she developed low potassium so her PCP switched her to amlodipine/valsartan.  This was about 1 week ago.  Patient noticed that after changing medications, her blood pressure started to run high at home, into the 170s-180s systolic.  On Friday 1/17, she went back to taking her hydrochlorothiazide and her blood pressure improved somewhat.  She continued to have high blood pressure over the weekend, and took additional doses of her amlodipine, hydrochlorothiazide intermittently.  Notes that she also had chest tightness that has been going on for the past 2-3 days.  Chest tightness seems to worsen when her blood pressure is elevated and improves when her blood pressure gets better controlled.  Occasionally, she also notices that the pain radiates into her neck, left arm, and back.  She denies worsening symptoms on exertion.  Notes that she is pretty active in her day-to-day life.  She continues to work, she also works in her yard and goes for walks frequently.  Before this weekend, she denies having significant chest pain or dyspnea on exertion.  Currently, patient is having some  nausea because she has not eaten.  Also has a headache.  Admits to having an intermittent headache over the weekend, now has a different kind of headache that started when she was started on nitroglycerin.  Reports having nasal congestion and headache on 1/18, felt like she  was developing a sinus infection.  Denies sore throat, cough, fever.  Past Medical History:  Diagnosis Date   Acute cystitis without hematuria 08/28/2021   Adjustment reaction with anxiety and depression 05/23/2022   Allergy 2002   Anxiety and depression 04/13/2021   Anxiety and depression 04/13/2021   Cervical spondylosis 05/04/2021   Cervicogenic headache 04/13/2021   Chronic fatigue 04/13/2021   Depression    Encounter for annual physical exam 04/13/2021   Encounter to establish care 04/13/2021   Enlarged glands 06/18/2021   Heart murmur    Hypertension    Hypothyroidism    IBS (irritable bowel syndrome)    Increased thirst 04/13/2021   Mouth dryness 04/13/2021   NSTEMI (non-ST elevated myocardial infarction) (HCC) 10/27/2023   Palpitations 04/13/2021   Stroke [facility]) Oct 2023   Vertigo 04/13/2021    Past Surgical History:  Procedure Laterality Date   CHOLECYSTECTOMY       Home Medications:  Prior to Admission medications   Medication Sig Start Date End Date Taking? Authorizing Provider  acetaminophen (TYLENOL) 500 MG tablet Take 1,000 mg by mouth 2 (two) times daily as needed for moderate pain (pain score 4-6), fever or headache.   Yes [provider]  amLODipine (NORVASC) 5 MG tablet Take 5 mg by mouth at bedtime.   Yes [provider]  aspirin 325 MG tablet Take 325-650 mg by mouth See admin instructions. Take 1 tablet (325mg ) daily. May take an additional 1 tablet if not feeling well.   Yes [provider]  gabapentin (NEURONTIN) 100 MG capsule Take 1 capsule (100 mg total) by mouth 2 (two) times daily AND 3 capsules (300 mg total) at bedtime. Use as needed for neuropathic pain. Patient taking differently: Take 2-3 capsules (200-300mg ) by mouth at bedtime. 07/16/23  Yes Early, Sung Amabile, NP  hydrochlorothiazide (HYDRODIURIL) 25 MG tablet Take 25 mg by mouth at bedtime.   Yes [provider]  ibuprofen (ADVIL) 200 MG tablet Take 400 mg by  mouth 2 (two) times daily as needed for moderate pain (pain score 4-6), headache or fever.   Yes [provider]  POTASSIUM PO Take 1 tablet by mouth daily. OTC potassium   Yes [provider]  Vitamin D, Ergocalciferol, (DRISDOL) 1.25 MG (50000 UNIT) CAPS capsule Take 1 capsule (50,000 Units total) by mouth every 7 (seven) days. Patient taking differently: Take 50,000 Units by mouth every Sunday. 04/01/23  Yes Early, Sung Amabile, NP  amLODipine-valsartan (EXFORGE) 5-160 MG tablet Take 1 tablet by mouth daily. Patient not taking: Reported on 10/28/2023 10/02/23   Early, Sung Amabile, NP  clindamycin (CLEOCIN) 300 MG capsule Take 1 capsule (300 mg total) by mouth 3 (three) times daily. Patient not taking: Reported on 10/28/2023 10/02/23   Early, Sung Amabile, NP  fluconazole (DIFLUCAN) 150 MG tablet Take 1 tablet (150 mg total) by mouth every 3 (three) days as needed. Patient not taking: Reported on 10/28/2023 10/20/23   [name], PA-C  pilocarpine (SALAGEN) 5 MG tablet Take 1 tablet (5 mg total) by mouth 2 (two) times daily. Patient not taking: Reported on 10/28/2023 10/02/23   Tollie Eth, NP  traMADol (ULTRAM) 50 MG tablet Take 1  tablet (50 mg total) by mouth every 8 (eight) hours as needed. Patient not taking: Reported on 10/28/2023 10/02/23   Tollie Eth, NP    Inpatient Medications: Scheduled Meds:  aspirin  325 mg Oral Daily   nitroGLYCERIN  1 inch Topical Q6H   Continuous Infusions:  heparin 850 Units/hr (10/28/23 0803)   PRN Meds:   Allergies:    Allergies  Allergen Reactions   Promethazine Other (See Comments)    Muscle spasms     Social History:   Social History   Socioeconomic History   Marital status: Divorced    Spouse name: Not on file   Number of children: 3   Years of education: 18   Highest education level: Master's degree (e.g., MA, MS, MEng, MEd, MSW, MBA)  Occupational History   Occupation: Holiday representative  Tobacco Use   Smoking status: Never     Passive exposure: Never   Smokeless tobacco: Never  Vaping Use   Vaping status: Never Used  Substance and Sexual Activity   Alcohol use: Never    Comment: Glass of wine once a year   Drug use: Never   Sexual activity: Not Currently    Birth control/protection: Abstinence  Other Topics Concern   Not on file  Social History Narrative   Right handed   Caffeine 3 cups a day tea and energy drinks   Lives alone one level home   An apartment with her dog   Social Drivers of Corporate investment banker Strain: Not on file  Food Insecurity: Not on file  Transportation Needs: Not on file  Physical Activity: Not on file  Stress: Not on file  Social Connections: Not on file  Intimate Partner Violence: Not on file    Family History:    Family History  Problem Relation Age of Onset   Diabetes Mother    Hypertension Mother    Heart disease Mother    Arthritis Mother    Obesity Mother    Heart disease Father    Hypertension Father    Stroke Sister    Heart disease Sister    Hypertension Sister    Kidney failure Sister    Alcohol abuse Sister    Drug abuse Sister    Stroke Brother    Heart disease Brother    Hypertension Brother    Alcohol abuse Brother    Depression Brother    Drug abuse Brother    Early death Brother    Obesity Brother    Heart disease Brother    Hypertension Brother    Alcohol abuse Brother    Drug abuse Brother    Stroke Brother      ROS:  Please see the history of present illness.   All other ROS reviewed and negative.     Physical Exam/Data:   Vitals:   10/28/23 0830 10/28/23 0900 10/28/23 0915 10/28/23 1000  BP:  (!) 145/81  113/83  Pulse:  88 74 79  Resp: 16 (!) 22 19 15   Temp:      TempSrc:      SpO2:  100% 98% 99%  Weight:      Height:       No intake or output data in the 24 hours ending 10/28/23 1205    10/27/2023   12:11 PM 10/02/2023    3:54 PM 07/03/2023    3:59 PM  Last 3 Weights  Weight (lbs) 182 lb 15.7 oz 183 lb  6.4 oz 184  lb 12.8 oz  Weight (kg) 83 kg 83.19 kg 83.825 kg     Body mass index is 33.47 kg/m.  General:  Elderly female. Sitting upright in the bed. Well nourished, well developed, in no acute distress.  HEENT: normal Neck: no JVD Vascular: No carotid bruits; Radial  pulses 2+ bilaterally Cardiac:  normal S1, S2; RRR; grade 3/6 systolic murmur throughout. Murmur radiates to the carotid arteries  Lungs:  clear to auscultation bilaterally, no wheezing, rhonchi or rales. Normal WOB on room air  Abd: soft, nontender  Ext: no edema in BLE  Musculoskeletal:  No deformities  Skin: warm and dry  Neuro:  no focal abnormalities noted Psych:  Normal affect   EKG:  The EKG was personally reviewed and demonstrates:  EKG showed normal sinus rhythm, heart rate 77 bpm.  No ischemic changes. Telemetry:  Telemetry was personally reviewed and demonstrates: Normal sinus rhythm  Relevant CV Studies:   Laboratory Data:  High Sensitivity Troponin:   Recent Labs  Lab 10/27/23 1217 10/27/23 1412  TROPONINIHS 278* 296*     Chemistry Recent Labs  Lab 10/27/23 1217  NA 136  K 3.9  CL 100  CO2 27  GLUCOSE 103*  BUN 20  CREATININE 0.73  CALCIUM 9.8  GFRNONAA >60  ANIONGAP 9    No results for input(s): "PROT", "ALBUMIN", "AST", "ALT", "ALKPHOS", "BILITOT" in the last 168 hours. Lipids No results for input(s): "CHOL", "TRIG", "HDL", "LABVLDL", "LDLCALC", "CHOLHDL" in the last 168 hours.  Hematology Recent Labs  Lab 10/27/23 1217  WBC 13.4*  RBC 4.80  HGB 13.0  HCT 39.8  MCV 82.9  MCH 27.1  MCHC 32.7  RDW 15.7*  PLT 411*   Thyroid No results for input(s): "TSH", "FREET4" in the last 168 hours.  BNPNo results for input(s): "BNP", "PROBNP" in the last 168 hours.  DDimer No results for input(s): "DDIMER" in the last 168 hours.   Radiology/Studies:  CT Angio Chest/Abd/Pel for Dissection W and/or Wo Contrast Result Date: 10/27/2023 CLINICAL DATA:  Left-sided chest pain for 2 days  with radiation to the left arm and back. Hypertensive EXAM: CT ANGIOGRAPHY CHEST, ABDOMEN AND PELVIS TECHNIQUE: Non-contrast CT of the chest was initially obtained. Multidetector CT imaging through the chest, abdomen and pelvis was performed using the standard protocol during bolus administration of intravenous contrast. Multiplanar reconstructed images and MIPs were obtained and reviewed to evaluate the vascular anatomy. RADIATION DOSE REDUCTION: This exam was performed according to the departmental dose-optimization program which includes automated exposure control, adjustment of the mA and/or kV according to patient size and/or use of iterative reconstruction technique. CONTRAST:  OMNIPAQUE IOHEXOL 350 MG/ML SOLN COMPARISON:  Chest x-ray 10/27/2023 and older. CTA head and neck 06/12/2022 FINDINGS: CTA CHEST FINDINGS Cardiovascular: Thoracic aorta is without COVID there are high density on the noncontrast dataset. There are scattered vascular calcifications along the thoracic aorta. Mild pulsation artifact. Diameter of the ascending aorta at the level of the right pulmonary artery measures 3.7 3.8 cm. The descending thoracic aorta at the same level measures 2.5 by 2.5 cm. Diameter of the aortic root approaches 2.8 cm. Distal aortic arch has a diameter of 2.4 cm. Scattered atherosclerotic calcified plaque. There is some mild plaque along the origin of the great vessels as well. Small amount of calcification as well in the area of the aortic valve. Coronary artery calcifications are seen. The heart is nonenlarged. No pericardial effusion. Mediastinum/Nodes: No specific abnormal lymph node enlargement identified in the  axillary region, hilum or mediastinum. A few small less than 1 cm size nodes are seen in the mediastinum and hilum, nonpathologic by size criteria. Small hiatal hernia. Slight wall thickening along the esophagus. Please correlate with symptoms. The thyroid gland is preserved. Lungs/Pleura: No  consolidation, pneumothorax or effusion. There is some linear opacity along bases likely scar or atelectasis. There is noncalcified juxtapleural nodule in the superior segment of the left lower lobe posteriorly on series 7, image 84 measuring 6 mm. Similar focus right lower lobe measures 7 mm on series 7, image 89. Musculoskeletal: Osteopenia. Scattered degenerative changes along the spine with multilevel Schmorl's node deformities. Chronic appearing deformity along lateral lower left ribs. Review of the MIP images confirms the above findings. CTA ABDOMEN AND PELVIS FINDINGS VASCULAR Aorta: Scattered atherosclerotic calcified plaque identified along the aorta without aneurysm formation or dissection. Celiac: Patent without evidence of aneurysm, dissection, vasculitis or significant stenosis. SMA: Patent without evidence of aneurysm, dissection, vasculitis or significant stenosis. Mild atherosclerotic plaque. Renals: Single bilateral main renal arteries identified. There is some mild calcified plaque along the origin of both main renal arteries. There is of note an early branch of the left renal artery within 1 cm of the ostium. IMA: Patent without evidence of aneurysm, dissection, vasculitis or significant stenosis. Inflow: Mild atherosclerotic calcified plaque seen of the iliac vessels particularly the common iliac arteries without significant stenosis. There is some calcified plaque along the internal iliac arteries. Minimal disease of the external. Veins: No obvious venous abnormality within the limitations of this arterial phase study. Review of the MIP images confirms the above findings. NON-VASCULAR Hepatobiliary: With the limits of the early phase of the arterial bolus, grossly the liver is preserved. Gallbladder is poorly seen. Pancreas: Unremarkable. No pancreatic ductal dilatation or surrounding inflammatory changes. Spleen: Normal in size without focal abnormality. Adrenals/Urinary Tract: Adrenal glands  are unremarkable. Kidneys are normal, without renal calculi, focal lesion, or hydronephrosis. Bladder is unremarkable. Stomach/Bowel: Large bowel has a normal course and caliber with scattered colonic stool. Few sigmoid colon diverticula. Normal appendix. Stomach is underdistended. Lymphatic: There are some prominent central mesenteric nodes identified. These are less than a cm in short axis but much more numerous than usually seen with some associated stranding of the central mesentery. No retroperitoneal or pelvic lymph node enlargement Reproductive: Uterus and bilateral adnexa are unremarkable. Other: No free air or free fluid. Small fat containing umbilical hernia. Musculoskeletal: Curvature of the spine with some degenerative changes. Review of the MIP images confirms the above findings. IMPRESSION: Scattered atherosclerotic calcified plaque. No dissection or aneurysm formation. There is pulsation artifact along the ascending aorta. Coronary artery calcifications. Small hiatal hernia with some slight wall thickening of the esophagus. Please correlate with symptoms and additional evaluation when clinically appropriate. Juxtapleural small lung nodules. These measure up to 7 mm. Non-contrast chest CT at 3-6 months is recommended. If the nodules are stable at time of repeat CT, then future CT at 18-24 months (from today's scan) is considered optional for low-risk patients, but is recommended for high-risk patients. This recommendation follows the consensus statement: Guidelines for Management of Incidental Pulmonary Nodules Detected on CT Images: From the Fleischner Society 2017; Radiology 2017; 284:228-243. Few sigmoid colon diverticula.  No bowel obstruction. Central mesenteric haziness with some prominent nodes. More numerous than usually seen. Please correlate with any prior imaging of the abdomen and pelvis or additional follow up in 6 months to help confirm stability and a benign process. Electronically  Signed  By: Karen Kays M.D.   On: 10/27/2023 14:54   DG Chest 2 View Result Date: 10/27/2023 CLINICAL DATA:  Chest pain. EXAM: CHEST - 2 VIEW COMPARISON:  Chest radiograph dated October 28, 2022. FINDINGS: Stable cardiomediastinal silhouette. Aortic atherosclerosis. Mild right basilar linear scarring, unchanged. No focal consolidation, pleural effusion, or pneumothorax. No acute osseous abnormality. IMPRESSION: No acute cardiopulmonary findings. Electronically Signed   By: Hart Robinsons M.D.   On: 10/27/2023 13:24     Assessment and Plan:   Chest Pain  Elevated Troponins -Patient presented to the ED 1/20 complaining of chest pain.  Reported that chest pain had been intermittent throughout the past 3 days.  Seem to worsen when blood pressure was elevated, improved when blood pressure improved.  Not associated with exertion.  Sometimes radiated into neck, left arm, back -High-sensitivity troponin 278>29. EKG nonischemic  - Patient has multiple risk factors for CAD- has poorly controlled BP, HLD. Also have family history of CAD, and multiple of her siblings had CAD/MI in their 79s. CTA for dissection showed scattered atherosclerotic calcified plaque and coronary calcifications  - Recommend cardiac catheterization for definitive evaluation - Ordered lipid panel, A1c  - Patient has already received ASA 324 mg in the ED. Start ASA 81 mg daily tomorrow  - Start carvedilol 3.125 mg BID  - Start lipitor 80 mg daily  - Ordered echocardiogram   HTN  - BP very elevated on arrival to the ED- 213/99 - Prior to admission, patient was on amlodipine-valsartan 5-160 mg daily. Was previously on amlodipine-hydrochlorothiazide prior. Her BP has been elevated for the past week or so, and she was trying to manage her BP by taking extra doses of her medications - BP now improving- down to 145/81  - Start carvedilol 3.125 mg BID as above  - Continue home amlodipine 5 mg daily     Informed Consent    Shared Decision Making/Informed Consent The risks [stroke (1 in 1000), death (1 in 1000), kidney failure [usually temporary] (1 in 500), bleeding (1 in 200), allergic reaction [possibly serious] (1 in 200)], benefits (diagnostic support and management of coronary artery disease) and alternatives of a cardiac catheterization were discussed in detail with Ms. Shenk and she is willing to proceed.       Risk Assessment/Risk Scores:      For questions or updates, please contact [facility] Please consult [email or web address] for contact info under    Signed, Jonita Albee, PA-C  10/28/2023 12:05 PM  Agree with note by Robet Leu PA-C   68 year old Caucasian female with positive cardiac risk factors including family history and hypertension who we are asked to see because of chest pain and positive enzymes.  She has no prior cardiac history.  She started to feel poorly at the end of last week with elevated blood pressure.  She developed chest pain yesterday with radiation to her neck and arm as well as back.  She is currently pain-free on IV heparin.  Her enzymes are mildly elevated approximately 300.  Her chest CT is unremarkable except for coronary calcification.  EKG shows no acute changes.  Her exam is benign.  Given her family history, symptoms and positive enzymes along with coronary calcification I feel the best strategy would be diagnostic coronary angiography.   I have reviewed the risks, indications, and alternatives to cardiac catheterization, possible angioplasty, and stenting with the patient. Risks include but are not limited to bleeding, infection, vascular injury, stroke, myocardial infection, arrhythmia,  kidney injury, radiation-related injury in the case of prolonged fluoroscopy use, emergency cardiac surgery, and death. The patient understands the risks of serious complication is 1-2 in 1000 with diagnostic cardiac cath and 1-2% or less with angioplasty/stenting.     Runell Gess, M.D., FACP, [facility], [facility] [address] [place], [place]  [address]  [phone] 10/28/2023 2:07 PM

## 2023-10-28 NOTE — Interval H&P Note (Signed)
History and Physical Interval Note:  10/28/2023 2:11 PM  Julia Gutierrez  has presented today for surgery, with the diagnosis of nstemi.  The various methods of treatment have been discussed with the patient and family. After consideration of risks, benefits and other options for treatment, the patient has consented to  Procedure(s): LEFT HEART CATH AND CORONARY ANGIOGRAPHY (N/A) as a surgical intervention.  The patient's history has been reviewed, patient examined, no change in status, stable for surgery.  I have reviewed the patient's chart and labs.  Questions were answered to the patient's satisfaction.    Cath Lab Visit (complete for each Cath Lab visit)  Clinical Evaluation Leading to the Procedure:   ACS: Yes.    Non-ACS:    Anginal Classification: CCS III  Anti-ischemic medical therapy: Minimal Therapy (1 class of medications)  Non-Invasive Test Results: No non-invasive testing performed  Prior CABG: No previous CABG        Verne Carrow

## 2023-10-28 NOTE — ED Notes (Signed)
Admitting paged. Cardiology into see

## 2023-10-28 NOTE — ED Notes (Signed)
Confirmed heparin drip running at 850units/hr

## 2023-10-28 NOTE — ED Notes (Signed)
Alert, NAD, calm, interactive, resps e/u, speaking clear complete sentences, steady gait. Up to b/r.

## 2023-10-28 NOTE — Consult Note (Addendum)
Cardiology Consultation   Patient ID: [name] MRN: [id number]; DOB: 05-02-1956  Admit date: 10/27/2023 Date of Consult: 10/28/2023  PCP:  Tollie Eth, NP   [facility] Providers Cardiologist:  Nanetta Batty, MD  -- New this admission    Patient Profile:   Julia Gutierrez is a 68 y.o. female with a hx of HTN, palpitations, hypothyroidism, history of CVA, anxiety, depression, chronic fatigue who is being seen 10/28/2023 for the evaluation of chest pain and elevated troponin at the request of Dr. Pola Corn   History of Present Illness:   Julia Gutierrez is a 68 year old female with above medical history. Per chart review, patient does not have any past cardiac history and does not follow with a cardiologist. Previously underwent echocardiogram in 06/2022 for evaluation aver CVA. Echocardiogram showed EF 60-65%, no regional wall motion abnormalities, mild AS, mild MR.   Patient presented to the ED on 10/27/23 for evaluation of chest pain that radiated into left arm and back. Initial vital signs in the ED showed BP 213/99, oxygen 100% on room air, HR 74 BPM. Labs in the ED showed K 3.9, creatinine 0.73, WBC 13.4, hemoglobin 13.4, platelets 411. Hstn [id number]. CXRs showed no acute cardiopulmonary findings. CTA chest/abd/pelv showed scattered atherosclerotic plaque, no dissection or aneurysm formation, coronary artery calcifications, small hiatal hernia, juxtapleural small lung nodules. Recommended repeat noncontrast CT in 3-6 months. Also  noted to have central mesenteric haziness with some prominent nodes. Recommended follow up in 6 months.   Patient was transferred to [facility] and admitted to the internal medicine service for treatment of hypertensive emergency. Cardiology asked to consult for chest pain, elevated troponins.   On interview, patient denies having any past cardiac history.  Reports that in 2019, she was told she had a few mini strokes and underwent some "heart tests" that  were all reportedly normal.  She has a history of hypertension and has had some adjustments to her BP medications recently.  Reports being told she has high cholesterol, but did not need to start medications and is trying to manage with lifestyle changes.  Reports a significant family history of coronary artery disease.  Both of her parents had CAD.  She also has multiple siblings who have had CAD/MIs in their 67s.  Denies tobacco, alcohol, drug use.  Patient reports that she had been on amlodipine/hydrochlorothiazide and her blood pressure was well-controlled.  However, she developed low potassium so her PCP switched her to amlodipine/valsartan.  This was about 1 week ago.  Patient noticed that after changing medications, her blood pressure started to run high at home, into the 170s-180s systolic.  On Friday 1/17, she went back to taking her hydrochlorothiazide and her blood pressure improved somewhat.  She continued to have high blood pressure over the weekend, and took additional doses of her amlodipine, hydrochlorothiazide intermittently.  Notes that she also had chest tightness that has been going on for the past 2-3 days.  Chest tightness seems to worsen when her blood pressure is elevated and improves when her blood pressure gets better controlled.  Occasionally, she also notices that the pain radiates into her neck, left arm, and back.  She denies worsening symptoms on exertion.  Notes that she is pretty active in her day-to-day life.  She continues to work, she also works in her yard and goes for walks frequently.  Before this weekend, she denies having significant chest pain or dyspnea on exertion.  Currently, patient is having some  nausea because she has not eaten.  Also has a headache.  Admits to having an intermittent headache over the weekend, now has a different kind of headache that started when she was started on nitroglycerin.  Reports having nasal congestion and headache on 1/18, felt like she  was developing a sinus infection.  Denies sore throat, cough, fever.  Past Medical History:  Diagnosis Date   Acute cystitis without hematuria 08/28/2021   Adjustment reaction with anxiety and depression 05/23/2022   Allergy 2002   Anxiety and depression 04/13/2021   Anxiety and depression 04/13/2021   Cervical spondylosis 05/04/2021   Cervicogenic headache 04/13/2021   Chronic fatigue 04/13/2021   Depression    Encounter for annual physical exam 04/13/2021   Encounter to establish care 04/13/2021   Enlarged glands 06/18/2021   Heart murmur    Hypertension    Hypothyroidism    IBS (irritable bowel syndrome)    Increased thirst 04/13/2021   Mouth dryness 04/13/2021   NSTEMI (non-ST elevated myocardial infarction) (HCC) 10/27/2023   Palpitations 04/13/2021   Stroke [facility]) Oct 2023   Vertigo 04/13/2021    Past Surgical History:  Procedure Laterality Date   CHOLECYSTECTOMY       Home Medications:  Prior to Admission medications   Medication Sig Start Date End Date Taking? Authorizing Provider  acetaminophen (TYLENOL) 500 MG tablet Take 1,000 mg by mouth 2 (two) times daily as needed for moderate pain (pain score 4-6), fever or headache.   Yes [provider]  amLODipine (NORVASC) 5 MG tablet Take 5 mg by mouth at bedtime.   Yes [provider]  aspirin 325 MG tablet Take 325-650 mg by mouth See admin instructions. Take 1 tablet (325mg ) daily. May take an additional 1 tablet if not feeling well.   Yes [provider]  gabapentin (NEURONTIN) 100 MG capsule Take 1 capsule (100 mg total) by mouth 2 (two) times daily AND 3 capsules (300 mg total) at bedtime. Use as needed for neuropathic pain. Patient taking differently: Take 2-3 capsules (200-300mg ) by mouth at bedtime. 07/16/23  Yes Early, Sung Amabile, NP  hydrochlorothiazide (HYDRODIURIL) 25 MG tablet Take 25 mg by mouth at bedtime.   Yes [provider]  ibuprofen (ADVIL) 200 MG tablet Take 400 mg by  mouth 2 (two) times daily as needed for moderate pain (pain score 4-6), headache or fever.   Yes [provider]  POTASSIUM PO Take 1 tablet by mouth daily. OTC potassium   Yes [provider]  Vitamin D, Ergocalciferol, (DRISDOL) 1.25 MG (50000 UNIT) CAPS capsule Take 1 capsule (50,000 Units total) by mouth every 7 (seven) days. Patient taking differently: Take 50,000 Units by mouth every Sunday. 04/01/23  Yes Early, Sung Amabile, NP  amLODipine-valsartan (EXFORGE) 5-160 MG tablet Take 1 tablet by mouth daily. Patient not taking: Reported on 10/28/2023 10/02/23   Early, Sung Amabile, NP  clindamycin (CLEOCIN) 300 MG capsule Take 1 capsule (300 mg total) by mouth 3 (three) times daily. Patient not taking: Reported on 10/28/2023 10/02/23   Early, Sung Amabile, NP  fluconazole (DIFLUCAN) 150 MG tablet Take 1 tablet (150 mg total) by mouth every 3 (three) days as needed. Patient not taking: Reported on 10/28/2023 10/20/23   [name], PA-C  pilocarpine (SALAGEN) 5 MG tablet Take 1 tablet (5 mg total) by mouth 2 (two) times daily. Patient not taking: Reported on 10/28/2023 10/02/23   Tollie Eth, NP  traMADol (ULTRAM) 50 MG tablet Take 1  tablet (50 mg total) by mouth every 8 (eight) hours as needed. Patient not taking: Reported on 10/28/2023 10/02/23   Tollie Eth, NP    Inpatient Medications: Scheduled Meds:  aspirin  325 mg Oral Daily   nitroGLYCERIN  1 inch Topical Q6H   Continuous Infusions:  heparin 850 Units/hr (10/28/23 0803)   PRN Meds:   Allergies:    Allergies  Allergen Reactions   Promethazine Other (See Comments)    Muscle spasms     Social History:   Social History   Socioeconomic History   Marital status: Divorced    Spouse name: Not on file   Number of children: 3   Years of education: 18   Highest education level: Master's degree (e.g., MA, MS, MEng, MEd, MSW, MBA)  Occupational History   Occupation: Holiday representative  Tobacco Use   Smoking status: Never     Passive exposure: Never   Smokeless tobacco: Never  Vaping Use   Vaping status: Never Used  Substance and Sexual Activity   Alcohol use: Never    Comment: Glass of wine once a year   Drug use: Never   Sexual activity: Not Currently    Birth control/protection: Abstinence  Other Topics Concern   Not on file  Social History Narrative   Right handed   Caffeine 3 cups a day tea and energy drinks   Lives alone one level home   An apartment with her dog   Social Drivers of Corporate investment banker Strain: Not on file  Food Insecurity: Not on file  Transportation Needs: Not on file  Physical Activity: Not on file  Stress: Not on file  Social Connections: Not on file  Intimate Partner Violence: Not on file    Family History:    Family History  Problem Relation Age of Onset   Diabetes Mother    Hypertension Mother    Heart disease Mother    Arthritis Mother    Obesity Mother    Heart disease Father    Hypertension Father    Stroke Sister    Heart disease Sister    Hypertension Sister    Kidney failure Sister    Alcohol abuse Sister    Drug abuse Sister    Stroke Brother    Heart disease Brother    Hypertension Brother    Alcohol abuse Brother    Depression Brother    Drug abuse Brother    Early death Brother    Obesity Brother    Heart disease Brother    Hypertension Brother    Alcohol abuse Brother    Drug abuse Brother    Stroke Brother      ROS:  Please see the history of present illness.   All other ROS reviewed and negative.     Physical Exam/Data:   Vitals:   10/28/23 0830 10/28/23 0900 10/28/23 0915 10/28/23 1000  BP:  (!) 145/81  113/83  Pulse:  88 74 79  Resp: 16 (!) 22 19 15   Temp:      TempSrc:      SpO2:  100% 98% 99%  Weight:      Height:       No intake or output data in the 24 hours ending 10/28/23 1205    10/27/2023   12:11 PM 10/02/2023    3:54 PM 07/03/2023    3:59 PM  Last 3 Weights  Weight (lbs) 182 lb 15.7 oz 183 lb  6.4 oz 184  lb 12.8 oz  Weight (kg) 83 kg 83.19 kg 83.825 kg     Body mass index is 33.47 kg/m.  General:  Elderly female. Sitting upright in the bed. Well nourished, well developed, in no acute distress.  HEENT: normal Neck: no JVD Vascular: No carotid bruits; Radial  pulses 2+ bilaterally Cardiac:  normal S1, S2; RRR; grade 3/6 systolic murmur throughout. Murmur radiates to the carotid arteries  Lungs:  clear to auscultation bilaterally, no wheezing, rhonchi or rales. Normal WOB on room air  Abd: soft, nontender  Ext: no edema in BLE  Musculoskeletal:  No deformities  Skin: warm and dry  Neuro:  no focal abnormalities noted Psych:  Normal affect   EKG:  The EKG was personally reviewed and demonstrates:  EKG showed normal sinus rhythm, heart rate 77 bpm.  No ischemic changes. Telemetry:  Telemetry was personally reviewed and demonstrates: Normal sinus rhythm  Relevant CV Studies:   Laboratory Data:  High Sensitivity Troponin:   Recent Labs  Lab 10/27/23 1217 10/27/23 1412  TROPONINIHS 278* 296*     Chemistry Recent Labs  Lab 10/27/23 1217  NA 136  K 3.9  CL 100  CO2 27  GLUCOSE 103*  BUN 20  CREATININE 0.73  CALCIUM 9.8  GFRNONAA >60  ANIONGAP 9    No results for input(s): "PROT", "ALBUMIN", "AST", "ALT", "ALKPHOS", "BILITOT" in the last 168 hours. Lipids No results for input(s): "CHOL", "TRIG", "HDL", "LABVLDL", "LDLCALC", "CHOLHDL" in the last 168 hours.  Hematology Recent Labs  Lab 10/27/23 1217  WBC 13.4*  RBC 4.80  HGB 13.0  HCT 39.8  MCV 82.9  MCH 27.1  MCHC 32.7  RDW 15.7*  PLT 411*   Thyroid No results for input(s): "TSH", "FREET4" in the last 168 hours.  BNPNo results for input(s): "BNP", "PROBNP" in the last 168 hours.  DDimer No results for input(s): "DDIMER" in the last 168 hours.   Radiology/Studies:  CT Angio Chest/Abd/Pel for Dissection W and/or Wo Contrast Result Date: 10/27/2023 CLINICAL DATA:  Left-sided chest pain for 2 days  with radiation to the left arm and back. Hypertensive EXAM: CT ANGIOGRAPHY CHEST, ABDOMEN AND PELVIS TECHNIQUE: Non-contrast CT of the chest was initially obtained. Multidetector CT imaging through the chest, abdomen and pelvis was performed using the standard protocol during bolus administration of intravenous contrast. Multiplanar reconstructed images and MIPs were obtained and reviewed to evaluate the vascular anatomy. RADIATION DOSE REDUCTION: This exam was performed according to the departmental dose-optimization program which includes automated exposure control, adjustment of the mA and/or kV according to patient size and/or use of iterative reconstruction technique. CONTRAST:  OMNIPAQUE IOHEXOL 350 MG/ML SOLN COMPARISON:  Chest x-ray 10/27/2023 and older. CTA head and neck 06/12/2022 FINDINGS: CTA CHEST FINDINGS Cardiovascular: Thoracic aorta is without COVID there are high density on the noncontrast dataset. There are scattered vascular calcifications along the thoracic aorta. Mild pulsation artifact. Diameter of the ascending aorta at the level of the right pulmonary artery measures 3.7 3.8 cm. The descending thoracic aorta at the same level measures 2.5 by 2.5 cm. Diameter of the aortic root approaches 2.8 cm. Distal aortic arch has a diameter of 2.4 cm. Scattered atherosclerotic calcified plaque. There is some mild plaque along the origin of the great vessels as well. Small amount of calcification as well in the area of the aortic valve. Coronary artery calcifications are seen. The heart is nonenlarged. No pericardial effusion. Mediastinum/Nodes: No specific abnormal lymph node enlargement identified in the  axillary region, hilum or mediastinum. A few small less than 1 cm size nodes are seen in the mediastinum and hilum, nonpathologic by size criteria. Small hiatal hernia. Slight wall thickening along the esophagus. Please correlate with symptoms. The thyroid gland is preserved. Lungs/Pleura: No  consolidation, pneumothorax or effusion. There is some linear opacity along bases likely scar or atelectasis. There is noncalcified juxtapleural nodule in the superior segment of the left lower lobe posteriorly on series 7, image 84 measuring 6 mm. Similar focus right lower lobe measures 7 mm on series 7, image 89. Musculoskeletal: Osteopenia. Scattered degenerative changes along the spine with multilevel Schmorl's node deformities. Chronic appearing deformity along lateral lower left ribs. Review of the MIP images confirms the above findings. CTA ABDOMEN AND PELVIS FINDINGS VASCULAR Aorta: Scattered atherosclerotic calcified plaque identified along the aorta without aneurysm formation or dissection. Celiac: Patent without evidence of aneurysm, dissection, vasculitis or significant stenosis. SMA: Patent without evidence of aneurysm, dissection, vasculitis or significant stenosis. Mild atherosclerotic plaque. Renals: Single bilateral main renal arteries identified. There is some mild calcified plaque along the origin of both main renal arteries. There is of note an early branch of the left renal artery within 1 cm of the ostium. IMA: Patent without evidence of aneurysm, dissection, vasculitis or significant stenosis. Inflow: Mild atherosclerotic calcified plaque seen of the iliac vessels particularly the common iliac arteries without significant stenosis. There is some calcified plaque along the internal iliac arteries. Minimal disease of the external. Veins: No obvious venous abnormality within the limitations of this arterial phase study. Review of the MIP images confirms the above findings. NON-VASCULAR Hepatobiliary: With the limits of the early phase of the arterial bolus, grossly the liver is preserved. Gallbladder is poorly seen. Pancreas: Unremarkable. No pancreatic ductal dilatation or surrounding inflammatory changes. Spleen: Normal in size without focal abnormality. Adrenals/Urinary Tract: Adrenal glands  are unremarkable. Kidneys are normal, without renal calculi, focal lesion, or hydronephrosis. Bladder is unremarkable. Stomach/Bowel: Large bowel has a normal course and caliber with scattered colonic stool. Few sigmoid colon diverticula. Normal appendix. Stomach is underdistended. Lymphatic: There are some prominent central mesenteric nodes identified. These are less than a cm in short axis but much more numerous than usually seen with some associated stranding of the central mesentery. No retroperitoneal or pelvic lymph node enlargement Reproductive: Uterus and bilateral adnexa are unremarkable. Other: No free air or free fluid. Small fat containing umbilical hernia. Musculoskeletal: Curvature of the spine with some degenerative changes. Review of the MIP images confirms the above findings. IMPRESSION: Scattered atherosclerotic calcified plaque. No dissection or aneurysm formation. There is pulsation artifact along the ascending aorta. Coronary artery calcifications. Small hiatal hernia with some slight wall thickening of the esophagus. Please correlate with symptoms and additional evaluation when clinically appropriate. Juxtapleural small lung nodules. These measure up to 7 mm. Non-contrast chest CT at 3-6 months is recommended. If the nodules are stable at time of repeat CT, then future CT at 18-24 months (from today's scan) is considered optional for low-risk patients, but is recommended for high-risk patients. This recommendation follows the consensus statement: Guidelines for Management of Incidental Pulmonary Nodules Detected on CT Images: From the Fleischner Society 2017; Radiology 2017; 284:228-243. Few sigmoid colon diverticula.  No bowel obstruction. Central mesenteric haziness with some prominent nodes. More numerous than usually seen. Please correlate with any prior imaging of the abdomen and pelvis or additional follow up in 6 months to help confirm stability and a benign process. Electronically  Signed  By: Karen Kays M.D.   On: 10/27/2023 14:54   DG Chest 2 View Result Date: 10/27/2023 CLINICAL DATA:  Chest pain. EXAM: CHEST - 2 VIEW COMPARISON:  Chest radiograph dated October 28, 2022. FINDINGS: Stable cardiomediastinal silhouette. Aortic atherosclerosis. Mild right basilar linear scarring, unchanged. No focal consolidation, pleural effusion, or pneumothorax. No acute osseous abnormality. IMPRESSION: No acute cardiopulmonary findings. Electronically Signed   By: Hart Robinsons M.D.   On: 10/27/2023 13:24     Assessment and Plan:   Chest Pain  Elevated Troponins -Patient presented to the ED 1/20 complaining of chest pain.  Reported that chest pain had been intermittent throughout the past 3 days.  Seem to worsen when blood pressure was elevated, improved when blood pressure improved.  Not associated with exertion.  Sometimes radiated into neck, left arm, back -High-sensitivity troponin 278>29. EKG nonischemic  - Patient has multiple risk factors for CAD- has poorly controlled BP, HLD. Also have family history of CAD, and multiple of her siblings had CAD/MI in their 79s. CTA for dissection showed scattered atherosclerotic calcified plaque and coronary calcifications  - Recommend cardiac catheterization for definitive evaluation - Ordered lipid panel, A1c  - Patient has already received ASA 324 mg in the ED. Start ASA 81 mg daily tomorrow  - Start carvedilol 3.125 mg BID  - Start lipitor 80 mg daily  - Ordered echocardiogram   HTN  - BP very elevated on arrival to the ED- 213/99 - Prior to admission, patient was on amlodipine-valsartan 5-160 mg daily. Was previously on amlodipine-hydrochlorothiazide prior. Her BP has been elevated for the past week or so, and she was trying to manage her BP by taking extra doses of her medications - BP now improving- down to 145/81  - Start carvedilol 3.125 mg BID as above  - Continue home amlodipine 5 mg daily     Informed Consent    Shared Decision Making/Informed Consent The risks [stroke (1 in 1000), death (1 in 1000), kidney failure [usually temporary] (1 in 500), bleeding (1 in 200), allergic reaction [possibly serious] (1 in 200)], benefits (diagnostic support and management of coronary artery disease) and alternatives of a cardiac catheterization were discussed in detail with Julia Gutierrez and she is willing to proceed.       Risk Assessment/Risk Scores:      For questions or updates, please contact [facility] Please consult [email or web address] for contact info under    Signed, Jonita Albee, PA-C  10/28/2023 12:05 PM  Agree with note by Robet Leu PA-C   68 year old Caucasian female with positive cardiac risk factors including family history and hypertension who we are asked to see because of chest pain and positive enzymes.  She has no prior cardiac history.  She started to feel poorly at the end of last week with elevated blood pressure.  She developed chest pain yesterday with radiation to her neck and arm as well as back.  She is currently pain-free on IV heparin.  Her enzymes are mildly elevated approximately 300.  Her chest CT is unremarkable except for coronary calcification.  EKG shows no acute changes.  Her exam is benign.  Given her family history, symptoms and positive enzymes along with coronary calcification I feel the best strategy would be diagnostic coronary angiography.   I have reviewed the risks, indications, and alternatives to cardiac catheterization, possible angioplasty, and stenting with the patient. Risks include but are not limited to bleeding, infection, vascular injury, stroke, myocardial infection, arrhythmia,  kidney injury, radiation-related injury in the case of prolonged fluoroscopy use, emergency cardiac surgery, and death. The patient understands the risks of serious complication is 1-2 in 1000 with diagnostic cardiac cath and 1-2% or less with angioplasty/stenting.     Julia Gutierrez, M.D., FACP, [facility], [facility] [address] [place], [place]  [address]  [phone] 10/28/2023 2:07 PM

## 2023-10-28 NOTE — H&P (Signed)
[facility] History and Physical  Julia Gutierrez WUJ:[id number] DOB: 01-19-1956 DOA: 10/27/2023 PCP: Tollie Eth, NP  Presented from: Home Chief Complaint: Chest pain  History of Present Illness: Julia Gutierrez is a 68 y.o. female with PMH significant for HTN, palpitation, hypothyroidism, CVA, anxiety/depression, chronic fatigue 1/20, patient presented to the ED at [facility] [place] for chest pain that radiated to her left arm and back.  In the ED initially, blood pressure was elevated to 213/99, heart rate 74 Troponin elevated to 278> 296 Chest x-ray unremarkable for acute cardiopulmonary findings Cardiology was consulted Patient was accepted to [facility] but patient waited overnight at [facility] ED because of not having a bed available. Patient was transferred to [facility] ED this morning. Seen by cardiology Underwent cardiac cath, no stent needed.  At the time of my evaluation, patient was in the floor postcardiac cath status. Not in distress.  No family at bedside History reviewed and detailed as above. Patient is physically active and works at a storage facility.  Review of Systems:  All systems were reviewed and were negative unless otherwise mentioned in the HPI   Past medical history: Past Medical History:  Diagnosis Date   Acute cystitis without hematuria 08/28/2021   Adjustment reaction with anxiety and depression 05/23/2022   Allergy 2002   Anxiety and depression 04/13/2021   Anxiety and depression 04/13/2021   Cervical spondylosis 05/04/2021   Cervicogenic headache 04/13/2021   Chronic fatigue 04/13/2021   Depression    Encounter for annual physical exam 04/13/2021   Encounter to establish care 04/13/2021   Enlarged glands 06/18/2021   Heart murmur    Hypertension    Hypothyroidism    IBS (irritable bowel syndrome)    Increased thirst 04/13/2021   Mouth dryness 04/13/2021   NSTEMI (non-ST elevated myocardial infarction) (HCC) 10/27/2023   Palpitations  04/13/2021   Stroke [facility]) Oct 2023   Vertigo 04/13/2021    Past surgical history: Past Surgical History:  Procedure Laterality Date   CHOLECYSTECTOMY      Social History:  reports that she has never smoked. She has never been exposed to tobacco smoke. She has never used smokeless tobacco. She reports that she does not drink alcohol and does not use drugs.  Allergies:  Allergies  Allergen Reactions   Promethazine Other (See Comments)    Muscle spasms    Promethazine   Family history:  Family History  Problem Relation Age of Onset   Diabetes Mother    Hypertension Mother    Heart disease Mother    Arthritis Mother    Obesity Mother    Heart disease Father    Hypertension Father    Stroke Sister    Heart disease Sister    Hypertension Sister    Kidney failure Sister    Alcohol abuse Sister    Drug abuse Sister    Stroke Brother    Heart disease Brother    Hypertension Brother    Alcohol abuse Brother    Depression Brother    Drug abuse Brother    Early death Brother    Obesity Brother    Heart disease Brother    Hypertension Brother    Alcohol abuse Brother    Drug abuse Brother    Stroke Brother      Physical Exam: Vitals:   10/28/23 1507 10/28/23 1512 10/28/23 1538 10/28/23 1617  BP: (!) 166/88 (!) 160/94 (!) 157/93   Pulse: 81 (!) 0 77   Resp:  14  16   Temp:   98.3 F (36.8 C)   TempSrc:   Oral   SpO2: 97%  96%   Weight:    79.2 kg  Height:    5\' 2"  (1.575 m)   Wt Readings from Last 3 Encounters:  10/28/23 79.2 kg  10/02/23 83.2 kg  07/03/23 83.8 kg   Body mass index is 31.94 kg/m.  General exam: Pleasant, elderly. Skin: No rashes, lesions or ulcers. HEENT: Atraumatic, normocephalic, no obvious bleeding Lungs: Clear to auscultation bilaterally,  CVS: S1, S2, mild systolic murmur,  GI/Abd: Soft, nontender, nondistended, bowel sound present,   CNS: Alert, awake, oriented x 3 Psychiatry: Mood appropriate,  Extremities: No pedal edema,  no calf tenderness,    ----------------------------------------------------------------------------------------------------------------------------------------- ----------------------------------------------------------------------------------------------------------------------------------------- -----------------------------------------------------------------------------------------------------------------------------------------  Assessment/Plan: Principal Problem:   NSTEMI (non-ST elevated myocardial infarction) (HCC)  NSTEMI Troponin was elevated as below.  In the setting of significantly elevated blood pressure Cardiac cath done today showed multifocal nonsignificant stenosis without flow limitation.  No stent was deployed Medical management recommended Recent Labs    10/27/23 1217 10/27/23 1412  TROPONINIHS 278* 296*   Hypertensive urgency Blood pressure was initially elevated over 200.  Gradually improved. Patient reports she used to have good blood pressure control with amlodipine and HCTZ.  1 week ago, PCP noted low potassium and HCTZ was switched to valsartan.  Patient has noticed blood pressure running elevated in 170s with the new regimen. Cardiology has started the patient on carvedilol 3.125 mg twice daily.  Continue amlodipine 5 mg daily  H/o CVA HLD PTA meds-not on statin??  Obtain lipid panel  Hypothyroidism ??  Not on Synthroid.  Obtain TSH  anxiety/depression  chronic fatigue On gabapentin nightly  Mobility: Encourage ambulation  Goals of care:   Code Status: Full Code    DVT prophylaxis: Heparin drip    Antimicrobials: None Fluid: Per cardiac cath protocol Consultants: Cardiology Family Communication: None at bedside  Dispo: The patient is from: Home              Anticipated d/c is to: Hopefully home tomorrow  Diet: Diet Order             Diet Heart Room service appropriate? Yes; Fluid consistency: Thin  Diet effective now                     ------------------------------------------------------------------------------------- Severity of Illness: The appropriate patient status for this patient is OBSERVATION. Observation status is judged to be reasonable and necessary in order to provide the required intensity of service to ensure the patient's safety. The patient's presenting symptoms, physical exam findings, and initial radiographic and laboratory data in the context of their medical condition is felt to place them at decreased risk for further clinical deterioration. Furthermore, it is anticipated that the patient will be medically stable for discharge from the hospital within 2 midnights of admission.  -------------------------------------------------------------------------------------   Home Meds: Prior to Admission medications   Medication Sig Start Date End Date Taking? Authorizing Provider  acetaminophen (TYLENOL) 500 MG tablet Take 1,000 mg by mouth 2 (two) times daily as needed for moderate pain (pain score 4-6), fever or headache.   Yes [provider]  amLODipine (NORVASC) 5 MG tablet Take 5 mg by mouth at bedtime.   Yes [provider]  aspirin 325 MG tablet Take 325-650 mg by mouth See admin instructions. Take 1 tablet (325mg ) daily. May take an additional 1 tablet if not feeling well.  Yes [provider]  gabapentin (NEURONTIN) 100 MG capsule Take 1 capsule (100 mg total) by mouth 2 (two) times daily AND 3 capsules (300 mg total) at bedtime. Use as needed for neuropathic pain. Patient taking differently: Take 2-3 capsules (200-300mg ) by mouth at bedtime. 07/16/23  Yes Early, Sung Amabile, NP  hydrochlorothiazide (HYDRODIURIL) 25 MG tablet Take 25 mg by mouth at bedtime.   Yes [provider]  ibuprofen (ADVIL) 200 MG tablet Take 400 mg by mouth 2 (two) times daily as needed for moderate pain (pain score 4-6), headache or fever.   Yes [provider]  POTASSIUM PO Take  1 tablet by mouth daily. OTC potassium   Yes [provider]  Vitamin D, Ergocalciferol, (DRISDOL) 1.25 MG (50000 UNIT) CAPS capsule Take 1 capsule (50,000 Units total) by mouth every 7 (seven) days. Patient taking differently: Take 50,000 Units by mouth every Sunday. 04/01/23  Yes Early, Sung Amabile, NP  amLODipine-valsartan (EXFORGE) 5-160 MG tablet Take 1 tablet by mouth daily. Patient not taking: Reported on 10/28/2023 10/02/23   Early, Sung Amabile, NP  clindamycin (CLEOCIN) 300 MG capsule Take 1 capsule (300 mg total) by mouth 3 (three) times daily. Patient not taking: Reported on 10/28/2023 10/02/23   Early, Sung Amabile, NP  fluconazole (DIFLUCAN) 150 MG tablet Take 1 tablet (150 mg total) by mouth every 3 (three) days as needed. Patient not taking: Reported on 10/28/2023 10/20/23   [name], PA-C  pilocarpine (SALAGEN) 5 MG tablet Take 1 tablet (5 mg total) by mouth 2 (two) times daily. Patient not taking: Reported on 10/28/2023 10/02/23   Early, Sung Amabile, NP  traMADol (ULTRAM) 50 MG tablet Take 1 tablet (50 mg total) by mouth every 8 (eight) hours as needed. Patient not taking: Reported on 10/28/2023 10/02/23   Tollie Eth, NP    Labs on Admission:   CBC: Recent Labs  Lab 10/27/23 1217  WBC 13.4*  HGB 13.0  HCT 39.8  MCV 82.9  PLT 411*    Basic Metabolic Panel: Recent Labs  Lab 10/27/23 1217  NA 136  K 3.9  CL 100  CO2 27  GLUCOSE 103*  BUN 20  CREATININE 0.73  CALCIUM 9.8    Liver Function Tests: No results for input(s): "AST", "ALT", "ALKPHOS", "BILITOT", "PROT", "ALBUMIN" in the last 168 hours. No results for input(s): "LIPASE", "AMYLASE" in the last 168 hours. No results for input(s): "AMMONIA" in the last 168 hours.  Cardiac Enzymes: No results for input(s): "CKTOTAL", "CKMB", "CKMBINDEX", "TROPONINI" in the last 168 hours.  BNP (last 3 results) No results for input(s): "BNP" in the last 8760 hours.  ProBNP (last 3 results) No results for input(s):  "PROBNP" in the last 8760 hours.  CBG: No results for input(s): "GLUCAP" in the last 168 hours.  Lipase  No results found for: "LIPASE"   Urinalysis    Component Value Date/Time   BILIRUBINUR negative 12/04/2021 0921   KETONESUR negative 12/04/2021 0921   UROBILINOGEN 0.2 12/04/2021 0921   NITRITE Negative 12/04/2021 0921   LEUKOCYTESUR Negative 12/04/2021 2423     Drugs of Abuse  No results found for: "LABOPIA", "COCAINSCRNUR", "LABBENZ", "AMPHETMU", "THCU", "LABBARB"    Radiological Exams on Admission: CARDIAC CATHETERIZATION Addendum Date: 10/28/2023   Prox RCA to Mid RCA lesion is 20% stenosed.   Dist RCA lesion is 60% stenosed.   RPAV lesion is 70% stenosed.   1st Mrg lesion is 40% stenosed.   Prox Cx to Mid  Cx lesion is 60% stenosed.   Ost Cx to Prox Cx lesion is 30% stenosed.   Mid LAD lesion is 20% stenosed. Large caliber LAD that courses to the apex. Mild plaque in the mid vessel Moderate caliber Circumflex with moderate caliber obtuse marginal branch. Mild to moderate non-obstructive disease in the obtuse marginal branch. Moderate disease in the continuation of the small caliber AV groove Circumflex. Large dominant RCA with heavy calcification throughout the proximal through distal vessel. Moderate stenosis mid vessel (60% with heavy calcification). Normal LV systolic function LVEDP=9 mmHg. Recommendations: She has no focal targets for PCI. There is a moderately stenotic, heavily calcified lesion in the mid to distal RCA. This does not appear to be flow limiting. Her presentation is most likely consistent with demand ischemia in the setting of elevated blood pressure and moderate underlying CAD. I will add Plavix and Imdur. Titration of medications as needed for BP control prior to discharge. If she has recurrent angina despite optimal medical therapy, could consider PCI of the RCA with orbital atherectomy. I would plan right femoral access for her next procedure.   Result Date:  10/28/2023   Prox RCA to Mid RCA lesion is 20% stenosed.   Dist RCA lesion is 60% stenosed.   RPAV lesion is 70% stenosed.   1st Mrg lesion is 40% stenosed.   Prox Cx to Mid Cx lesion is 60% stenosed.   Ost Cx to Prox Cx lesion is 30% stenosed.   Mid LAD lesion is 20% stenosed. Large caliber LAD that courses to the apex. Mild plaque in the mid vessel Moderate caliber Circumflex with moderate caliber obtuse marginal branch. Mild to moderate non-obstructive disease in the obtuse marginal branch. Moderate disease in the continuation of the small caliber AV groove Circumflex. Large dominant RCA with heavy calcification throughout the proximal through distal vessel. Moderate stenosis mid vessel (60% with heavy calcification). Normal LV systolic function LVEDP=9 mmHg. Recommendations: She has no focal targets for PCI. There is a moderately stenotic, heavily calcified lesion in the mid to distal RCA. This does not appear to be flow limiting. Her presentation is most likely consistent with demand ischemia in the setting of elevated blood pressure and moderate underlying CAD. I will add Plavix and Imdur. Titration of medications as needed for BP control prior to discharge.   CT Angio Chest/Abd/Pel for Dissection W and/or Wo Contrast Result Date: 10/27/2023 CLINICAL DATA:  Left-sided chest pain for 2 days with radiation to the left arm and back. Hypertensive EXAM: CT ANGIOGRAPHY CHEST, ABDOMEN AND PELVIS TECHNIQUE: Non-contrast CT of the chest was initially obtained. Multidetector CT imaging through the chest, abdomen and pelvis was performed using the standard protocol during bolus administration of intravenous contrast. Multiplanar reconstructed images and MIPs were obtained and reviewed to evaluate the vascular anatomy. RADIATION DOSE REDUCTION: This exam was performed according to the departmental dose-optimization program which includes automated exposure control, adjustment of the mA and/or kV according to patient  size and/or use of iterative reconstruction technique. CONTRAST:  OMNIPAQUE IOHEXOL 350 MG/ML SOLN COMPARISON:  Chest x-ray 10/27/2023 and older. CTA head and neck 06/12/2022 FINDINGS: CTA CHEST FINDINGS Cardiovascular: Thoracic aorta is without COVID there are high density on the noncontrast dataset. There are scattered vascular calcifications along the thoracic aorta. Mild pulsation artifact. Diameter of the ascending aorta at the level of the right pulmonary artery measures 3.7 3.8 cm. The descending thoracic aorta at the same level measures 2.5 by 2.5 cm. Diameter of the aortic  root approaches 2.8 cm. Distal aortic arch has a diameter of 2.4 cm. Scattered atherosclerotic calcified plaque. There is some mild plaque along the origin of the great vessels as well. Small amount of calcification as well in the area of the aortic valve. Coronary artery calcifications are seen. The heart is nonenlarged. No pericardial effusion. Mediastinum/Nodes: No specific abnormal lymph node enlargement identified in the axillary region, hilum or mediastinum. A few small less than 1 cm size nodes are seen in the mediastinum and hilum, nonpathologic by size criteria. Small hiatal hernia. Slight wall thickening along the esophagus. Please correlate with symptoms. The thyroid gland is preserved. Lungs/Pleura: No consolidation, pneumothorax or effusion. There is some linear opacity along bases likely scar or atelectasis. There is noncalcified juxtapleural nodule in the superior segment of the left lower lobe posteriorly on series 7, image 84 measuring 6 mm. Similar focus right lower lobe measures 7 mm on series 7, image 89. Musculoskeletal: Osteopenia. Scattered degenerative changes along the spine with multilevel Schmorl's node deformities. Chronic appearing deformity along lateral lower left ribs. Review of the MIP images confirms the above findings. CTA ABDOMEN AND PELVIS FINDINGS VASCULAR Aorta: Scattered atherosclerotic  calcified plaque identified along the aorta without aneurysm formation or dissection. Celiac: Patent without evidence of aneurysm, dissection, vasculitis or significant stenosis. SMA: Patent without evidence of aneurysm, dissection, vasculitis or significant stenosis. Mild atherosclerotic plaque. Renals: Single bilateral main renal arteries identified. There is some mild calcified plaque along the origin of both main renal arteries. There is of note an early branch of the left renal artery within 1 cm of the ostium. IMA: Patent without evidence of aneurysm, dissection, vasculitis or significant stenosis. Inflow: Mild atherosclerotic calcified plaque seen of the iliac vessels particularly the common iliac arteries without significant stenosis. There is some calcified plaque along the internal iliac arteries. Minimal disease of the external. Veins: No obvious venous abnormality within the limitations of this arterial phase study. Review of the MIP images confirms the above findings. NON-VASCULAR Hepatobiliary: With the limits of the early phase of the arterial bolus, grossly the liver is preserved. Gallbladder is poorly seen. Pancreas: Unremarkable. No pancreatic ductal dilatation or surrounding inflammatory changes. Spleen: Normal in size without focal abnormality. Adrenals/Urinary Tract: Adrenal glands are unremarkable. Kidneys are normal, without renal calculi, focal lesion, or hydronephrosis. Bladder is unremarkable. Stomach/Bowel: Large bowel has a normal course and caliber with scattered colonic stool. Few sigmoid colon diverticula. Normal appendix. Stomach is underdistended. Lymphatic: There are some prominent central mesenteric nodes identified. These are less than a cm in short axis but much more numerous than usually seen with some associated stranding of the central mesentery. No retroperitoneal or pelvic lymph node enlargement Reproductive: Uterus and bilateral adnexa are unremarkable. Other: No free air  or free fluid. Small fat containing umbilical hernia. Musculoskeletal: Curvature of the spine with some degenerative changes. Review of the MIP images confirms the above findings. IMPRESSION: Scattered atherosclerotic calcified plaque. No dissection or aneurysm formation. There is pulsation artifact along the ascending aorta. Coronary artery calcifications. Small hiatal hernia with some slight wall thickening of the esophagus. Please correlate with symptoms and additional evaluation when clinically appropriate. Juxtapleural small lung nodules. These measure up to 7 mm. Non-contrast chest CT at 3-6 months is recommended. If the nodules are stable at time of repeat CT, then future CT at 18-24 months (from today's scan) is considered optional for low-risk patients, but is recommended for high-risk patients. This recommendation follows the consensus statement: Guidelines for  Management of Incidental Pulmonary Nodules Detected on CT Images: From the Fleischner Society 2017; Radiology 2017; 284:228-243. Few sigmoid colon diverticula.  No bowel obstruction. Central mesenteric haziness with some prominent nodes. More numerous than usually seen. Please correlate with any prior imaging of the abdomen and pelvis or additional follow up in 6 months to help confirm stability and a benign process. Electronically Signed   By: Karen Kays M.D.   On: 10/27/2023 14:54   DG Chest 2 View Result Date: 10/27/2023 CLINICAL DATA:  Chest pain. EXAM: CHEST - 2 VIEW COMPARISON:  Chest radiograph dated October 28, 2022. FINDINGS: Stable cardiomediastinal silhouette. Aortic atherosclerosis. Mild right basilar linear scarring, unchanged. No focal consolidation, pleural effusion, or pneumothorax. No acute osseous abnormality. IMPRESSION: No acute cardiopulmonary findings. Electronically Signed   By: Hart Robinsons M.D.   On: 10/27/2023 13:24     Signed, Lorin Glass, MD [facility] 10/28/2023

## 2023-10-28 NOTE — Plan of Care (Signed)
  Problem: Activity: Goal: Ability to return to baseline activity level will improve Outcome: Progressing   Problem: Education: Goal: Knowledge of General Education information will improve Description: Including pain rating scale, medication(s)/side effects and non-pharmacologic comfort measures Outcome: Progressing   

## 2023-10-28 NOTE — Progress Notes (Signed)
TR band removed, no bleeding noted, no hematoma noted. Sterile transparent dressing applied.

## 2023-10-29 ENCOUNTER — Observation Stay (HOSPITAL_COMMUNITY): Payer: Medicare Other

## 2023-10-29 ENCOUNTER — Encounter (HOSPITAL_COMMUNITY): Payer: Self-pay | Admitting: Cardiovascular Disease

## 2023-10-29 ENCOUNTER — Other Ambulatory Visit (HOSPITAL_COMMUNITY): Payer: Self-pay

## 2023-10-29 DIAGNOSIS — R079 Chest pain, unspecified: Secondary | ICD-10-CM

## 2023-10-29 DIAGNOSIS — I214 Non-ST elevation (NSTEMI) myocardial infarction: Secondary | ICD-10-CM | POA: Diagnosis not present

## 2023-10-29 DIAGNOSIS — I10 Essential (primary) hypertension: Secondary | ICD-10-CM

## 2023-10-29 LAB — BASIC METABOLIC PANEL
Anion gap: 6 (ref 5–15)
BUN: 12 mg/dL (ref 8–23)
CO2: 27 mmol/L (ref 22–32)
Calcium: 8.8 mg/dL — ABNORMAL LOW (ref 8.9–10.3)
Chloride: 105 mmol/L (ref 98–111)
Creatinine, Ser: 0.77 mg/dL (ref 0.44–1.00)
GFR, Estimated: >60 mL/min (ref >60)
Glucose, Bld: 118 mg/dL — ABNORMAL HIGH (ref 70–99)
Potassium: 3.2 mmol/L — ABNORMAL LOW (ref 3.5–5.1)
Sodium: 138 mmol/L (ref 135–145)

## 2023-10-29 LAB — ECHOCARDIOGRAM COMPLETE
AR max vel: 1.15 cm2
AV Area VTI: 1.12 cm2
AV Area mean vel: 1.12 cm2
AV Mean grad: 9.5 mm[Hg]
AV Peak grad: 18.9 mm[Hg]
Ao pk vel: 2.18 m/s
Area-P 1/2: 2.39 cm2
Height: 62 in
S' Lateral: 2.7 cm
Weight: 2772.5 [oz_av]

## 2023-10-29 LAB — LIPID PANEL
Cholesterol: 174 mg/dL (ref 0–200)
HDL: 45 mg/dL (ref >40)
LDL Cholesterol: 109 mg/dL — ABNORMAL HIGH (ref 0–99)
Total CHOL/HDL Ratio: 3.9 {ratio}
Triglycerides: 98 mg/dL (ref <150)
VLDL: 20 mg/dL (ref 0–40)

## 2023-10-29 LAB — CBC
HCT: 38.1 % (ref 36.0–46.0)
Hemoglobin: 12.4 g/dL (ref 12.0–15.0)
MCH: 27.1 pg (ref 26.0–34.0)
MCHC: 32.5 g/dL (ref 30.0–36.0)
MCV: 83.4 fL (ref 80.0–100.0)
Platelets: 387 10*3/uL (ref 150–400)
RBC: 4.57 MIL/uL (ref 3.87–5.11)
RDW: 15.6 % — ABNORMAL HIGH (ref 11.5–15.5)
WBC: 11.7 10*3/uL — ABNORMAL HIGH (ref 4.0–10.5)
nRBC: 0 % (ref 0.0–0.2)

## 2023-10-29 LAB — TSH: TSH: 5.103 u[IU]/mL — ABNORMAL HIGH (ref 0.350–4.500)

## 2023-10-29 LAB — T4, FREE: Free T4: 1.04 ng/dL (ref 0.61–1.12)

## 2023-10-29 LAB — HIV ANTIBODY (ROUTINE TESTING W REFLEX): HIV Screen 4th Generation wRfx: NONREACTIVE

## 2023-10-29 LAB — HEMOGLOBIN A1C
Hgb A1c MFr Bld: 5.8 % — ABNORMAL HIGH (ref 4.8–5.6)
Mean Plasma Glucose: 119.76 mg/dL

## 2023-10-29 MED ORDER — LOSARTAN POTASSIUM 25 MG PO TABS
25.0000 mg | ORAL_TABLET | Freq: Every day | ORAL | 0 refills | Status: DC
  Filled 2023-10-29: qty 90, 90d supply, fill #0

## 2023-10-29 MED ORDER — LOSARTAN POTASSIUM 25 MG PO TABS
25.0000 mg | ORAL_TABLET | Freq: Every day | ORAL | Status: DC
  Administered 2023-10-29: 25 mg via ORAL
  Filled 2023-10-29: qty 1

## 2023-10-29 MED ORDER — CARVEDILOL 3.125 MG PO TABS
3.1250 mg | ORAL_TABLET | Freq: Two times a day (BID) | ORAL | 0 refills | Status: DC
  Filled 2023-10-29: qty 180, 90d supply, fill #0

## 2023-10-29 MED ORDER — AMLODIPINE BESYLATE 5 MG PO TABS
5.0000 mg | ORAL_TABLET | Freq: Every day | ORAL | 0 refills | Status: DC
  Filled 2023-10-29 – 2023-12-26 (×2): qty 90, 90d supply, fill #0

## 2023-10-29 MED ORDER — POTASSIUM CHLORIDE CRYS ER 20 MEQ PO TBCR
40.0000 meq | EXTENDED_RELEASE_TABLET | Freq: Once | ORAL | Status: AC
  Administered 2023-10-29: 40 meq via ORAL
  Filled 2023-10-29: qty 2

## 2023-10-29 MED ORDER — ISOSORBIDE MONONITRATE ER 30 MG PO TB24
30.0000 mg | ORAL_TABLET | Freq: Every day | ORAL | 0 refills | Status: DC
  Filled 2023-10-29: qty 90, 90d supply, fill #0

## 2023-10-29 MED ORDER — ASPIRIN 81 MG PO TBEC
81.0000 mg | DELAYED_RELEASE_TABLET | Freq: Every day | ORAL | 0 refills | Status: AC
  Filled 2023-10-29: qty 90, 90d supply, fill #0

## 2023-10-29 MED ORDER — ATORVASTATIN CALCIUM 80 MG PO TABS
80.0000 mg | ORAL_TABLET | Freq: Every day | ORAL | 0 refills | Status: DC
  Filled 2023-10-29: qty 90, 90d supply, fill #0

## 2023-10-29 MED ORDER — CLOPIDOGREL BISULFATE 75 MG PO TABS
75.0000 mg | ORAL_TABLET | Freq: Every day | ORAL | 0 refills | Status: DC
  Filled 2023-10-29: qty 90, 90d supply, fill #0

## 2023-10-29 NOTE — Progress Notes (Signed)
Discharge instructions reviewed with pt.  Copy of instructions given to pt. Pt informed her scripts were sent to her pharmacy---[facility]--she has used before and knows to pick them up.  Pt will be d/c'd via wheelchair with belongings, family at main entrance and will be escorted by hospital volunteer.  Mariane Burpee,RN SWOT

## 2023-10-29 NOTE — Discharge Summary (Signed)
Physician Discharge Summary  Julia Gutierrez QMV:[id number] DOB: 08/27/1956 DOA: 10/27/2023  PCP: Tollie Eth, NP  Admit date: 10/27/2023 Discharge date: 10/29/2023  Admitted From: Home Discharge disposition: Home  Recommendations at discharge:  You have been started on: Aspirin, Plavix, statin, Coreg, Imdur, amlodipine, losartan Follow-up with cardiology as an outpatient   Brief narrative: Julia Gutierrez is a 68 y.o. female with PMH significant for HTN, palpitation, hypothyroidism, CVA, anxiety/depression, chronic fatigue 1/20, patient presented to the ED at [facility] [place] for chest pain that radiated to her left arm and back.  In the ED initially, blood pressure was elevated to 213/99, heart rate 74 Troponin elevated to 278> 296 Chest x-ray unremarkable for acute cardiopulmonary findings Cardiology was consulted Patient was accepted to [facility] but patient waited overnight at [facility] ED because of not having a bed available. Patient was transferred to [facility] ED this morning. Seen by cardiology Underwent cardiac cath, no stent needed.  At the time of my evaluation, patient was in the floor postcardiac cath status. Not in distress.  No family at bedside History reviewed and detailed as above. Patient is physically active and works at a storage facility.  Subjective: Patient was seen and examined this morning.  Elderly Caucasian female. Sitting up at the edge of the bed.  Not in distress.  Daughter at bedside.  Hospital course: Principal Problem:   NSTEMI (non-ST elevated myocardial infarction) (HCC)  NSTEMI Troponin was elevated as below.  In the setting of significantly elevated blood pressure Cardiac cath showed multifocal nonsignificant stenosis without flow limitation.  No PCI target. Cardiology recommended medical management with DAPT (aspirin and Plavix).  Also, continue Coreg, Imdur, amlodipine. Recent Labs    10/27/23 1217 10/27/23 1412  TROPONINIHS 278* 296*    Hypertensive urgency Blood pressure was initially elevated over 200.  Gradually improved. Patient reports she used to have good blood pressure control with amlodipine and HCTZ.  1 week ago, PCP noted low potassium and HCTZ was switched to valsartan.  Patient has noticed blood pressure running elevated in 170s with the new regimen. Blood pressure is now better with the new regimen: Coreg 3.125 mg twice daily, amlodipine 5 mg daily, Imdur 30 mg daily, losartan 25 mg daily  H/o CVA HLD Lipid panel this morning with LDL 109, HDL 45. Lipitor 80 mg daily started.  DAPT as above  Hypothyroidism Was not on Synthroid.  TSH mildly elevated but free T4 normal  Hypokalemia Potassium low at 3.2 this morning.  Oral replacement given Recent Labs  Lab 10/27/23 1217 10/29/23 0246  K 3.9 3.2*   anxiety/depression  chronic fatigue On gabapentin nightly  Mobility: Independent  Goals of care:   Code Status: Full Code     Diet:  Diet Order             Diet general           Diet Heart Room service appropriate? Yes; Fluid consistency: Thin  Diet effective now                   Nutritional status:  Body mass index is 31.69 kg/m.       Wounds:  -    Discharge Exam:   Vitals:   10/29/23 0300 10/29/23 0429 10/29/23 0812 10/29/23 1227  BP:  127/67 134/77 107/63  Pulse:  69 89 71  Resp:  18 16 18   Temp:  (!) 97 F (36.1 C) 98.4 F (36.9 C) 98.4 F (36.9  C)  TempSrc:  Oral Oral Oral  SpO2:  94% 98% 96%  Weight: 78.6 kg     Height:        Body mass index is 31.69 kg/m.   General exam: Pleasant, elderly. Skin: No rashes, lesions or ulcers. HEENT: Atraumatic, normocephalic, no obvious bleeding Lungs: Clear to auscultation bilaterally,  CVS: S1, S2, mild systolic murmur,  GI/Abd: Soft, nontender, nondistended, bowel sound present,   CNS: Alert, awake, oriented x 3 Psychiatry: Mood appropriate,  Extremities: No pedal edema, no calf tenderness,   Follow ups:     Follow-up Information     Early, Sung Amabile, NP Follow up.   Specialty: Nurse Practitioner Contact information: [address] [place] [address] [phone]                 Discharge Instructions:   Discharge Instructions     Call MD for:  difficulty breathing, headache or visual disturbances   Complete by: As directed    Call MD for:  extreme fatigue   Complete by: As directed    Call MD for:  hives   Complete by: As directed    Call MD for:  persistant dizziness or light-headedness   Complete by: As directed    Call MD for:  persistant nausea and vomiting   Complete by: As directed    Call MD for:  severe uncontrolled pain   Complete by: As directed    Call MD for:  temperature >100.4   Complete by: As directed    Diet general   Complete by: As directed    Discharge instructions   Complete by: As directed    Recommendations at discharge:   You have been started on: Aspirin, Plavix, statin, Coreg, Imdur, amlodipine, losartan  Follow-up with cardiology as an outpatient  General discharge instructions: Follow with Primary MD Early, Sung Amabile, NP in 7 days  Please request your PCP  to go over your hospital tests, procedures, radiology results at the follow up. Please get your medicines reviewed and adjusted.  Your PCP may decide to repeat certain labs or tests as needed. Do not drive, operate heavy machinery, perform activities at heights, swimming or participation in water activities or provide baby sitting services if your were admitted for syncope or siezures until you have seen by Primary MD or a Neurologist and advised to do so again. [place] Controlled Substance Reporting System database was reviewed. Do not drive, operate heavy machinery, perform activities at heights, swim, participate in water activities or provide baby-sitting services while on medications for pain, sleep and mood until your outpatient physician has reevaluated you and  advised to do so again.  You are strongly recommended to comply with the dose, frequency and duration of prescribed medications. Activity: As tolerated with Full fall precautions use walker/cane & assistance as needed Avoid using any recreational substances like cigarette, tobacco, alcohol, or non-prescribed drug. If you experience worsening of your admission symptoms, develop shortness of breath, life threatening emergency, suicidal or homicidal thoughts you must seek medical attention immediately by calling 911 or calling your MD immediately  if symptoms less severe. You must read complete instructions/literature along with all the possible adverse reactions/side effects for all the medicines you take and that have been prescribed to you. Take any new medicine only after you have completely understood and accepted all the possible adverse reactions/side effects.  Wear Seat belts while driving. You were cared for by a hospitalist during your hospital stay.  If you have any questions about your discharge medications or the care you received while you were in the hospital after you are discharged, you can call the unit and ask to speak with the hospitalist or the covering physician. Once you are discharged, your primary care physician will handle any further medical issues. Please note that NO REFILLS for any discharge medications will be authorized once you are discharged, as it is imperative that you return to your primary care physician (or establish a relationship with a primary care physician if you do not have one).   Increase activity slowly   Complete by: As directed        Discharge Medications:   Allergies as of 10/29/2023       Reactions   Promethazine Other (See Comments)   Muscle spasms        Medication List     STOP taking these medications    amLODipine-valsartan 5-160 MG tablet Commonly known as: EXFORGE   aspirin 325 MG tablet Replaced by: aspirin EC 81 MG tablet    hydrochlorothiazide 25 MG tablet Commonly known as: HYDRODIURIL   ibuprofen 200 MG tablet Commonly known as: ADVIL   pilocarpine 5 MG tablet Commonly known as: [place]   POTASSIUM PO       TAKE these medications    acetaminophen 500 MG tablet Commonly known as: TYLENOL Take 1,000 mg by mouth 2 (two) times daily as needed for moderate pain (pain score 4-6), fever or headache.   amLODipine 5 MG tablet Commonly known as: NORVASC Take 1 tablet (5 mg total) by mouth at bedtime.   aspirin EC 81 MG tablet Take 1 tablet (81 mg total) by mouth daily. Swallow whole. Start taking on: October 30, 2023 Replaces: aspirin 325 MG tablet   atorvastatin 80 MG tablet Commonly known as: LIPITOR Take 1 tablet (80 mg total) by mouth daily. Start taking on: October 30, 2023   carvedilol 3.125 MG tablet Commonly known as: COREG Take 1 tablet (3.125 mg total) by mouth 2 (two) times daily with a meal.   clopidogrel 75 MG tablet Commonly known as: PLAVIX Take 1 tablet (75 mg total) by mouth daily. Start taking on: October 30, 2023   gabapentin 100 MG capsule Commonly known as: NEURONTIN Take 1 capsule (100 mg total) by mouth 2 (two) times daily AND 3 capsules (300 mg total) at bedtime. Use as needed for neuropathic pain. What changed: See the new instructions.   isosorbide mononitrate 30 MG 24 hr tablet Commonly known as: IMDUR Take 1 tablet (30 mg total) by mouth daily. Start taking on: October 30, 2023   losartan 25 MG tablet Commonly known as: COZAAR Take 1 tablet (25 mg total) by mouth daily. Start taking on: October 30, 2023   Vitamin D (Ergocalciferol) 1.25 MG (50000 UNIT) Caps capsule Commonly known as: DRISDOL Take 1 capsule (50,000 Units total) by mouth every 7 (seven) days. What changed: when to take this         The results of significant diagnostics from this hospitalization (including imaging, microbiology, ancillary and laboratory) are listed below for  reference.    Procedures and Diagnostic Studies:   CARDIAC CATHETERIZATION Addendum Date: 10/28/2023   Prox RCA to Mid RCA lesion is 20% stenosed.   Dist RCA lesion is 60% stenosed.   RPAV lesion is 70% stenosed.   1st Mrg lesion is 40% stenosed.   Prox Cx to Mid Cx lesion is 60% stenosed.   Ost Cx to  Prox Cx lesion is 30% stenosed.   Mid LAD lesion is 20% stenosed. Large caliber LAD that courses to the apex. Mild plaque in the mid vessel Moderate caliber Circumflex with moderate caliber obtuse marginal branch. Mild to moderate non-obstructive disease in the obtuse marginal branch. Moderate disease in the continuation of the small caliber AV groove Circumflex. Large dominant RCA with heavy calcification throughout the proximal through distal vessel. Moderate stenosis mid vessel (60% with heavy calcification). Normal LV systolic function LVEDP=9 mmHg. Recommendations: She has no focal targets for PCI. There is a moderately stenotic, heavily calcified lesion in the mid to distal RCA. This does not appear to be flow limiting. Her presentation is most likely consistent with demand ischemia in the setting of elevated blood pressure and moderate underlying CAD. I will add Plavix and Imdur. Titration of medications as needed for BP control prior to discharge. If she has recurrent angina despite optimal medical therapy, could consider PCI of the RCA with orbital atherectomy. I would plan right femoral access for her next procedure.   Result Date: 10/28/2023   Prox RCA to Mid RCA lesion is 20% stenosed.   Dist RCA lesion is 60% stenosed.   RPAV lesion is 70% stenosed.   1st Mrg lesion is 40% stenosed.   Prox Cx to Mid Cx lesion is 60% stenosed.   Ost Cx to Prox Cx lesion is 30% stenosed.   Mid LAD lesion is 20% stenosed. Large caliber LAD that courses to the apex. Mild plaque in the mid vessel Moderate caliber Circumflex with moderate caliber obtuse marginal branch. Mild to moderate non-obstructive disease in the  obtuse marginal branch. Moderate disease in the continuation of the small caliber AV groove Circumflex. Large dominant RCA with heavy calcification throughout the proximal through distal vessel. Moderate stenosis mid vessel (60% with heavy calcification). Normal LV systolic function LVEDP=9 mmHg. Recommendations: She has no focal targets for PCI. There is a moderately stenotic, heavily calcified lesion in the mid to distal RCA. This does not appear to be flow limiting. Her presentation is most likely consistent with demand ischemia in the setting of elevated blood pressure and moderate underlying CAD. I will add Plavix and Imdur. Titration of medications as needed for BP control prior to discharge.   CT Angio Chest/Abd/Pel for Dissection W and/or Wo Contrast Result Date: 10/27/2023 CLINICAL DATA:  Left-sided chest pain for 2 days with radiation to the left arm and back. Hypertensive EXAM: CT ANGIOGRAPHY CHEST, ABDOMEN AND PELVIS TECHNIQUE: Non-contrast CT of the chest was initially obtained. Multidetector CT imaging through the chest, abdomen and pelvis was performed using the standard protocol during bolus administration of intravenous contrast. Multiplanar reconstructed images and MIPs were obtained and reviewed to evaluate the vascular anatomy. RADIATION DOSE REDUCTION: This exam was performed according to the departmental dose-optimization program which includes automated exposure control, adjustment of the mA and/or kV according to patient size and/or use of iterative reconstruction technique. CONTRAST:  OMNIPAQUE IOHEXOL 350 MG/ML SOLN COMPARISON:  Chest x-ray 10/27/2023 and older. CTA head and neck 06/12/2022 FINDINGS: CTA CHEST FINDINGS Cardiovascular: Thoracic aorta is without COVID there are high density on the noncontrast dataset. There are scattered vascular calcifications along the thoracic aorta. Mild pulsation artifact. Diameter of the ascending aorta at the level of the right pulmonary  artery measures 3.7 3.8 cm. The descending thoracic aorta at the same level measures 2.5 by 2.5 cm. Diameter of the aortic root approaches 2.8 cm. Distal aortic arch has a diameter  of 2.4 cm. Scattered atherosclerotic calcified plaque. There is some mild plaque along the origin of the great vessels as well. Small amount of calcification as well in the area of the aortic valve. Coronary artery calcifications are seen. The heart is nonenlarged. No pericardial effusion. Mediastinum/Nodes: No specific abnormal lymph node enlargement identified in the axillary region, hilum or mediastinum. A few small less than 1 cm size nodes are seen in the mediastinum and hilum, nonpathologic by size criteria. Small hiatal hernia. Slight wall thickening along the esophagus. Please correlate with symptoms. The thyroid gland is preserved. Lungs/Pleura: No consolidation, pneumothorax or effusion. There is some linear opacity along bases likely scar or atelectasis. There is noncalcified juxtapleural nodule in the superior segment of the left lower lobe posteriorly on series 7, image 84 measuring 6 mm. Similar focus right lower lobe measures 7 mm on series 7, image 89. Musculoskeletal: Osteopenia. Scattered degenerative changes along the spine with multilevel Schmorl's node deformities. Chronic appearing deformity along lateral lower left ribs. Review of the MIP images confirms the above findings. CTA ABDOMEN AND PELVIS FINDINGS VASCULAR Aorta: Scattered atherosclerotic calcified plaque identified along the aorta without aneurysm formation or dissection. Celiac: Patent without evidence of aneurysm, dissection, vasculitis or significant stenosis. SMA: Patent without evidence of aneurysm, dissection, vasculitis or significant stenosis. Mild atherosclerotic plaque. Renals: Single bilateral main renal arteries identified. There is some mild calcified plaque along the origin of both main renal arteries. There is of note an early branch of the  left renal artery within 1 cm of the ostium. IMA: Patent without evidence of aneurysm, dissection, vasculitis or significant stenosis. Inflow: Mild atherosclerotic calcified plaque seen of the iliac vessels particularly the common iliac arteries without significant stenosis. There is some calcified plaque along the internal iliac arteries. Minimal disease of the external. Veins: No obvious venous abnormality within the limitations of this arterial phase study. Review of the MIP images confirms the above findings. NON-VASCULAR Hepatobiliary: With the limits of the early phase of the arterial bolus, grossly the liver is preserved. Gallbladder is poorly seen. Pancreas: Unremarkable. No pancreatic ductal dilatation or surrounding inflammatory changes. Spleen: Normal in size without focal abnormality. Adrenals/Urinary Tract: Adrenal glands are unremarkable. Kidneys are normal, without renal calculi, focal lesion, or hydronephrosis. Bladder is unremarkable. Stomach/Bowel: Large bowel has a normal course and caliber with scattered colonic stool. Few sigmoid colon diverticula. Normal appendix. Stomach is underdistended. Lymphatic: There are some prominent central mesenteric nodes identified. These are less than a cm in short axis but much more numerous than usually seen with some associated stranding of the central mesentery. No retroperitoneal or pelvic lymph node enlargement Reproductive: Uterus and bilateral adnexa are unremarkable. Other: No free air or free fluid. Small fat containing umbilical hernia. Musculoskeletal: Curvature of the spine with some degenerative changes. Review of the MIP images confirms the above findings. IMPRESSION: Scattered atherosclerotic calcified plaque. No dissection or aneurysm formation. There is pulsation artifact along the ascending aorta. Coronary artery calcifications. Small hiatal hernia with some slight wall thickening of the esophagus. Please correlate with symptoms and additional  evaluation when clinically appropriate. Juxtapleural small lung nodules. These measure up to 7 mm. Non-contrast chest CT at 3-6 months is recommended. If the nodules are stable at time of repeat CT, then future CT at 18-24 months (from today's scan) is considered optional for low-risk patients, but is recommended for high-risk patients. This recommendation follows the consensus statement: Guidelines for Management of Incidental Pulmonary Nodules Detected on CT Images: From  the Fleischner Society 2017; Radiology 2017; [phone]. Few sigmoid colon diverticula.  No bowel obstruction. Central mesenteric haziness with some prominent nodes. More numerous than usually seen. Please correlate with any prior imaging of the abdomen and pelvis or additional follow up in 6 months to help confirm stability and a benign process. Electronically Signed   By: Karen Kays M.D.   On: 10/27/2023 14:54   DG Chest 2 View Result Date: 10/27/2023 CLINICAL DATA:  Chest pain. EXAM: CHEST - 2 VIEW COMPARISON:  Chest radiograph dated October 28, 2022. FINDINGS: Stable cardiomediastinal silhouette. Aortic atherosclerosis. Mild right basilar linear scarring, unchanged. No focal consolidation, pleural effusion, or pneumothorax. No acute osseous abnormality. IMPRESSION: No acute cardiopulmonary findings. Electronically Signed   By: Hart Robinsons M.D.   On: 10/27/2023 13:24     Labs:   Basic Metabolic Panel: Recent Labs  Lab 10/27/23 1217 10/29/23 0246  NA 136 138  K 3.9 3.2*  CL 100 105  CO2 27 27  GLUCOSE 103* 118*  BUN 20 12  CREATININE 0.73 0.77  CALCIUM 9.8 8.8*   GFR Estimated Creatinine Clearance: 64.4 mL/min (by C-G formula based on SCr of 0.77 mg/dL). Liver Function Tests: No results for input(s): "AST", "ALT", "ALKPHOS", "BILITOT", "PROT", "ALBUMIN" in the last 168 hours. No results for input(s): "LIPASE", "AMYLASE" in the last 168 hours. No results for input(s): "AMMONIA" in the last 168  hours. Coagulation profile No results for input(s): "INR", "PROTIME" in the last 168 hours.  CBC: Recent Labs  Lab 10/27/23 1217 10/29/23 0246  WBC 13.4* 11.7*  HGB 13.0 12.4  HCT 39.8 38.1  MCV 82.9 83.4  PLT 411* 387   Cardiac Enzymes: No results for input(s): "CKTOTAL", "CKMB", "CKMBINDEX", "TROPONINI" in the last 168 hours. BNP: Invalid input(s): "POCBNP" CBG: No results for input(s): "GLUCAP" in the last 168 hours. D-Dimer No results for input(s): "DDIMER" in the last 72 hours. Hgb A1c Recent Labs    10/29/23 0246  HGBA1C 5.8*   Lipid Profile Recent Labs    10/29/23 0246  CHOL 174  HDL 45  LDLCALC 109*  TRIG 98  CHOLHDL 3.9   Thyroid function studies Recent Labs    10/29/23 0246  TSH 5.103*   Anemia work up No results for input(s): "VITAMINB12", "FOLATE", "FERRITIN", "TIBC", "IRON", "RETICCTPCT" in the last 72 hours. Microbiology No results found for this or any previous visit (from the past 240 hours).  Time coordinating discharge: 45 minutes  Signed: Issacc Merlo  [facility] 10/29/2023, 12:51 PM

## 2023-10-29 NOTE — TOC Initial Note (Signed)
Transition of Care [facility]) - Initial/Assessment Note    Patient Details  Name: Julia Gutierrez MRN: [id number] Date of Birth: 1956-09-02  Transition of Care [facility]) CM/SW Contact:    Leone Haven, RN Phone Number: 10/29/2023, 10:00 AM  Clinical Narrative:                 From home alone, has PCP, Dr. Arbie Cookey  and insurance on file, states has no HH services in place at this time or DME at home.  States daughter will transport her home at [employer] and family is support system, states gets medications from [place], but she would like TOC pharmacy to fill meds prior to dc. Pta self ambulatory.  Expected Discharge Plan: Home/Self Care Barriers to Discharge: No Barriers Identified   Patient Goals and CMS Choice Patient states their goals for this hospitalization and ongoing recovery are:: return home   Choice offered to / list presented to : NA      Expected Discharge Plan and Services In-house Referral: NA Discharge Planning Services: CM Consult Post Acute Care Choice: NA Living arrangements for the past 2 months: Single Family Home                 DME Arranged: N/A DME Agency: NA       HH Arranged: NA          Prior Living Arrangements/Services Living arrangements for the past 2 months: Single Family Home Lives with:: Self Patient language and need for interpreter reviewed:: Yes Do you feel safe going back to the place where you live?: Yes      Need for Family Participation in Patient Care: Yes (Comment) Care giver support system in place?: Yes (comment)   Criminal Activity/Legal Involvement Pertinent to Current Situation/Hospitalization: No - Comment as needed  Activities of Daily Living   ADL Screening (condition at time of admission) Independently performs ADLs?: Yes (appropriate for developmental age) Is the patient deaf or have difficulty hearing?: No Does the patient have difficulty seeing, even when wearing glasses/contacts?: No Does the patient have  difficulty concentrating, remembering, or making decisions?: No  Permission Sought/Granted Permission sought to share information with : Case Manager Permission granted to share information with : Yes, Verbal Permission Granted              Emotional Assessment Appearance:: Appears stated age Attitude/Demeanor/Rapport: Engaged Affect (typically observed): Appropriate Orientation: : Oriented to Self, Oriented to Place, Oriented to  Time, Oriented to Situation Alcohol / Substance Use: Not Applicable Psych Involvement: No (comment)  Admission diagnosis:  NSTEMI (non-ST elevated myocardial infarction) [facility]) [I21.4] Patient Active Problem List   Diagnosis Date Noted   NSTEMI (non-ST elevated myocardial infarction) (HCC) 10/27/2023   Attention deficit hyperactivity disorder (ADHD), predominantly inattentive type 07/03/2023   Primary insomnia 07/03/2023   Dehydration 05/20/2023   Muscle spasms of neck 05/20/2023   Periumbilical abdominal pain 03/07/2023   Lumbar pain 12/23/2022   Atherosclerosis of aorta (HCC) 10/29/2022   Skin lesion 10/29/2022   Post-COVID-19 syndrome 10/20/2022   Primary hypertension 09/17/2022   Vertigo 08/05/2022   Neurological signs 06/13/2022   Memory changes 05/26/2022   B12 deficiency 02/24/2022   Colicky RUQ abdominal pain 02/24/2022   Rib pain on right side 12/04/2021   Sjogren syndrome with dental involvement (HCC) 08/28/2021   Dry mouth 06/18/2021   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   Benign paroxysmal vertigo of both ears 05/28/2021   Migraine with status migrainosus, not intractable 05/04/2021  Tooth infection 05/04/2021   Chronic fatigue 04/13/2021   Bilateral dry eyes 04/13/2021   Vision changes 04/13/2021   PCP:  Tollie Eth, NP Pharmacy:   [facility] [facility] [address] [place] [address] Phone: [phone] Fax: [phone]  [facility] [address] [place] [address] Phone: [phone] Fax: [phone]  [id number] [place], [place] - [address] [facility] [address] [address] Phone: [phone] Fax: [phone]     Social Drivers of Health (SDOH) Social History: SDOH Screenings   Food Insecurity: No Food Insecurity (10/28/2023)  Housing: Low Risk  (10/28/2023)  Transportation Needs: No Transportation Needs (10/28/2023)  Utilities: Not At Risk (10/28/2023)  Alcohol Screen: Low Risk  (10/29/2022)  Depression (PHQ2-9): Low Risk  (12/18/2022)  Recent Concern: Depression (PHQ2-9) - High Risk (10/29/2022)  Social Connections: Unknown (10/28/2023)  Tobacco Use: Low Risk  (10/27/2023)   SDOH Interventions:     Readmission Risk Interventions     No data to display

## 2023-10-29 NOTE — TOC Transition Note (Signed)
Transition of Care [facility]) - Discharge Note   Patient Details  Name: Julia Gutierrez MRN: [id number] Date of Birth: 11-17-1955  Transition of Care [facility]) CM/SW Contact:  Leone Haven, RN Phone Number: 10/29/2023, 1:04 PM   Clinical Narrative:    For dc today, has no needs.   Final next level of care: Home/Self Care Barriers to Discharge: No Barriers Identified   Patient Goals and CMS Choice Patient states their goals for this hospitalization and ongoing recovery are:: return home   Choice offered to / list presented to : NA      Discharge Placement                       Discharge Plan and Services Additional resources added to the After Visit Summary for   In-house Referral: NA Discharge Planning Services: CM Consult Post Acute Care Choice: NA          DME Arranged: N/A DME Agency: NA       HH Arranged: NA          Social Drivers of Health (SDOH) Interventions SDOH Screenings   Food Insecurity: No Food Insecurity (10/28/2023)  Housing: Low Risk  (10/28/2023)  Transportation Needs: No Transportation Needs (10/28/2023)  Utilities: Not At Risk (10/28/2023)  Alcohol Screen: Low Risk  (10/29/2022)  Depression (PHQ2-9): Low Risk  (12/18/2022)  Recent Concern: Depression (PHQ2-9) - High Risk (10/29/2022)  Social Connections: Unknown (10/28/2023)  Tobacco Use: Low Risk  (10/27/2023)     Readmission Risk Interventions     No data to display

## 2023-10-29 NOTE — Progress Notes (Addendum)
Patient Name: Julia Gutierrez Date of Encounter: 10/29/2023 [facility] Cardiologist: Nanetta Batty, MD   Interval Summary  .    Patient up and moving in her room on exam today. She reports no acute complaints, no chest pain or dyspnea. Also denies headache. She is anxious to discharge home.   Vital Signs .    Vitals:   10/28/23 2305 10/29/23 0300 10/29/23 0429 10/29/23 0812  BP: (!) 144/71  127/67 134/77  Pulse:   69 89  Resp: 18  18 16   Temp: 98.1 F (36.7 C)  (!) 97 F (36.1 C) 98.4 F (36.9 C)  TempSrc: Oral  Oral Oral  SpO2: 91%  94% 98%  Weight:  78.6 kg    Height:        Intake/Output Summary (Last 24 hours) at 10/29/2023 0936 Last data filed at 10/29/2023 0810 Gross per 24 hour  Intake 955.76 ml  Output 400 ml  Net 555.76 ml      10/29/2023    3:00 AM 10/28/2023    4:17 PM 10/27/2023   12:11 PM  Last 3 Weights  Weight (lbs) 173 lb 4.5 oz 174 lb 9.7 oz 182 lb 15.7 oz  Weight (kg) 78.6 kg 79.2 kg 83 kg      Telemetry/ECG    Sinus rhythm - Personally Reviewed  Physical Exam .   GEN: No acute distress.   Neck: No JVD Cardiac: RRR, no murmurs, rubs, or gallops.  Respiratory: Clear to auscultation bilaterally. GI: Soft, nontender, non-distended  MS: No edema  Assessment & Plan .     Chest pain NSTEMI  Patient presented to the ED on 10/27/23 for evaluation of chest pain that radiated into left arm and back. Initial vital signs in the ED showed BP 213/99, oxygen 100% on room air, HR 74 BPM. Labs in the ED showed K 3.9, creatinine 0.73, WBC 13.4, hemoglobin 13.4, platelets 411. Hstn [id number]. LHC on 1/21 with large caliber LAD that courses to the apex. Mild plaque in the mid vessel. Moderate caliber Circumflex with moderate caliber obtuse marginal branch. Mild to moderate non-obstructive disease in the obtuse marginal branch. Moderate disease in the continuation of the small caliber AV groove Circumflex. Large dominant RCA with heavy calcification  throughout the proximal through distal vessel. Moderate stenosis mid vessel (60% with heavy calcification).   No focal PCI targets. Distal RCA lesion not felt to be flow limiting. Suspect demand ischemia with hypertensive urgency.  Medical management of NSTEMI. Plavix started post cath, continue DAPT with ASA. Continue Imdur 30mg  daily Continue Coreg 3.125mg  BID Continue Amlodipine 5mg . Echo pending today. Close outpatient follow up arranged.  Hypertension  BP up to 213/99 on arrival. Prior to admission, patient was on amlodipine-valsartan 5-160 mg daily. Was previously on amlodipine-hydrochlorothiazide prior. Her BP has been elevated for the past week or so, and she was trying to manage her BP by taking extra doses of her medications.   BP now much better controlled on regimen of Coreg 3.125mg  BID, Amlodipine 5mg , Imdur 30mg . Would prefer to add ARB back to regimen for renal protection. Start Losartan 25mg .   Hyperlipidemia  LDL 109, HDL 45, triglycerides 98 this admission.  Patient started on Atorvastatin 80mg , will need repeat lipids and LFTs in 8 weeks.    For questions or updates, please contact [facility] Please consult [email or web address] for contact info under        Signed, Perlie Gold, PA-C    Agree with note by  Perlie Gold, PA-C.  Patient mated with chest pain and positive enzymes.  Catheterization reviewed revealing 60% fairly focal mid to distal dominant RCA stenosis that did not appear to be physiologically significant.  LV function was normal.  Blood pressures under better control with medications that were adjusted.  I suspect this was demand ischemia related to high blood pressure.  Exam benign.  Patient ready for discharge.  Will arrange outpatient follow-up.  Runell Gess, M.D., FACP, [facility], [facility] [address] [place], [place]  [address]  [phone] 10/29/2023 1:43 PM

## 2023-10-29 NOTE — Progress Notes (Signed)
Echocardiogram 2D Echocardiogram has been performed.  Julia Gutierrez 10/29/2023, 2:16 PM

## 2023-10-29 NOTE — Consult Note (Signed)
[facility] Liaison Consult Note   10/29/2023  Kennetta Acomb [birth year] [id number]  Insurance: Medicare ACO Re[employer]   Primary Care Provider: Early, Sung Amabile, NP with [facility], this provider is listed for the transition of care follow up appointments  and [facility] calls   [facility] Liaison met patient at bedside at [facility].  Patient confirms PCP and states her daughter is a Engineer, civil (consulting) and feels that she did not need any additional assistance.   The patient was screened for OBS day readmission hospitalization with noted for NSTEMI. The patient was assessed for potential Community Care Coordination service needs for post hospital transition for care coordination.   Plan: [facility] Liaison will continue to follow progress and disposition to asess for post hospital community care coordination/management needs.  Referral request for community care coordination: Patient expresses no needs.   VBCI Community Care, Population Health does not replace or interfere with any arrangements made by the Inpatient Transition of Care team.   For questions contact:   Charlesetta Shanks, RN, BSN, CCM [facility], [facility] Liaison Direct Dial: [phone] or secure chat Email: [email or web address]

## 2023-10-29 NOTE — Plan of Care (Signed)

## 2023-10-29 NOTE — Care Management Obs Status (Signed)
MEDICARE OBSERVATION STATUS NOTIFICATION   Patient Details  Name: Julia Gutierrez MRN: [id number] Date of Birth: October 05, 1956   Medicare Observation Status Notification Given:  Yes    Leone Haven, RN 10/29/2023, 9:09 AM

## 2023-11-05 ENCOUNTER — Other Ambulatory Visit (HOSPITAL_BASED_OUTPATIENT_CLINIC_OR_DEPARTMENT_OTHER): Payer: Self-pay

## 2023-11-06 ENCOUNTER — Ambulatory Visit: Payer: Medicare Other | Attending: Nurse Practitioner | Admitting: Nurse Practitioner

## 2023-11-06 ENCOUNTER — Encounter: Payer: Self-pay | Admitting: Nurse Practitioner

## 2023-11-06 VITALS — BP 142/72 | HR 68 | Ht 62.0 in | Wt 184.8 lb

## 2023-11-06 DIAGNOSIS — Z8673 Personal history of transient ischemic attack (TIA), and cerebral infarction without residual deficits: Secondary | ICD-10-CM | POA: Insufficient documentation

## 2023-11-06 DIAGNOSIS — I350 Nonrheumatic aortic (valve) stenosis: Secondary | ICD-10-CM | POA: Insufficient documentation

## 2023-11-06 DIAGNOSIS — I2510 Atherosclerotic heart disease of native coronary artery without angina pectoris: Secondary | ICD-10-CM | POA: Diagnosis present

## 2023-11-06 DIAGNOSIS — I10 Essential (primary) hypertension: Secondary | ICD-10-CM | POA: Insufficient documentation

## 2023-11-06 DIAGNOSIS — E785 Hyperlipidemia, unspecified: Secondary | ICD-10-CM | POA: Diagnosis present

## 2023-11-06 LAB — BASIC METABOLIC PANEL
BUN/Creatinine Ratio: 20 (ref 12–28)
BUN: 16 mg/dL (ref 8–27)
CO2: 20 mmol/L (ref 20–29)
Calcium: 9.2 mg/dL (ref 8.7–10.3)
Chloride: 105 mmol/L (ref 96–106)
Creatinine, Ser: 0.79 mg/dL (ref 0.57–1.00)
Glucose: 133 mg/dL — ABNORMAL HIGH (ref 70–99)
Potassium: 4.2 mmol/L (ref 3.5–5.2)
Sodium: 143 mmol/L (ref 134–144)
eGFR: 81 mL/min/{1.73_m2} (ref >59)

## 2023-11-06 NOTE — Progress Notes (Signed)
Office Visit    Patient Name: Julia Gutierrez Date of Encounter: 11/06/2023  Primary Care Provider:  Tollie Eth, NP Primary Cardiologist:  Nanetta Batty, MD  Chief Complaint    68 year old female with a history of CAD, aortic stenosis, palpitations, hypertension, hyperlipidemia, CVA, hypothyroidism, anxiety, depression, and chronic fatigue who presents for hospital follow-up related to CAD s/p NSTEMI, hypertension.  Past Medical History    Past Medical History:  Diagnosis Date   Acute cystitis without hematuria 08/28/2021   Adjustment reaction with anxiety and depression 05/23/2022   Allergy 2002   Anxiety and depression 04/13/2021   Anxiety and depression 04/13/2021   Cervical spondylosis 05/04/2021   Cervicogenic headache 04/13/2021   Chronic fatigue 04/13/2021   Depression    Encounter for annual physical exam 04/13/2021   Encounter to establish care 04/13/2021   Enlarged glands 06/18/2021   Heart murmur    Hypertension    Hypothyroidism    IBS (irritable bowel syndrome)    Increased thirst 04/13/2021   Mouth dryness 04/13/2021   NSTEMI (non-ST elevated myocardial infarction) (HCC) 10/27/2023   Palpitations 04/13/2021   Stroke (HCC) Oct 2023   Vertigo 04/13/2021   Past Surgical History:  Procedure Laterality Date   CHOLECYSTECTOMY     LEFT HEART CATH AND CORONARY ANGIOGRAPHY N/A 10/28/2023   Procedure: LEFT HEART CATH AND CORONARY ANGIOGRAPHY;  Surgeon: Kathleene Hazel, MD;  Location: [facility];  Service: Cardiovascular;  Laterality: N/A;    Allergies  Allergies  Allergen Reactions   Promethazine Other (See Comments)    Muscle spasms      Labs/Other Studies Reviewed    The following studies were reviewed today:  Cardiac Studies & Procedures   CARDIAC CATHETERIZATION  CARDIAC CATHETERIZATION 10/28/2023  Narrative   Prox RCA to Mid RCA lesion is 20% stenosed.   Dist RCA lesion is 60% stenosed.   RPAV lesion is 70% stenosed.    1st Mrg lesion is 40% stenosed.   Prox Cx to Mid Cx lesion is 60% stenosed.   Ost Cx to Prox Cx lesion is 30% stenosed.   Mid LAD lesion is 20% stenosed.  Large caliber LAD that courses to the apex. Mild plaque in the mid vessel Moderate caliber Circumflex with moderate caliber obtuse marginal branch. Mild to moderate non-obstructive disease in the obtuse marginal branch. Moderate disease in the continuation of the small caliber AV groove Circumflex. Large dominant RCA with heavy calcification throughout the proximal through distal vessel. Moderate stenosis mid vessel (60% with heavy calcification). Normal LV systolic function LVEDP=9 mmHg.  Recommendations: She has no focal targets for PCI. There is a moderately stenotic, heavily calcified lesion in the mid to distal RCA. This does not appear to be flow limiting. Her presentation is most likely consistent with demand ischemia in the setting of elevated blood pressure and moderate underlying CAD. I will add Plavix and Imdur. Titration of medications as needed for BP control prior to discharge. If she has recurrent angina despite optimal medical therapy, could consider PCI of the RCA with orbital atherectomy. I would plan right femoral access for her next procedure.  Findings Coronary Findings Diagnostic  Dominance: Right  Left Anterior Descending Vessel is large. Mid LAD lesion is 20% stenosed.  Left Circumflex Vessel is moderate in size. Ost Cx to Prox Cx lesion is 30% stenosed. Prox Cx to Mid Cx lesion is 60% stenosed.  First Obtuse Marginal Branch 1st Mrg lesion is 40% stenosed.  Right Coronary Artery  Vessel is large. Prox RCA to Mid RCA lesion is 20% stenosed. The lesion is calcified. Dist RCA lesion is 60% stenosed. The lesion is calcified.  Right Posterior Atrioventricular Artery RPAV lesion is 70% stenosed.  Intervention  No interventions have been documented.    ECHOCARDIOGRAM  ECHOCARDIOGRAM COMPLETE  10/29/2023  Narrative ECHOCARDIOGRAM REPORT    Patient Name:   Julia Gutierrez Date of Exam: 10/29/2023 Medical Rec #:  [id number]      Height:       62.0 in Accession #:    [id number]     Weight:       173.3 lb Date of Birth:  11/26/1955       BSA:          1.799 m Patient Age:    67 years       BP:           107/63 mmHg Patient Gender: F              HR:           72 bpm. Exam Location:  Inpatient  Procedure: 2D Echo, Cardiac Doppler and Color Doppler  Indications:    Chest Pain R07.9  History:        Patient has no prior history of Echocardiogram examinations. Previous Myocardial Infarction, Migraine; Risk Factors:Hypertension.  Sonographer:    Lucendia Herrlich RCS Referring Phys: [id number] Perlie Gold  IMPRESSIONS   1. Left ventricular ejection fraction, by estimation, is 65 to 70%. The left ventricle has normal function. The left ventricle has no regional wall motion abnormalities. There is mild left ventricular hypertrophy. Left ventricular diastolic parameters were normal. 2. Right ventricular systolic function is normal. The right ventricular size is normal. There is mildly elevated pulmonary artery systolic pressure. The estimated right ventricular systolic pressure is 36.9 mmHg. 3. The mitral valve is degenerative. Trivial mitral valve regurgitation. No evidence of mitral stenosis. 4. The aortic valve is calcified. There is moderate calcification of the aortic valve. Aortic valve regurgitation is not visualized. Mild to moderate aortic valve stenosis. Mild AS by gradients (Vmax 2.4 m/s, MG ), moderate by AVA (1.0cm^2) and DI (0.38) 5. The inferior vena cava is dilated in size with <50% respiratory variability, suggesting right atrial pressure of 15 mmHg.  FINDINGS Left Ventricle: Left ventricular ejection fraction, by estimation, is 65 to 70%. The left ventricle has normal function. The left ventricle has no regional wall motion abnormalities. The left ventricular  internal cavity size was normal in size. There is mild left ventricular hypertrophy. Left ventricular diastolic parameters were normal.  Right Ventricle: The right ventricular size is normal. No increase in right ventricular wall thickness. Right ventricular systolic function is normal. There is mildly elevated pulmonary artery systolic pressure. The tricuspid regurgitant velocity is 2.34 m/s, and with an assumed right atrial pressure of 15 mmHg, the estimated right ventricular systolic pressure is 36.9 mmHg.  Left Atrium: Left atrial size was normal in size.  Right Atrium: Right atrial size was normal in size.  Pericardium: There is no evidence of pericardial effusion.  Mitral Valve: The mitral valve is degenerative in appearance. Trivial mitral valve regurgitation. No evidence of mitral valve stenosis.  Tricuspid Valve: The tricuspid valve is normal in structure. Tricuspid valve regurgitation is trivial.  Aortic Valve: The aortic valve is calcified. There is moderate calcification of the aortic valve. Aortic valve regurgitation is not visualized. Mild to moderate aortic stenosis is present. Aortic valve mean gradient measures 9.5 mmHg.  Aortic valve peak gradient measures 18.9 mmHg. Aortic valve area, by VTI measures 1.12 cm.  Pulmonic Valve: The pulmonic valve was not well visualized. Pulmonic valve regurgitation is not visualized.  Aorta: The aortic root and ascending aorta are structurally normal, with no evidence of dilitation.  Venous: The inferior vena cava is dilated in size with less than 50% respiratory variability, suggesting right atrial pressure of 15 mmHg.  IAS/Shunts: No atrial level shunt detected by color flow Doppler.   LEFT VENTRICLE PLAX 2D LVIDd:         4.30 cm   Diastology LVIDs:         2.70 cm   LV e' medial:    9.46 cm/s LV PW:         1.00 cm   LV E/e' medial:  7.7 LV IVS:        1.00 cm   LV e' lateral:   9.14 cm/s LVOT diam:     1.80 cm   LV E/e'  lateral: 7.9 LV SV:         49 LV SV Index:   27 LVOT Area:     2.54 cm   RIGHT VENTRICLE             IVC RV S prime:     12.70 cm/s  IVC diam: 2.20 cm TAPSE (M-mode): 2.3 cm  LEFT ATRIUM             Index        RIGHT ATRIUM           Index LA diam:        3.80 cm 2.11 cm/m   RA Area:     16.50 cm LA Vol (A2C):   44.4 ml 24.69 ml/m  RA Volume:   38.90 ml  21.63 ml/m LA Vol (A4C):   46.9 ml 26.07 ml/m LA Biplane Vol: 46.5 ml 25.85 ml/m AORTIC VALVE AV Area (Vmax):    1.15 cm AV Area (Vmean):   1.12 cm AV Area (VTI):     1.12 cm AV Vmax:           217.50 cm/s AV Vmean:          142.000 cm/s AV VTI:            0.435 m AV Peak Grad:      18.9 mmHg AV Mean Grad:      9.5 mmHg LVOT Vmax:         98.50 cm/s LVOT Vmean:        62.300 cm/s LVOT VTI:          0.192 m LVOT/AV VTI ratio: 0.44  AORTA Ao Root diam: 2.90 cm Ao Asc diam:  3.70 cm  MITRAL VALVE               TRICUSPID VALVE MV Area (PHT): 2.39 cm    TR Peak grad:   21.9 mmHg MV Decel Time: 317 msec    TR Vmax:        234.00 cm/s MV E velocity: 72.40 cm/s MV A velocity: 97.00 cm/s  SHUNTS MV E/A ratio:  0.75        Systemic VTI:  0.19 m Systemic Diam: 1.80 cm  Epifanio Lesches MD Electronically signed by Epifanio Lesches MD Signature Date/Time: 10/29/2023/4:47:16 PM    Final            Recent Labs: 12/18/2022: ALT 19 10/29/2023: BUN 12; Creatinine, Ser 0.77; Hemoglobin 12.4; Platelets  387; Potassium 3.2; Sodium 138; TSH 5.103  Recent Lipid Panel    Component Value Date/Time   CHOL 174 10/29/2023 0246   CHOL 189 02/20/2022 0954   TRIG 98 10/29/2023 0246   HDL 45 10/29/2023 0246   HDL 53 02/20/2022 0954   CHOLHDL 3.9 10/29/2023 0246   VLDL 20 10/29/2023 0246   LDLCALC 109 (H) 10/29/2023 0246   LDLCALC 118 (H) 02/20/2022 0954    History of Present Illness    68 year old female with the above past medical history including CAD, aortic stenosis, palpitations, hypertension,  hyperlipidemia, CVA, hypothyroidism, anxiety, depression, and chronic fatigue.  Prior echocardiogram in 06/2022 in the setting of CVA showed EF 60 to 65%, no RWMA, mild AS, mild MR.  She presented to the ED on 10/27/2023 in the setting of chest pain that radiated into her left arm and back.  Blood pressure was elevated 213/99.  Troponin was elevated.  CT of the chest/abdomen/pelvis showed scattered atherosclerotic plaque, no dissection or aneurysm, coronary artery calcifications, hiatal hernia, juxtapleural small lung nodules. Repeat noncontrast CT was recommended in 3 to 6 months. Cardiology was consulted in the setting of NSTEMI. LHC on 1/21 revealed large caliber LAD that courses to the apex. Mild plaque in the mid vessel. Moderate caliber Circumflex with moderate caliber obtuse marginal branch. Mild to moderate non-obstructive disease in the obtuse marginal branch. Moderate disease in the continuation of the small caliber AV groove Circumflex. Large dominant RCA with heavy calcification throughout the proximal through distal vessel. Moderate stenosis mid vessel (60% with heavy calcification). With no focal PCI targets, medical management of NSTEMI was advised.  It was felt that her NSTEMI was due to demand ischemia in the setting of hypertensive urgency.  She was started on DAPT with aspirin and Plavix in addition to Imdur, Lipitor. Echocardiogram showed EF 65 to 70%, normal LV function, no RWMA, LVH, normal RV systolic function, mild to moderate aortic valve stenosis, mean gradient 9.5 mmHg.  She was discharged home in stable condition on 08/28/2024. BP was stable at discharge.   She presents today for follow-up accompanied by her daughter.  Since her hospitalization she has been stable overall from a cardiac standpoint.  Immediately following her hospital stay she complained of dull aching chest discomfort that lasted all day, no association with physical exertion.  Symptoms began approximately 1 hour after  taking her medication.  She has not had any chest discomfort for the last 3 days.  She denies palpitations, dizziness, dyspnea, edema, PND, orthopnea, weight gain.  BP is elevated in office today, she did not take her medications prior to this visit, has been stable overall.  She is back to work, overall she reports feeling well.  Home Medications    Current Outpatient Medications  Medication Sig Dispense Refill   acetaminophen (TYLENOL) 500 MG tablet Take 1,000 mg by mouth 2 (two) times daily as needed for moderate pain (pain score 4-6), fever or headache.     amLODipine (NORVASC) 5 MG tablet Take 1 tablet (5 mg total) by mouth at bedtime. 90 tablet 0   aspirin EC 81 MG tablet Take 1 tablet (81 mg total) by mouth daily. Swallow whole. 90 tablet 0   atorvastatin (LIPITOR) 80 MG tablet Take 1 tablet (80 mg total) by mouth daily. 90 tablet 0   carvedilol (COREG) 3.125 MG tablet Take 1 tablet (3.125 mg total) by mouth 2 (two) times daily with a meal. 180 tablet 0   clindamycin (CLEOCIN) 300 MG  capsule Take 300 mg by mouth 3 (three) times daily.     clopidogrel (PLAVIX) 75 MG tablet Take 1 tablet (75 mg total) by mouth daily. 90 tablet 0   gabapentin (NEURONTIN) 100 MG capsule Take 1 capsule (100 mg total) by mouth 2 (two) times daily AND 3 capsules (300 mg total) at bedtime. Use as needed for neuropathic pain. (Patient taking differently: Take 2-3 capsules (200-300mg ) by mouth at bedtime.) 150 capsule 3   isosorbide mononitrate (IMDUR) 30 MG 24 hr tablet Take 1 tablet (30 mg total) by mouth daily. 90 tablet 0   losartan (COZAAR) 25 MG tablet Take 1 tablet (25 mg total) by mouth daily. 90 tablet 0   Vitamin D, Ergocalciferol, (DRISDOL) 1.25 MG (50000 UNIT) CAPS capsule Take 1 capsule (50,000 Units total) by mouth every 7 (seven) days. (Patient taking differently: Take 50,000 Units by mouth every Sunday.) 12 capsule 3   No current facility-administered medications for this visit.     Review of  Systems    She denies chest pain, palpitations, dyspnea, pnd, orthopnea, n, v, dizziness, syncope, edema, weight gain, or early satiety. All other systems reviewed and are otherwise negative except as noted above.   Physical Exam    VS:  BP (!) 142/72   Pulse 68   Ht 5\' 2"  (1.575 m)   Wt 184 lb 12.8 oz (83.8 kg)   SpO2 96%   BMI 33.80 kg/m   GEN: Well nourished, well developed, in no acute distress. HEENT: normal. Neck: Supple, no JVD, carotid bruits, or masses. Cardiac: RRR, 3/6 murmur, no rubs, or gallops. No clubbing, cyanosis, edema.  Radials/DP/PT 2+ and equal bilaterally.  R Radial cath site with bruising, no bleeding, bruit, or hematoma. Respiratory:  Respirations regular and unlabored, clear to auscultation bilaterally. GI: Soft, nontender, nondistended, BS + x 4. MS: no deformity or atrophy. Skin: warm and dry, no rash. Neuro:  Strength and sensation are intact. Psych: Normal affect.  Accessory Clinical Findings    ECG personally reviewed by me today - EKG Interpretation Date/Time:  Thursday November 06 2023 08:33:02 EST Ventricular Rate:  68 PR Interval:  174 QRS Duration:  76 QT Interval:  384 QTC Calculation: 408 R Axis:   -9  Text Interpretation: Normal sinus rhythm Normal ECG When compared with ECG of 27-Oct-2023 12:10, No significant change was found Confirmed by Bernadene Person ([id number]) on 11/06/2023 8:38:21 AM  - no acute changes.   Lab Results  Component Value Date   WBC 11.7 (H) 10/29/2023   HGB 12.4 10/29/2023   HCT 38.1 10/29/2023   MCV 83.4 10/29/2023   PLT 387 10/29/2023   Lab Results  Component Value Date   CREATININE 0.77 10/29/2023   BUN 12 10/29/2023   NA 138 10/29/2023   K 3.2 (L) 10/29/2023   CL 105 10/29/2023   CO2 27 10/29/2023   Lab Results  Component Value Date   ALT 19 12/18/2022   AST 19 12/18/2022   ALKPHOS 92 12/18/2022   BILITOT 0.4 12/18/2022   Lab Results  Component Value Date   CHOL 174 10/29/2023   HDL 45 10/29/2023    LDLCALC 109 (H) 10/29/2023   TRIG 98 10/29/2023   CHOLHDL 3.9 10/29/2023    Lab Results  Component Value Date   HGBA1C 5.8 (H) 10/29/2023    Assessment & Plan   1. CAD: LHC on 10/28/2023 revealed mild to moderate non-obstructive disease (see details above). With no focal PCI targets, medical management of  NSTEMI was advised.   Echocardiogram showed EF 65 to 70%, normal LV function, no RWMA, LVH, normal RV systolic function, mild to moderate aortic valve stenosis, mean gradient 9.5 mmHg. It was felt that her NSTEMI was due to demand ischemia in the setting of hypertensive urgency.  For the first three days following her hospitalization she had constant dull aching chest discomfort that would last all day, no association with physical exertion.  She has not had any chest discomfort for the past 3 days.  She denies any other symptoms concerning for angina. Reviewed ED precautions. Continue ASA, Plavix, carvedilol, losartan, amlodipine, Imdur, and Lipitor.  2. Aortic stenosis: Moderate on most recent echo. Asymptomatic. Plan for repeat echo in 1 year (10/2024).   3. Hypertension: BP mildly elevated in office today (she did not take her medication this morning), has been generally well-controlled.  Will have her keep a BP log and report BP consider greater than 140/80.   Will check BMET today given new losartan. Continue current antihypertensive regimen.   4. Hyperlipidemia: LDL was 109 in 10/2023. Recently started on Lipitor. Plan for repeat fasting lipids, LFTs at next follow-up appt in 6 weeks.   5. History of CVA: Continue ASA, Plavix, Lipitor.   6. Abnormal TSH: TSH was 5.103 in 10/2023. Recommend follow-up with PCP.   7. Disposition: Follow-up in 6 weeks.   HYPERTENSION CONTROL Vitals:   11/06/23 0824 11/06/23 0858  BP: (!) 162/90 (!) 142/72    The patient's blood pressure is elevated above target today.  In order to address the patient's elevated BP: Blood pressure will be monitored  at home to determine if medication changes need to be made.; Follow up with general cardiology has been recommended.      Joylene Grapes, NP 11/06/2023, 9:04 AM

## 2023-11-06 NOTE — Patient Instructions (Signed)
Medication Instructions:  Your physician recommends that you continue on your current medications as directed. Please refer to the Current Medication list given to you today.  *If you need a refill on your cardiac medications before your next appointment, please call your pharmacy*   Lab Work: BMET today  Testing/Procedures: NONE ordered at this time of appointment    Follow-Up: At [facility], you and your health needs are our priority.  As part of our continuing mission to provide you with exceptional heart care, we have created designated Provider Care Teams.  These Care Teams include your primary Cardiologist (physician) and Advanced Practice Providers (APPs -  Physician Assistants and Nurse Practitioners) who all work together to provide you with the care you need, when you need it.  We recommend signing up for the patient portal called "MyChart".  Sign up information is provided on this After Visit Summary.  MyChart is used to connect with patients for Virtual Visits (Telemedicine).  Patients are able to view lab/test results, encounter notes, upcoming appointments, etc.  Non-urgent messages can be sent to your provider as well.   To learn more about what you can do with MyChart, go to [email or web address].    Your next appointment:   6 week(s)  Provider:   Bernadene Person, NP        Other Instructions Monitor Blood pressure. Report BP consistently greater than 140/80.

## 2023-11-07 ENCOUNTER — Other Ambulatory Visit (HOSPITAL_COMMUNITY): Payer: Self-pay

## 2023-11-07 ENCOUNTER — Other Ambulatory Visit (HOSPITAL_BASED_OUTPATIENT_CLINIC_OR_DEPARTMENT_OTHER): Payer: Self-pay

## 2023-11-13 ENCOUNTER — Other Ambulatory Visit (HOSPITAL_BASED_OUTPATIENT_CLINIC_OR_DEPARTMENT_OTHER): Payer: Self-pay

## 2023-11-13 ENCOUNTER — Ambulatory Visit: Payer: Medicare Other | Admitting: Nurse Practitioner

## 2023-11-13 ENCOUNTER — Encounter: Payer: Self-pay | Admitting: Nurse Practitioner

## 2023-11-13 ENCOUNTER — Other Ambulatory Visit: Payer: Self-pay

## 2023-11-13 VITALS — BP 134/82 | HR 77 | Wt 183.8 lb

## 2023-11-13 DIAGNOSIS — R918 Other nonspecific abnormal finding of lung field: Secondary | ICD-10-CM

## 2023-11-13 DIAGNOSIS — R7989 Other specified abnormal findings of blood chemistry: Secondary | ICD-10-CM

## 2023-11-13 DIAGNOSIS — I10 Essential (primary) hypertension: Secondary | ICD-10-CM

## 2023-11-13 DIAGNOSIS — K047 Periapical abscess without sinus: Secondary | ICD-10-CM

## 2023-11-13 DIAGNOSIS — I214 Non-ST elevation (NSTEMI) myocardial infarction: Secondary | ICD-10-CM | POA: Diagnosis not present

## 2023-11-13 DIAGNOSIS — I252 Old myocardial infarction: Secondary | ICD-10-CM | POA: Diagnosis not present

## 2023-11-13 DIAGNOSIS — E782 Mixed hyperlipidemia: Secondary | ICD-10-CM | POA: Insufficient documentation

## 2023-11-13 HISTORY — DX: Other specified abnormal findings of blood chemistry: R79.89

## 2023-11-13 HISTORY — DX: Mixed hyperlipidemia: E78.2

## 2023-11-13 HISTORY — DX: Other nonspecific abnormal finding of lung field: R91.8

## 2023-11-13 HISTORY — DX: Hypocalcemia: E83.51

## 2023-11-13 MED ORDER — CLINDAMYCIN HCL 300 MG PO CAPS
300.0000 mg | ORAL_CAPSULE | Freq: Three times a day (TID) | ORAL | 0 refills | Status: DC
  Filled 2023-11-13: qty 90, 30d supply, fill #0

## 2023-11-13 MED ORDER — CLONIDINE HCL 0.1 MG PO TABS
0.1000 mg | ORAL_TABLET | Freq: Three times a day (TID) | ORAL | 3 refills | Status: AC | PRN
  Filled 2023-11-13 – 2024-05-07 (×3): qty 30, 10d supply, fill #0
  Filled 2024-06-07: qty 30, 10d supply, fill #1

## 2023-11-13 MED ORDER — TRAMADOL HCL 50 MG PO TABS
50.0000 mg | ORAL_TABLET | Freq: Three times a day (TID) | ORAL | 0 refills | Status: DC | PRN
  Filled 2023-11-13: qty 30, 10d supply, fill #0

## 2023-11-13 NOTE — Assessment & Plan Note (Signed)
 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 Previously prescribed clindamycin  and ibuprofen, but unable to continue ibuprofen due to blood pressure elevations. Reports significant pain not controlled with tylenol , which may exacerbate hypertension. Discussed risks of ibuprofen and alternative pain management strategies. - Prescribe additional clindamycin  300 mg TID - Prescribe tramadol  for pain management - Advise minimizing ibuprofen use due to potential impact on blood pressure

## 2023-11-13 NOTE — Patient Instructions (Addendum)
 I will reach out to Dr. Court to ask him about the blood pressure medication and see if he wants me to make any changes. I will also ask when he wants to see you back to see him. I will also check about the chest pain to see if he needs to make adjustments to the imdur .   I have sent in clonidine  that you can take if your blood pressures are in the 170's or higher. You can take this up to every 8 hours.   We will schedule the lung CT in 3 months.

## 2023-11-13 NOTE — Assessment & Plan Note (Signed)
 Chronic hypertension with recent severe exacerbation. Current medications: amlodipine , isosorbide  mononitrate, losartan , and carvedilol . Blood pressures reportedly remaining in the 150's-160's. Reports significant fatigue, possibly related to carvedilol . Discussed regular blood pressure monitoring and potential medication adjustments. I will seek advice from Dr. Court for any changes to her routine, but will add PRN clonidine  for significant elevations.  - Continue current antihypertensive regimen - Monitor blood pressure regularly - Prescribe clonidine  for acute management of high BP (>170 and/or >90) - Check potassium levels

## 2023-11-13 NOTE — Assessment & Plan Note (Signed)
 Recent NSTEMI with elevated troponins, severe hypertension (BP 198/107), and chest pain radiating to the arm. Underwent cardiac catheterization without stent placement; mild stenosis noted. Reports intermittent chest discomfort and fatigue post-event. She is tolerating her medications and has no concerns about use. We discussed continued elevation in blood pressures despite new regimen. I would prefer to seek the advice of cardiology for any changes to her antihypertensive regimen to ensure the safest and most appropriate management provided. I will reach out to Dr. Court to discuss her blood pressure and determine if he would like me to make any changes prior to her next appointment with him. I will add PRN clonidine  for her to use to aid in reduction of blood pressure when readings are >170 and/or 90. - Prescribe clonidine  for acute hypertension management - Consult Dr. Court for cardiology management - Keep follow-up with Dr. Court - Advise to call 911 and go to [facility] if experiencing symptoms of another heart attack - Discuss potential emotional impact and offer support

## 2023-11-13 NOTE — Progress Notes (Signed)
 Julia Doing, DNP, AGNP-c [facility] [address] [place], [place] [address] Main Office [phone]  ESTABLISHED PATIENT- Hospital Follow-Up Visit  Blood pressure 134/82, pulse 77, weight 183 lb 12.8 oz (83.4 kg).  HPI  Julia Gutierrez  is a 68 y.o. year old female presenting today for evaluation and management following hospitalization.   Date of Hospitalization: 10/27/2023 Admitted: Yes Primary Diagnosis: Non STEMI New Medications: Yes: ASA, amlodipine , atorvastatin , carvedilol , clopidogrel , isosorbide  mononitrate, losartan  New Providers: Yes: Dr Court, cardiology Speciality F/U Scheduled: Yes- completed Lab/Imaging Concerns that need further evaluation: No  Julia Gutierrez tells me today: History of Present Illness Julia Gutierrez reports she had experienced chest pain and feeling unwell for a couple of days with blood pressure readings around 198/107 mmHg, prompting her visit to the emergency room on January 20th.. She describes a burning sensation that ran down her arm, shoulder pain, and chest tightness. Despite being on valsartan , amlodipine , and hydrochlorothiazide , her blood pressure remained high. At the hospital, elevated troponin levels and EKG indicated a N-STEMI. She was admitted and later underwent cardiac catheterization, which showed some stenosis but no significant blockage requiring stenting.  She has a history of hypertension and coronary artery disease. She tells me she has learned that she cannot manage without a diuretic as she had previously stopped taking hydrochlorothiazide , which she believes contributed to some of the elevation in her blood pressure and subsequent cardiac event. She feels very tired, with blood pressures now between 145 to 157 mmHg. She does experience some dizziness, especially when lying down, but this is her usual baseline in the setting of prolonged vertigo with Ramsay Hunt.   She reports a history of thyroid  issues, with a high TSH noted  during her recent hospitalization. She was on thyroid  medication many years ago but stopped due to subsequent normal test results. She is concerned about her thyroid  function and mentions her daughters' concerns about her thyroid  levels.  She describes feeling cold and experiences fatigue that she associates with her current medication regimen. She has been taking clindamycin  and tramadol  for dental pain and infection, and she is cautious about mixing medications. She is aware to avoid NSAIDs, which has been her pain management of choice until now.   She notes a significant family history of cardiac issues, with her mother having had twelve heart attacks and her brother passing away, after which her mother's heart 'rotated and broke'.  ROS All ROS negative with exception of what is listed in HPI  PHYSICAL EXAM Physical Exam Vitals and nursing note reviewed.  Constitutional:      General: She is not in acute distress.    Appearance: Normal appearance.  HENT:     Head: Normocephalic.  Eyes:     Conjunctiva/sclera: Conjunctivae normal.  Neck:     Vascular: No carotid bruit.  Cardiovascular:     Rate and Rhythm: Normal rate and regular rhythm.     Pulses: Normal pulses.     Heart sounds: Normal heart sounds. No murmur heard. Pulmonary:     Effort: Pulmonary effort is normal.     Breath sounds: Normal breath sounds.  Musculoskeletal:     Cervical back: No tenderness.     Right lower leg: No edema.     Left lower leg: No edema.  Lymphadenopathy:     Cervical: No cervical adenopathy.  Skin:    General: Skin is warm and dry.     Capillary Refill: Capillary refill takes less than 2 seconds.  Neurological:  General: No focal deficit present.     Mental Status: She is alert and oriented to person, place, and time.     Motor: No weakness.  Psychiatric:        Mood and Affect: Mood normal.        Behavior: Behavior normal.      ASSESSMENT & PLAN Problem List Items Addressed  This Visit     Tooth infection   Ongoing dental pain and infection related to sicca syndrome. Previously prescribed clindamycin  and ibuprofen, but unable to continue ibuprofen due to blood pressure elevations. Reports significant pain not controlled with tylenol , which may exacerbate hypertension. Discussed risks of ibuprofen and alternative pain management strategies. - Prescribe additional clindamycin  300 mg TID - Prescribe tramadol  for pain management - Advise minimizing ibuprofen use due to potential impact on blood pressure      Relevant Medications   traMADol  (ULTRAM ) 50 MG tablet   clindamycin  (CLEOCIN ) 300 MG capsule   Primary hypertension - Primary   Chronic hypertension with recent severe exacerbation. Current medications: amlodipine , isosorbide  mononitrate, losartan , and carvedilol . Blood pressures reportedly remaining in the 150's-160's. Reports significant fatigue, possibly related to carvedilol . Discussed regular blood pressure monitoring and potential medication adjustments. I will seek advice from Dr. Court for any changes to her routine, but will add PRN clonidine  for significant elevations.  - Continue current antihypertensive regimen - Monitor blood pressure regularly - Prescribe clonidine  for acute management of high BP (>170 and/or >90) - Check potassium levels      Relevant Medications   cloNIDine  (CATAPRES ) 0.1 MG tablet   History of non-ST elevation myocardial infarction (NSTEMI)   Recent NSTEMI with elevated troponins, severe hypertension (BP 198/107), and chest pain radiating to the arm. Underwent cardiac catheterization without stent placement; mild stenosis noted. Reports intermittent chest discomfort and fatigue post-event. She is tolerating her medications and has no concerns about use. We discussed continued elevation in blood pressures despite new regimen. I would prefer to seek the advice of cardiology for any changes to her antihypertensive regimen to ensure  the safest and most appropriate management provided. I will reach out to Dr. Court to discuss her blood pressure and determine if he would like me to make any changes prior to her next appointment with him. I will add PRN clonidine  for her to use to aid in reduction of blood pressure when readings are >170 and/or 90. - Prescribe clonidine  for acute hypertension management - Consult Dr. Court for cardiology management - Keep follow-up with Dr. Court - Advise to call 911 and go to [facility] if experiencing symptoms of another heart attack - Discuss potential emotional impact and offer support      Relevant Orders   Hemoglobin A1c   CBC with Differential/Platelet   Comprehensive metabolic panel   TSH   T4, free   Elevated TSH   Elevated TSH noted during recent hospitalization. Previously on thyroid  medication but discontinued years ago. Discussed need to reassess thyroid  function and potential treatment options. - Order TSH and free T4 tests to reassess thyroid  function      Relevant Orders   TSH   T4, free   Hypocalcemia   Relevant Orders   TSH   Mixed hyperlipidemia   Relevant Medications   cloNIDine  (CATAPRES ) 0.1 MG tablet   Other Relevant Orders   T4, free   Pulmonary nodules/lesions, multiple   Incidental finding of pulmonary nodules on recent CT scan. No immediate concern but requires monitoring. Discussed  typical benign nature and plan for follow-up imaging. - Order repeat lung CT in 3 months to monitor nodule size and changes      Relevant Orders   CT Chest Wo Contrast   Other Visit Diagnoses       Acute non-ST elevation myocardial infarction (NSTEMI) (HCC)       Relevant Medications   cloNIDine  (CATAPRES ) 0.1 MG tablet   Other Relevant Orders   Hemoglobin A1c       FOLLOW-UP Follow-up - Follow up with Dr. Court for cardiology management - Schedule follow-up appointment for blood pressure and thyroid  function reassessment - Monitor and report any  new or worsening symptoms. - Provide documentation for handicap placard  SaraBeth Jenner Rosier, DNP, AGNP-c  Time: 62 minutes, >50% spent counseling, care coordination, chart review, and documentation.

## 2023-11-13 NOTE — Assessment & Plan Note (Signed)
 Incidental finding of pulmonary nodules on recent CT scan. No immediate concern but requires monitoring. Discussed typical benign nature and plan for follow-up imaging. - Order repeat lung CT in 3 months to monitor nodule size and changes

## 2023-11-13 NOTE — Assessment & Plan Note (Signed)
>>  ASSESSMENT AND PLAN FOR ELEVATED TSH WRITTEN ON 11/13/2023  7:17 PM BY Delanna Blacketer E, NP  Elevated TSH noted during recent hospitalization. Previously on thyroid  medication but discontinued years ago. Discussed need to reassess thyroid  function and potential treatment options. - Order TSH and free T4 tests to reassess thyroid  function

## 2023-11-13 NOTE — Assessment & Plan Note (Signed)
 Elevated TSH noted during recent hospitalization. Previously on thyroid  medication but discontinued years ago. Discussed need to reassess thyroid  function and potential treatment options. - Order TSH and free T4 tests to reassess thyroid  function

## 2023-11-14 ENCOUNTER — Other Ambulatory Visit (HOSPITAL_BASED_OUTPATIENT_CLINIC_OR_DEPARTMENT_OTHER): Payer: Self-pay

## 2023-11-14 LAB — CBC WITH DIFFERENTIAL/PLATELET
Basophils Absolute: 0.2 10*3/uL (ref 0.0–0.2)
Basos: 2 %
EOS (ABSOLUTE): 1 10*3/uL — ABNORMAL HIGH (ref 0.0–0.4)
Eos: 8 %
Hematocrit: 36.7 % (ref 34.0–46.6)
Hemoglobin: 12 g/dL (ref 11.1–15.9)
Immature Grans (Abs): 0 10*3/uL (ref 0.0–0.1)
Immature Granulocytes: 0 %
Lymphocytes Absolute: 3 10*3/uL (ref 0.7–3.1)
Lymphs: 25 %
MCH: 27.4 pg (ref 26.6–33.0)
MCHC: 32.7 g/dL (ref 31.5–35.7)
MCV: 84 fL (ref 79–97)
Monocytes Absolute: 1 10*3/uL — ABNORMAL HIGH (ref 0.1–0.9)
Monocytes: 8 %
Neutrophils Absolute: 7 10*3/uL (ref 1.4–7.0)
Neutrophils: 57 %
Platelets: 414 10*3/uL (ref 150–450)
RBC: 4.38 x10E6/uL (ref 3.77–5.28)
RDW: 14.2 % (ref 11.7–15.4)
WBC: 12.3 10*3/uL — ABNORMAL HIGH (ref 3.4–10.8)

## 2023-11-14 LAB — COMPREHENSIVE METABOLIC PANEL
ALT: 17 [IU]/L (ref 0–32)
AST: 13 [IU]/L (ref 0–40)
Albumin: 4 g/dL (ref 3.9–4.9)
Alkaline Phosphatase: 105 [IU]/L (ref 44–121)
BUN/Creatinine Ratio: 39 — ABNORMAL HIGH (ref 12–28)
BUN: 27 mg/dL (ref 8–27)
Bilirubin Total: 0.4 mg/dL (ref 0.0–1.2)
CO2: 20 mmol/L (ref 20–29)
Calcium: 9.4 mg/dL (ref 8.7–10.3)
Chloride: 103 mmol/L (ref 96–106)
Creatinine, Ser: 0.69 mg/dL (ref 0.57–1.00)
Globulin, Total: 3.1 g/dL (ref 1.5–4.5)
Glucose: 90 mg/dL (ref 70–99)
Potassium: 3.8 mmol/L (ref 3.5–5.2)
Sodium: 139 mmol/L (ref 134–144)
Total Protein: 7.1 g/dL (ref 6.0–8.5)
eGFR: 94 mL/min/{1.73_m2} (ref >59)

## 2023-11-14 LAB — TSH: TSH: 2.34 u[IU]/mL (ref 0.450–4.500)

## 2023-11-14 LAB — HEMOGLOBIN A1C
Est. average glucose Bld gHb Est-mCnc: 117 mg/dL
Hgb A1c MFr Bld: 5.7 % — ABNORMAL HIGH (ref 4.8–5.6)

## 2023-11-14 LAB — T4, FREE: Free T4: 1.11 ng/dL (ref 0.82–1.77)

## 2023-11-17 ENCOUNTER — Other Ambulatory Visit: Payer: Self-pay

## 2023-11-17 ENCOUNTER — Other Ambulatory Visit (HOSPITAL_BASED_OUTPATIENT_CLINIC_OR_DEPARTMENT_OTHER): Payer: Self-pay

## 2023-11-18 ENCOUNTER — Other Ambulatory Visit: Payer: Self-pay

## 2023-11-18 ENCOUNTER — Encounter: Payer: Self-pay | Admitting: Physician Assistant

## 2023-11-18 ENCOUNTER — Other Ambulatory Visit (HOSPITAL_BASED_OUTPATIENT_CLINIC_OR_DEPARTMENT_OTHER): Payer: Self-pay

## 2023-11-19 ENCOUNTER — Encounter: Payer: Self-pay | Admitting: Physician Assistant

## 2023-11-19 ENCOUNTER — Encounter: Payer: Self-pay | Admitting: Nurse Practitioner

## 2023-11-20 ENCOUNTER — Encounter: Payer: Self-pay | Admitting: Psychology

## 2023-11-20 DIAGNOSIS — F32A Depression, unspecified: Secondary | ICD-10-CM | POA: Insufficient documentation

## 2023-11-20 DIAGNOSIS — I6381 Other cerebral infarction due to occlusion or stenosis of small artery: Secondary | ICD-10-CM | POA: Insufficient documentation

## 2023-11-20 DIAGNOSIS — F4321 Adjustment disorder with depressed mood: Secondary | ICD-10-CM | POA: Insufficient documentation

## 2023-11-20 DIAGNOSIS — F329 Major depressive disorder, single episode, unspecified: Secondary | ICD-10-CM | POA: Insufficient documentation

## 2023-11-20 DIAGNOSIS — F411 Generalized anxiety disorder: Secondary | ICD-10-CM | POA: Insufficient documentation

## 2023-11-21 ENCOUNTER — Encounter: Payer: Self-pay | Admitting: Psychology

## 2023-11-21 ENCOUNTER — Ambulatory Visit: Payer: Medicare Other

## 2023-11-21 ENCOUNTER — Ambulatory Visit (INDEPENDENT_AMBULATORY_CARE_PROVIDER_SITE_OTHER): Payer: Medicare Other | Admitting: Psychology

## 2023-11-21 DIAGNOSIS — R41840 Attention and concentration deficit: Secondary | ICD-10-CM | POA: Diagnosis not present

## 2023-11-21 DIAGNOSIS — I679 Cerebrovascular disease, unspecified: Secondary | ICD-10-CM | POA: Insufficient documentation

## 2023-11-21 DIAGNOSIS — R4189 Other symptoms and signs involving cognitive functions and awareness: Secondary | ICD-10-CM

## 2023-11-21 DIAGNOSIS — I6381 Other cerebral infarction due to occlusion or stenosis of small artery: Secondary | ICD-10-CM

## 2023-11-21 NOTE — Progress Notes (Signed)
   Psychometrician Note   Cognitive testing was administered to Becky Augusta by Shan Levans, B.S. (psychometrist) under the supervision of Dr. Newman Nickels, Ph.D., ABPP, licensed psychologist on 11/21/2023. Ms. Macaulay did not appear overtly distressed by the testing session per behavioral observation or responses across self-report questionnaires. Rest breaks were offered.    The battery of tests administered was selected by Dr. Newman Nickels, Ph.D., ABPP with consideration to Ms. Snider's current level of functioning, the nature of her symptoms, emotional and behavioral responses during interview, level of literacy, observed level of motivation/effort, and the nature of the referral question. This battery was communicated to the psychometrist. Communication between Dr. Newman Nickels, Ph.D., ABPP and the psychometrist was ongoing throughout the evaluation and Dr. Newman Nickels, Ph.D., ABPP was immediately accessible at all times. Dr. Newman Nickels, Ph.D., ABPP provided supervision to the psychometrist on the date of this service to the extent necessary to assure the quality of all services provided.    Erandy Mceachern will return within approximately 1-2 weeks for an interactive feedback session with Dr. Milbert Coulter at which time her test performances, clinical impressions, and treatment recommendations will be reviewed in detail. Ms. Skillman understands she can contact our office should she require our assistance before this time.  A total of 115 minutes of billable time were spent face-to-face with Ms. Blanck by the psychometrist. This includes both test administration and scoring time. Billing for these services is reflected in the clinical report generated by Dr. Newman Nickels, Ph.D., ABPP  This note reflects time spent with the psychometrician and does not include test scores or any clinical interpretations made by Dr. Milbert Coulter. The full report will follow in a separate note.

## 2023-11-21 NOTE — Progress Notes (Signed)
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 [id number] and three additional units [id number]. Psychometrist Shan Levans, B.S. assisted Dr. Milbert Coulter with test administration and scoring procedures. As a separate and discrete service, one unit [id number] and two units [phone] were billed for Dr. Tammy Sours time spent in interpretation and report writing.

## 2023-11-22 ENCOUNTER — Encounter: Payer: Self-pay | Admitting: Nurse Practitioner

## 2023-11-24 ENCOUNTER — Telehealth: Payer: Self-pay | Admitting: Nurse Practitioner

## 2023-11-24 NOTE — Telephone Encounter (Signed)
Pt c/o BP issue: STAT if pt c/o blurred vision, one-sided weakness or slurred speech  1. What are your last 5 BP readings?   Not available at this time  2. Are you having any other symptoms (ex. Dizziness, headache, blurred vision, passed out)?   No  3. What is your BP issue?   Patient is concerned her BP has been trending high and she wants a call back to discuss next steps.

## 2023-11-24 NOTE — Telephone Encounter (Signed)
 Left message to call back

## 2023-11-25 ENCOUNTER — Encounter: Payer: Self-pay | Admitting: Nurse Practitioner

## 2023-11-25 ENCOUNTER — Encounter: Payer: Self-pay | Admitting: Psychology

## 2023-11-25 ENCOUNTER — Ambulatory Visit (INDEPENDENT_AMBULATORY_CARE_PROVIDER_SITE_OTHER): Payer: Medicare Other | Admitting: Psychology

## 2023-11-25 DIAGNOSIS — R41840 Attention and concentration deficit: Secondary | ICD-10-CM | POA: Diagnosis not present

## 2023-11-25 DIAGNOSIS — I6381 Other cerebral infarction due to occlusion or stenosis of small artery: Secondary | ICD-10-CM

## 2023-11-25 NOTE — Telephone Encounter (Signed)
Called pt to address concerns. Appt moved up to tomorrow. Pt grateful for moving appointment.

## 2023-11-25 NOTE — Telephone Encounter (Signed)
 2nd attempt to call patient, no answer left message requesting a call back.

## 2023-11-25 NOTE — Progress Notes (Signed)
   Neuropsychology Feedback Session Eligha Bridegroom. [facility] Department of Neurology  Reason for Referral:   Jaz Laningham is a 68 y.o. right-handed Caucasian female referred by Marlowe Kays, PA-C, to characterize her current cognitive functioning and assist with diagnostic clarity and treatment planning in the context of subjective cognitive decline.   Feedback:   Ms. Bilyeu completed a comprehensive neuropsychological evaluation on 11/21/2023. Please refer to that encounter for the full report and recommendations. Briefly, results suggested neuropsychological functioning within normal limits relative to age-matched peers. An isolated weakness was exhibited across a single task assessing executive functioning (i.e., TMT B); however, all other tasks were normatively appropriate, thus not suggestive of any consistent impairment. Performances were overall appropriate across all assessed domains. This includes processing speed, attention/concentration, executive functioning, receptive and expressive language, visuospatial abilities, and all aspects of learning and memory. She does not warrant any consideration for an underlying neurocognitive disorder at the present time.   Ms. Mccarver was unaccompanied during the current telephone call. She was within her residence while I was within my office. I discussed the limitations of evaluation and management by telemedicine and the availability of in person appointments. Ms. Wolanski expressed her understanding and agreed to proceed. Content of the current session focused on the results of her neuropsychological evaluation. Ms. Bodner was given the opportunity to ask questions and her questions were answered. She was encouraged to reach out should additional questions arise. A copy of her report was is available to her on MyChart.      One unit [phone] was billed for Dr. Tammy Sours time spent preparing for, conducting, and documenting the  current feedback session with Ms. Osmon.

## 2023-11-26 ENCOUNTER — Ambulatory Visit: Payer: Medicare Other | Admitting: Nurse Practitioner

## 2023-11-26 NOTE — Telephone Encounter (Signed)
 3rd attempt to call patient, no answer, left message requesting a call back Nursing will await for patient to return call

## 2023-11-27 ENCOUNTER — Encounter: Payer: Medicare Other | Admitting: Psychology

## 2023-11-28 ENCOUNTER — Other Ambulatory Visit (HOSPITAL_BASED_OUTPATIENT_CLINIC_OR_DEPARTMENT_OTHER): Payer: Self-pay

## 2023-11-28 ENCOUNTER — Ambulatory Visit: Payer: Medicare Other | Admitting: Physician Assistant

## 2023-12-01 ENCOUNTER — Ambulatory Visit: Payer: Medicare Other | Admitting: Physician Assistant

## 2023-12-01 ENCOUNTER — Encounter: Payer: Self-pay | Admitting: Nurse Practitioner

## 2023-12-03 ENCOUNTER — Encounter: Payer: Self-pay | Admitting: Family Medicine

## 2023-12-03 ENCOUNTER — Ambulatory Visit: Payer: Medicare Other | Admitting: Family Medicine

## 2023-12-03 VITALS — BP 138/84 | HR 66 | Temp 98.4°F | Wt 181.6 lb

## 2023-12-03 DIAGNOSIS — K5900 Constipation, unspecified: Secondary | ICD-10-CM | POA: Diagnosis not present

## 2023-12-03 DIAGNOSIS — R82998 Other abnormal findings in urine: Secondary | ICD-10-CM | POA: Diagnosis not present

## 2023-12-03 LAB — POCT URINALYSIS DIP (PROADVANTAGE DEVICE)
Bilirubin, UA: NEGATIVE
Blood, UA: NEGATIVE
Glucose, UA: NEGATIVE mg/dL
Ketones, POC UA: NEGATIVE mg/dL
Leukocytes, UA: NEGATIVE
Nitrite, UA: NEGATIVE
Protein Ur, POC: 30 mg/dL — AB
Specific Gravity, Urine: 1.02
Urobilinogen, Ur: 0.2
pH, UA: 6 (ref 5.0–8.0)

## 2023-12-03 NOTE — Progress Notes (Signed)
   Subjective:    Patient ID: Julia Gutierrez, female    DOB: 01/01/1956, 68 y.o.   MRN: [id number]  HPI She is here for consult concerning dark urine.  She is wondering if this was related to dehydration.  No urgency, frequency, dysuria fever or chills.  She also continues have difficulty with constipation and has been going on for quite some time.  She intermittently uses a fiber prep to help with her constipation.  Review of the record indicates that she has Sjogren's type symptoms but the diagnosis is not been fully verified.   Review of Systems     Objective:    Physical Exam Alert and in no distress.  Her urinalysis was essentially negative with a specific gravity of 1.020       Assessment & Plan:  Dark urine  Constipation, unspecified constipation type I explained that the dark urine was probably more related to hydration and encouraged her to increase her fluid intake to try and get her urine to become slightly lighter in color.  I then discussed constipation with her in terms of fluid status and also recommended potentially using her fiber product on a regular basis or switching to MiraLAX for better control of her constipation.

## 2023-12-09 ENCOUNTER — Other Ambulatory Visit: Payer: Medicare Other

## 2023-12-10 ENCOUNTER — Ambulatory Visit: Payer: Medicare Other | Admitting: Nurse Practitioner

## 2023-12-18 ENCOUNTER — Other Ambulatory Visit (HOSPITAL_BASED_OUTPATIENT_CLINIC_OR_DEPARTMENT_OTHER): Payer: Self-pay

## 2023-12-18 ENCOUNTER — Encounter: Payer: Self-pay | Admitting: Nurse Practitioner

## 2023-12-18 ENCOUNTER — Ambulatory Visit: Payer: Medicare Other | Admitting: Nurse Practitioner

## 2023-12-18 ENCOUNTER — Ambulatory Visit: Payer: Medicare Other | Attending: Nurse Practitioner | Admitting: Nurse Practitioner

## 2023-12-18 VITALS — BP 142/88 | HR 97 | Ht 62.0 in | Wt 180.0 lb

## 2023-12-18 DIAGNOSIS — Z8673 Personal history of transient ischemic attack (TIA), and cerebral infarction without residual deficits: Secondary | ICD-10-CM | POA: Diagnosis present

## 2023-12-18 DIAGNOSIS — E785 Hyperlipidemia, unspecified: Secondary | ICD-10-CM | POA: Diagnosis present

## 2023-12-18 DIAGNOSIS — I350 Nonrheumatic aortic (valve) stenosis: Secondary | ICD-10-CM

## 2023-12-18 DIAGNOSIS — I10 Essential (primary) hypertension: Secondary | ICD-10-CM | POA: Diagnosis present

## 2023-12-18 DIAGNOSIS — I25118 Atherosclerotic heart disease of native coronary artery with other forms of angina pectoris: Secondary | ICD-10-CM

## 2023-12-18 MED ORDER — ISOSORBIDE MONONITRATE ER 60 MG PO TB24
60.0000 mg | ORAL_TABLET | Freq: Every day | ORAL | 11 refills | Status: AC
  Filled 2023-12-18: qty 30, 30d supply, fill #0
  Filled 2024-01-23: qty 30, 30d supply, fill #1
  Filled 2024-02-03 – 2024-03-07 (×4): qty 30, 30d supply, fill #2
  Filled 2024-04-09: qty 30, 30d supply, fill #3
  Filled 2024-05-07: qty 30, 30d supply, fill #4
  Filled 2024-06-07: qty 30, 30d supply, fill #5
  Filled 2024-07-12: qty 30, 30d supply, fill #6
  Filled 2024-08-06: qty 30, 30d supply, fill #7
  Filled 2024-09-09: qty 30, 30d supply, fill #8
  Filled 2024-10-09 (×2): qty 30, 30d supply, fill #9
  Filled 2024-11-07: qty 30, 30d supply, fill #10

## 2023-12-18 NOTE — Progress Notes (Signed)
 Office Visit    Patient Name: Julia Gutierrez Date of Encounter: 12/18/2023  Primary Care Provider:  Tollie Eth, NP Primary Cardiologist:  Nanetta Batty, MD  Chief Complaint    68 year old female with a history of CAD, aortic stenosis, palpitations, hypertension, hyperlipidemia, CVA, hypothyroidism, anxiety, depression, and chronic fatigue who presents for follow-up related to CAD and hypertension.   Past Medical History    Past Medical History:  Diagnosis Date   Acute cystitis without hematuria 08/28/2021   Allergy 2002   Atherosclerosis of aorta 10/29/2022   Attention deficit hyperactivity disorder (ADHD), predominantly inattentive type 07/03/2023   Cerebrovascular disease    Cervical spondylosis 05/04/2021   Cervicogenic headache 04/13/2021   Chronic fatigue 04/13/2021   Chronic vertigo 04/13/2021   Colicky RUQ abdominal pain 02/24/2022   Dehydration 05/20/2023   Elevated TSH 11/13/2023   Enlarged glands 06/18/2021   Generalized anxiety disorder    Heart murmur    History of COVID-19    History of non-ST elevation myocardial infarction (NSTEMI) 10/27/2023   Hypocalcemia 11/13/2023   Hypothyroidism    IBS (irritable bowel syndrome)    Increased thirst 04/13/2021   Lumbar pain 12/23/2022   Migraine with status migrainosus, not intractable 05/04/2021   Mixed hyperlipidemia 11/13/2023   Mouth dryness 04/13/2021   Multiple lacunar infarcts    Chronic lacunar infarcts within the bilateral cerebral hemispheric white matter and dorsolateral left thalamus   Muscle spasms of neck 05/20/2023   Palpitations 04/13/2021   Periumbilical abdominal pain 03/07/2023   Primary hypertension 09/17/2022   Primary insomnia 07/03/2023   Pulmonary nodules/lesions, multiple 11/13/2023   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   Rib pain on right side 12/04/2021   Situational depression    seasonal component   Sjogren syndrome with dental involvement 04/13/2021   Skin lesion  10/29/2022   Tooth infection 05/04/2021   Vertigo 04/13/2021   Vision changes 04/13/2021   Vitamin B12 deficiency 02/24/2022   Past Surgical History:  Procedure Laterality Date   CHOLECYSTECTOMY     LEFT HEART CATH AND CORONARY ANGIOGRAPHY N/A 10/28/2023   Procedure: LEFT HEART CATH AND CORONARY ANGIOGRAPHY;  Surgeon: Kathleene Hazel, MD;  Location: [facility] INVASIVE CV [facility];  Service: Cardiovascular;  Laterality: N/A;    Allergies  Allergies  Allergen Reactions   Promethazine Other (See Comments)    Muscle spasms      Labs/Other Studies Reviewed    The following studies were reviewed today:  Cardiac Studies & Procedures   ______________________________________________________________________________________________ CARDIAC CATHETERIZATION  CARDIAC CATHETERIZATION 10/28/2023  Narrative   Prox RCA to Mid RCA lesion is 20% stenosed.   Dist RCA lesion is 60% stenosed.   RPAV lesion is 70% stenosed.   1st Mrg lesion is 40% stenosed.   Prox Cx to Mid Cx lesion is 60% stenosed.   Ost Cx to Prox Cx lesion is 30% stenosed.   Mid LAD lesion is 20% stenosed.  Large caliber LAD that courses to the apex. Mild plaque in the mid vessel Moderate caliber Circumflex with moderate caliber obtuse marginal branch. Mild to moderate non-obstructive disease in the obtuse marginal branch. Moderate disease in the continuation of the small caliber AV groove Circumflex. Large dominant RCA with heavy calcification throughout the proximal through distal vessel. Moderate stenosis mid vessel (60% with heavy calcification). Normal LV systolic function LVEDP=9 mmHg.  Recommendations: She has no focal targets for PCI. There is a moderately stenotic, heavily calcified lesion in the mid to distal RCA. This does  not appear to be flow limiting. Her presentation is most likely consistent with demand ischemia in the setting of elevated blood pressure and moderate underlying CAD. I will add Plavix and Imdur.  Titration of medications as needed for BP control prior to discharge. If she has recurrent angina despite optimal medical therapy, could consider PCI of the RCA with orbital atherectomy. I would plan right femoral access for her next procedure.  Findings Coronary Findings Diagnostic  Dominance: Right  Left Anterior Descending Vessel is large. Mid LAD lesion is 20% stenosed.  Left Circumflex Vessel is moderate in size. Ost Cx to Prox Cx lesion is 30% stenosed. Prox Cx to Mid Cx lesion is 60% stenosed.  First Obtuse Marginal Branch 1st Mrg lesion is 40% stenosed.  Right Coronary Artery Vessel is large. Prox RCA to Mid RCA lesion is 20% stenosed. The lesion is calcified. Dist RCA lesion is 60% stenosed. The lesion is calcified.  Right Posterior Atrioventricular Artery RPAV lesion is 70% stenosed.  Intervention  No interventions have been documented.     ECHOCARDIOGRAM  ECHOCARDIOGRAM COMPLETE 10/29/2023  Narrative ECHOCARDIOGRAM REPORT    Patient Name:   Julia Gutierrez Date of Exam: 10/29/2023 Medical Rec #:  [id number]      Height:       62.0 in Accession #:    [id number]     Weight:       173.3 lb Date of Birth:  03/03/1956       BSA:          1.799 m Patient Age:    67 years       BP:           107/63 mmHg Patient Gender: F              HR:           72 bpm. Exam Location:  Inpatient  Procedure: 2D Echo, Cardiac Doppler and Color Doppler  Indications:    Chest Pain R07.9  History:        Patient has no prior history of Echocardiogram examinations. Previous Myocardial Infarction, Migraine; Risk Factors:Hypertension.  Sonographer:    Lucendia Herrlich RCS Referring Phys: [id number] Perlie Gold  IMPRESSIONS   1. Left ventricular ejection fraction, by estimation, is 65 to 70%. The left ventricle has normal function. The left ventricle has no regional wall motion abnormalities. There is mild left ventricular hypertrophy. Left ventricular diastolic parameters were  normal. 2. Right ventricular systolic function is normal. The right ventricular size is normal. There is mildly elevated pulmonary artery systolic pressure. The estimated right ventricular systolic pressure is 36.9 mmHg. 3. The mitral valve is degenerative. Trivial mitral valve regurgitation. No evidence of mitral stenosis. 4. The aortic valve is calcified. There is moderate calcification of the aortic valve. Aortic valve regurgitation is not visualized. Mild to moderate aortic valve stenosis. Mild AS by gradients (Vmax 2.4 m/s, MG ), moderate by AVA (1.0cm^2) and DI (0.38) 5. The inferior vena cava is dilated in size with <50% respiratory variability, suggesting right atrial pressure of 15 mmHg.  FINDINGS Left Ventricle: Left ventricular ejection fraction, by estimation, is 65 to 70%. The left ventricle has normal function. The left ventricle has no regional wall motion abnormalities. The left ventricular internal cavity size was normal in size. There is mild left ventricular hypertrophy. Left ventricular diastolic parameters were normal.  Right Ventricle: The right ventricular size is normal. No increase in right ventricular wall thickness. Right ventricular systolic function is normal.  There is mildly elevated pulmonary artery systolic pressure. The tricuspid regurgitant velocity is 2.34 m/s, and with an assumed right atrial pressure of 15 mmHg, the estimated right ventricular systolic pressure is 36.9 mmHg.  Left Atrium: Left atrial size was normal in size.  Right Atrium: Right atrial size was normal in size.  Pericardium: There is no evidence of pericardial effusion.  Mitral Valve: The mitral valve is degenerative in appearance. Trivial mitral valve regurgitation. No evidence of mitral valve stenosis.  Tricuspid Valve: The tricuspid valve is normal in structure. Tricuspid valve regurgitation is trivial.  Aortic Valve: The aortic valve is calcified. There is moderate calcification  of the aortic valve. Aortic valve regurgitation is not visualized. Mild to moderate aortic stenosis is present. Aortic valve mean gradient measures 9.5 mmHg. Aortic valve peak gradient measures 18.9 mmHg. Aortic valve area, by VTI measures 1.12 cm.  Pulmonic Valve: The pulmonic valve was not well visualized. Pulmonic valve regurgitation is not visualized.  Aorta: The aortic root and ascending aorta are structurally normal, with no evidence of dilitation.  Venous: The inferior vena cava is dilated in size with less than 50% respiratory variability, suggesting right atrial pressure of 15 mmHg.  IAS/Shunts: No atrial level shunt detected by color flow Doppler.   LEFT VENTRICLE PLAX 2D LVIDd:         4.30 cm   Diastology LVIDs:         2.70 cm   LV e' medial:    9.46 cm/s LV PW:         1.00 cm   LV E/e' medial:  7.7 LV IVS:        1.00 cm   LV e' lateral:   9.14 cm/s LVOT diam:     1.80 cm   LV E/e' lateral: 7.9 LV SV:         49 LV SV Index:   27 LVOT Area:     2.54 cm   RIGHT VENTRICLE             IVC RV S prime:     12.70 cm/s  IVC diam: 2.20 cm TAPSE (M-mode): 2.3 cm  LEFT ATRIUM             Index        RIGHT ATRIUM           Index LA diam:        3.80 cm 2.11 cm/m   RA Area:     16.50 cm LA Vol (A2C):   44.4 ml 24.69 ml/m  RA Volume:   38.90 ml  21.63 ml/m LA Vol (A4C):   46.9 ml 26.07 ml/m LA Biplane Vol: 46.5 ml 25.85 ml/m AORTIC VALVE AV Area (Vmax):    1.15 cm AV Area (Vmean):   1.12 cm AV Area (VTI):     1.12 cm AV Vmax:           217.50 cm/s AV Vmean:          142.000 cm/s AV VTI:            0.435 m AV Peak Grad:      18.9 mmHg AV Mean Grad:      9.5 mmHg LVOT Vmax:         98.50 cm/s LVOT Vmean:        62.300 cm/s LVOT VTI:          0.192 m LVOT/AV VTI ratio: 0.44  AORTA Ao Root diam: 2.90 cm Ao  Asc diam:  3.70 cm  MITRAL VALVE               TRICUSPID VALVE MV Area (PHT): 2.39 cm    TR Peak grad:   21.9 mmHg MV Decel Time: 317 msec    TR  Vmax:        234.00 cm/s MV E velocity: 72.40 cm/s MV A velocity: 97.00 cm/s  SHUNTS MV E/A ratio:  0.75        Systemic VTI:  0.19 m Systemic Diam: 1.80 cm  Epifanio Lesches MD Electronically signed by Epifanio Lesches MD Signature Date/Time: 10/29/2023/4:47:16 PM    Final          ______________________________________________________________________________________________     Recent Labs: 11/13/2023: ALT 17; BUN 27; Creatinine, Ser 0.69; Hemoglobin 12.0; Platelets 414; Potassium 3.8; Sodium 139; TSH 2.340  Recent Lipid Panel    Component Value Date/Time   CHOL 174 10/29/2023 0246   CHOL 189 02/20/2022 0954   TRIG 98 10/29/2023 0246   HDL 45 10/29/2023 0246   HDL 53 02/20/2022 0954   CHOLHDL 3.9 10/29/2023 0246   VLDL 20 10/29/2023 0246   LDLCALC 109 (H) 10/29/2023 0246   LDLCALC 118 (H) 02/20/2022 0954    History of Present Illness    68 year old female with the above past medical history including CAD, aortic stenosis, palpitations, hypertension, hyperlipidemia, CVA, hypothyroidism, anxiety, depression, and chronic fatigue.   Prior echocardiogram in 06/2022 in the setting of CVA showed EF 60 to 65%, no RWMA, mild AS, mild MR.  She presented to the ED on 10/27/2023 in the setting of chest pain that radiated into her left arm and back.  Blood pressure was elevated 213/99.  Troponin was elevated.  CT of the chest/abdomen/pelvis showed scattered atherosclerotic plaque, no dissection or aneurysm, coronary artery calcifications, hiatal hernia, juxtapleural small lung nodules. Repeat noncontrast CT was recommended in 3 to 6 months. Cardiology was consulted in the setting of NSTEMI. LHC on 1/21 revealed large caliber LAD that courses to the apex. Mild plaque in the mid vessel. Moderate caliber Circumflex with moderate caliber obtuse marginal branch. Mild to moderate non-obstructive disease in the obtuse marginal branch. Moderate disease in the continuation of the small  caliber AV groove Circumflex. Large dominant RCA with heavy calcification throughout the proximal through distal vessel. Moderate stenosis mid vessel (60% with heavy calcification). With no focal PCI targets, medical management of NSTEMI was advised.  It was felt that her NSTEMI was due to demand ischemia in the setting of hypertensive urgency.  She was started on DAPT with aspirin and Plavix in addition to Imdur, Lipitor. Echocardiogram showed EF 65 to 70%, normal LV function, no RWMA, LVH, normal RV systolic function, mild to moderate aortic valve stenosis, mean gradient 9.5 mmHg. She was last seen in the office on 11/06/2023 and was doing well.  She denied symptoms concerning for angina.   She presents today for follow-up accompanied by her daughter.  Since her last visit she reports feeling "terrible." She is "exhausted" and "feels bad all over."  She has had intermittent "angina." Apparently she contacted our office for a sooner appointment, after sending a message to triage regarding her symptoms.  I never received report of her symptoms, however, an appointment was scheduled, but was canceled due to inclement weather.  She will note a burning sensation in her chest, similar to her symptoms that prompted her hospitalization in January, symptoms are not associated with exertion, and can last for "up to  2 days."  She reports rare fleeting palpitations, stable nonpitting bilateral lower extremity edema. She denies any dizziness, dyspnea, edema, PND, orthopnea, weight gain.  Home Medications    Current Outpatient Medications  Medication Sig Dispense Refill   acetaminophen (TYLENOL) 500 MG tablet Take 1,000 mg by mouth 2 (two) times daily as needed for moderate pain (pain score 4-6), fever or headache.     amLODipine (NORVASC) 5 MG tablet Take 1 tablet (5 mg total) by mouth at bedtime. 90 tablet 0   aspirin EC 81 MG tablet Take 1 tablet (81 mg total) by mouth daily. Swallow whole. 90 tablet 0    atorvastatin (LIPITOR) 80 MG tablet Take 1 tablet (80 mg total) by mouth daily. 90 tablet 0   carvedilol (COREG) 3.125 MG tablet Take 1 tablet (3.125 mg total) by mouth 2 (two) times daily with a meal. 180 tablet 0   clindamycin (CLEOCIN) 300 MG capsule Take 1 capsule (300 mg total) by mouth 3 (three) times daily. 90 capsule 0   cloNIDine (CATAPRES) 0.1 MG tablet Take 1 tablet (0.1 mg total) by mouth 3 (three) times daily as needed. 30 tablet 3   clopidogrel (PLAVIX) 75 MG tablet Take 1 tablet (75 mg total) by mouth daily. 90 tablet 0   gabapentin (NEURONTIN) 100 MG capsule Take 1 capsule (100 mg total) by mouth 2 (two) times daily AND 3 capsules (300 mg total) at bedtime. Use as needed for neuropathic pain. (Patient taking differently: Take 2-3 capsules (200-300mg ) by mouth at bedtime.) 150 capsule 3   isosorbide mononitrate (IMDUR) 60 MG 24 hr tablet Take 1 tablet (60 mg total) by mouth daily. 30 tablet 11   losartan (COZAAR) 25 MG tablet Take 1 tablet (25 mg total) by mouth daily. 90 tablet 0   traMADol (ULTRAM) 50 MG tablet Take 1 tablet (50 mg total) by mouth every 8 (eight) hours as needed. 30 tablet 0   Vitamin D, Ergocalciferol, (DRISDOL) 1.25 MG (50000 UNIT) CAPS capsule Take 1 capsule (50,000 Units total) by mouth every 7 (seven) days. (Patient taking differently: Take 50,000 Units by mouth every Sunday.) 12 capsule 3   No current facility-administered medications for this visit.     Review of Systems    She denies dyspnea, pnd, orthopnea, n, v, dizziness, syncope, weight gain, or early satiety. All other systems reviewed and are otherwise negative except as noted above.   Physical Exam    VS:  BP (!) 142/88   Pulse 97   Ht 5\' 2"  (1.575 m)   Wt 180 lb (81.6 kg)   SpO2 97%   BMI 32.92 kg/m  GEN: Well nourished, well developed, in no acute distress. HEENT: normal. Neck: Supple, no JVD, carotid bruits, or masses. Cardiac: RRR, 3/6 murmur, no  rubs, or gallops. No clubbing,  cyanosis, nonpitting bilateral lower extremity edema.  Radials/DP/PT 2+ and equal bilaterally.  Respiratory:  Respirations regular and unlabored, clear to auscultation bilaterally. GI: Soft, nontender, nondistended, BS + x 4. MS: no deformity or atrophy. Skin: warm and dry, no rash. Neuro:  Strength and sensation are intact. Psych: Normal affect.  Accessory Clinical Findings    ECG personally reviewed by me today -    - no EKG in office today.   Lab Results  Component Value Date   WBC 12.3 (H) 11/13/2023   HGB 12.0 11/13/2023   HCT 36.7 11/13/2023   MCV 84 11/13/2023   PLT 414 11/13/2023   Lab Results  Component Value Date  CREATININE 0.69 11/13/2023   BUN 27 11/13/2023   NA 139 11/13/2023   K 3.8 11/13/2023   CL 103 11/13/2023   CO2 20 11/13/2023   Lab Results  Component Value Date   ALT 17 11/13/2023   AST 13 11/13/2023   ALKPHOS 105 11/13/2023   BILITOT 0.4 11/13/2023   Lab Results  Component Value Date   CHOL 174 10/29/2023   HDL 45 10/29/2023   LDLCALC 109 (H) 10/29/2023   TRIG 98 10/29/2023   CHOLHDL 3.9 10/29/2023    Lab Results  Component Value Date   HGBA1C 5.7 (H) 11/13/2023    Assessment & Plan    1. CAD: LHC on 10/28/2023 revealed mild to moderate non-obstructive disease (see details above). With no focal PCI targets, medical management of NSTEMI was advised.   Echocardiogram showed EF 65 to 70%, normal LV function, no RWMA, LVH, normal RV systolic function, mild to moderate aortic valve stenosis, mean gradient 9.5 mmHg. It was felt that her NSTEMI was due to demand ischemia in the setting of hypertensive urgency.   no association with physical exertion.  Today she reports feeling "terrible." She is "exhausted" and "feels bad all over."  She has had intermittent "angina." She will note a burning sensation in her chest, similar to her symptoms that prompted her hospitalization in January, symptoms are not associated with exertion, and can last for "up to  2 days."  Some days she has no symptoms at all.  Will increase Imdur to 60 mg daily.  Reviewed ED precautions. Continue ASA, Plavix, carvedilol, losartan, amlodipine, and Lipitor.   2. Aortic stenosis: Moderate on most recent echo.  She has stable nonpitting bilateral lower extremity edema, otherwise asymptomatic, generally euvolemic and well compensated exam. Plan for repeat echo in 1 year (10/2024).    3. Hypertension: BP mildly elevated in office today. Will have her keep a BP log and report BP consider greater than 140/80.  Will increase Imdur as above, otherwise, continue current antihypertensive regimen.    4. Hyperlipidemia: LDL was 109 in 10/2023. Recently started on Lipitor. She reports feeling "terrible." She is "exhausted" and "feels bad all over." Advised 2-week statin holiday.  She will update [place] as to her symptoms.  If symptoms improve with statin holiday, consider trialing low-dose rosuvastatin.  If she does not tolerate statin therapy, would recommend referral to lipid clinic Pharm.D. for consideration of alternative lipid-lowering therapy.  Will update fasting lipid panel, CMET.  5. History of CVA: Continue ASA, Plavix, Lipitor.    6. Abnormal TSH: TSH was 5.103 in 10/2023.  T4 was normal.  Recommend follow-up with PCP.    7. Disposition: Follow-up in 6 weeks, sooner if needed.  HYPERTENSION CONTROL Vitals:   12/18/23 0842 12/18/23 0941  BP: (!) 140/78 (!) 142/88    The patient's blood pressure is elevated above target today.  In order to address the patient's elevated BP: Blood pressure will be monitored at home to determine if medication changes need to be made.; A current anti-hypertensive medication was adjusted today.; Follow up with general cardiology has been recommended.      Joylene Grapes, NP 12/18/2023, 10:19 AM

## 2023-12-18 NOTE — Patient Instructions (Addendum)
 Medication Instructions:  Increase Imdur 60 mg daily Hold Atorvastatin 80 mg for 2 weeks and call back with a report of symptoms.   *If you need a refill on your cardiac medications before your next appointment, please call your pharmacy*   Lab Work: Fasting Lipid panel & CMET at your convenience.  Testing/Procedures: NONE ordered at this time of appointment.  Follow-Up: At [facility], you and your health needs are our priority.  As part of our continuing mission to provide you with exceptional heart care, we have created designated Provider Care Teams.  These Care Teams include your primary Cardiologist (physician) and Advanced Practice Providers (APPs -  Physician Assistants and Nurse Practitioners) who all work together to provide you with the care you need, when you need it.  We recommend signing up for the patient portal called "MyChart".  Sign up information is provided on this After Visit Summary.  MyChart is used to connect with patients for Virtual Visits (Telemedicine).  Patients are able to view lab/test results, encounter notes, upcoming appointments, etc.  Non-urgent messages can be sent to your provider as well.   To learn more about what you can do with MyChart, go to [email or web address].    Your next appointment:   6 week(s)  Provider:   Bernadene Person, NP        Other Instructions Monitor blood pressure. Report blood pressure consistently greater than 140/80.

## 2023-12-26 ENCOUNTER — Other Ambulatory Visit (HOSPITAL_BASED_OUTPATIENT_CLINIC_OR_DEPARTMENT_OTHER): Payer: Self-pay

## 2023-12-26 ENCOUNTER — Other Ambulatory Visit: Payer: Self-pay

## 2023-12-31 LAB — COMPREHENSIVE METABOLIC PANEL
ALT: 21 IU/L (ref 0–32)
AST: 24 IU/L (ref 0–40)
Albumin: 3.8 g/dL — ABNORMAL LOW (ref 3.9–4.9)
Alkaline Phosphatase: 575 IU/L — ABNORMAL HIGH (ref 44–121)
BUN/Creatinine Ratio: 14 (ref 12–28)
BUN: 11 mg/dL (ref 8–27)
Bilirubin Total: 0.5 mg/dL (ref 0.0–1.2)
CO2: 22 mmol/L (ref 20–29)
Calcium: 9.3 mg/dL (ref 8.7–10.3)
Chloride: 106 mmol/L (ref 96–106)
Creatinine, Ser: 0.79 mg/dL (ref 0.57–1.00)
Globulin, Total: 3.3 g/dL (ref 1.5–4.5)
Glucose: 101 mg/dL — ABNORMAL HIGH (ref 70–99)
Potassium: 4.1 mmol/L (ref 3.5–5.2)
Sodium: 143 mmol/L (ref 134–144)
Total Protein: 7.1 g/dL (ref 6.0–8.5)
eGFR: 81 mL/min/{1.73_m2} (ref >59)

## 2023-12-31 LAB — LIPID PANEL
Chol/HDL Ratio: 3.7 ratio (ref 0.0–4.4)
Cholesterol, Total: 181 mg/dL (ref 100–199)
HDL: 49 mg/dL (ref >39)
LDL Chol Calc (NIH): 112 mg/dL — ABNORMAL HIGH (ref 0–99)
Triglycerides: 112 mg/dL (ref 0–149)
VLDL Cholesterol Cal: 20 mg/dL (ref 5–40)

## 2024-01-08 ENCOUNTER — Telehealth: Payer: Self-pay

## 2024-01-08 NOTE — Telephone Encounter (Addendum)
 Results viewed by patient via My Chart.----- Message from Joylene Grapes sent at 01/06/2024  6:33 AM EDT ----- Recent labs show stable kidney function and electrolytes, your alkaline phosphatase is elevated.  Your cholesterol is elevated above LDL goal of <70.  Recommend holding statin. If agreeable, recommend referral to lipid clinic Pharm.D. to discuss nonstatin lipid-lowering therapy.  Recommend follow-up with PCP regarding abnormal alkaline phosphatase, which can indicate a possible abnormality with liver function. Thank you -EM

## 2024-01-19 ENCOUNTER — Ambulatory Visit: Admitting: Nurse Practitioner

## 2024-01-20 ENCOUNTER — Other Ambulatory Visit (HOSPITAL_BASED_OUTPATIENT_CLINIC_OR_DEPARTMENT_OTHER): Payer: Self-pay

## 2024-01-20 ENCOUNTER — Other Ambulatory Visit: Payer: Self-pay | Admitting: Nurse Practitioner

## 2024-01-20 DIAGNOSIS — B0221 Postherpetic geniculate ganglionitis: Secondary | ICD-10-CM

## 2024-01-20 DIAGNOSIS — H8113 Benign paroxysmal vertigo, bilateral: Secondary | ICD-10-CM

## 2024-01-21 ENCOUNTER — Other Ambulatory Visit (HOSPITAL_BASED_OUTPATIENT_CLINIC_OR_DEPARTMENT_OTHER): Payer: Self-pay

## 2024-01-21 MED ORDER — GABAPENTIN 100 MG PO CAPS
ORAL_CAPSULE | ORAL | 1 refills | Status: DC
Start: 2024-01-21 — End: 2024-07-21
  Filled 2024-01-21 – 2024-01-23 (×2): qty 150, 30d supply, fill #0
  Filled 2024-03-23: qty 150, 30d supply, fill #1

## 2024-01-23 ENCOUNTER — Other Ambulatory Visit (HOSPITAL_BASED_OUTPATIENT_CLINIC_OR_DEPARTMENT_OTHER): Payer: Self-pay

## 2024-01-23 MED ORDER — CARVEDILOL 3.125 MG PO TABS
3.1250 mg | ORAL_TABLET | Freq: Two times a day (BID) | ORAL | 0 refills | Status: DC
  Filled 2024-01-23: qty 180, 90d supply, fill #0

## 2024-01-23 MED ORDER — LOSARTAN POTASSIUM 25 MG PO TABS
25.0000 mg | ORAL_TABLET | Freq: Every day | ORAL | 0 refills | Status: DC
  Filled 2024-01-23 (×2): qty 90, 90d supply, fill #0

## 2024-01-23 MED ORDER — AMLODIPINE BESYLATE 5 MG PO TABS
5.0000 mg | ORAL_TABLET | Freq: Every day | ORAL | 0 refills | Status: DC
  Filled 2024-01-23 – 2024-04-13 (×3): qty 90, 90d supply, fill #0

## 2024-01-23 MED ORDER — CLOPIDOGREL BISULFATE 75 MG PO TABS
75.0000 mg | ORAL_TABLET | Freq: Every day | ORAL | 0 refills | Status: DC
  Filled 2024-01-23: qty 90, 90d supply, fill #0

## 2024-02-02 ENCOUNTER — Ambulatory Visit: Admitting: Nurse Practitioner

## 2024-02-04 ENCOUNTER — Other Ambulatory Visit (HOSPITAL_BASED_OUTPATIENT_CLINIC_OR_DEPARTMENT_OTHER): Payer: Self-pay

## 2024-02-06 ENCOUNTER — Other Ambulatory Visit (HOSPITAL_BASED_OUTPATIENT_CLINIC_OR_DEPARTMENT_OTHER): Payer: Self-pay

## 2024-02-10 ENCOUNTER — Encounter: Payer: Medicare Other | Admitting: Nurse Practitioner

## 2024-02-19 ENCOUNTER — Ambulatory Visit: Admitting: Nurse Practitioner

## 2024-03-01 ENCOUNTER — Other Ambulatory Visit: Payer: Self-pay | Admitting: Nurse Practitioner

## 2024-03-01 DIAGNOSIS — K047 Periapical abscess without sinus: Secondary | ICD-10-CM

## 2024-03-02 ENCOUNTER — Other Ambulatory Visit (HOSPITAL_BASED_OUTPATIENT_CLINIC_OR_DEPARTMENT_OTHER): Payer: Self-pay

## 2024-03-02 MED ORDER — CLINDAMYCIN HCL 300 MG PO CAPS
300.0000 mg | ORAL_CAPSULE | Freq: Three times a day (TID) | ORAL | 0 refills | Status: DC
  Filled 2024-03-02: qty 90, 30d supply, fill #0

## 2024-03-06 ENCOUNTER — Other Ambulatory Visit (HOSPITAL_BASED_OUTPATIENT_CLINIC_OR_DEPARTMENT_OTHER): Payer: Self-pay

## 2024-03-07 ENCOUNTER — Encounter: Payer: Self-pay | Admitting: Nurse Practitioner

## 2024-03-20 ENCOUNTER — Other Ambulatory Visit (HOSPITAL_BASED_OUTPATIENT_CLINIC_OR_DEPARTMENT_OTHER): Payer: Self-pay

## 2024-03-22 ENCOUNTER — Other Ambulatory Visit (HOSPITAL_BASED_OUTPATIENT_CLINIC_OR_DEPARTMENT_OTHER): Payer: Self-pay

## 2024-03-23 ENCOUNTER — Other Ambulatory Visit (HOSPITAL_BASED_OUTPATIENT_CLINIC_OR_DEPARTMENT_OTHER): Payer: Self-pay

## 2024-04-01 ENCOUNTER — Ambulatory Visit: Admitting: Nurse Practitioner

## 2024-04-02 ENCOUNTER — Other Ambulatory Visit (HOSPITAL_BASED_OUTPATIENT_CLINIC_OR_DEPARTMENT_OTHER): Payer: Self-pay

## 2024-04-11 ENCOUNTER — Telehealth: Admitting: Family Medicine

## 2024-04-11 DIAGNOSIS — I10 Essential (primary) hypertension: Secondary | ICD-10-CM | POA: Diagnosis not present

## 2024-04-11 DIAGNOSIS — I209 Angina pectoris, unspecified: Secondary | ICD-10-CM | POA: Diagnosis not present

## 2024-04-11 NOTE — Patient Instructions (Signed)
 Isosorbide  Mononitrate Extended-Release Tablets What is this medication? ISOSORBIDE  MONONITRATE (eye soe SOR bide mon oh NYE trate) prevents chest pain (angina). It works by relaxing blood vessels, which decreases the amount of work the heart has to do. It belongs to a group of medications called nitrates. Do not use it to treat sudden chest pain. This medicine may be used for other purposes; ask your health care provider or pharmacist if you have questions. COMMON BRAND NAME(S): Imdur , Isotrate ER What should I tell my care team before I take this medication? They need to know if you have any of these conditions: Previous heart attack or heart failure An unusual or allergic reaction to isosorbide  mononitrate, nitrates, other medications, foods, dyes, or preservatives Pregnant or trying to get pregnant Breast-feeding How should I use this medication? Take this medication by mouth with a glass of water. Follow the directions on the prescription label. Do not crush or chew. Take your medication at regular intervals. Do not take your medication more often than directed. Do not stop taking this medication except on the advice of your care team. Talk to your care team about the use of this medication in children. Special care may be needed. Overdosage: If you think you have taken too much of this medicine contact a poison control center or emergency room at once. NOTE: This medicine is only for you. Do not share this medicine with others. What if I miss a dose? If you miss a dose, take it as soon as you can. If it is almost time for your next dose, take only that dose. Do not take double or extra doses. What may interact with this medication? Do not take this medication with any of the following: Certain medications for erectile dysfunction (ED), such as avanafil, sildenafil, tadalafil, or vardenafil Riociguat This medication may also interact with the following: Medications for high blood  pressure Other medications for angina or heart failure This list may not describe all possible interactions. Give your health care provider a list of all the medicines, herbs, non-prescription drugs, or dietary supplements you use. Also tell them if you smoke, drink alcohol, or use illegal drugs. Some items may interact with your medicine. What should I watch for while using this medication? Visit your care team for regular checks on your progress. Check your heart rate and blood pressure as directed. Know what your heart rate and blood pressure should be and when to contact your care team. Tell your care team if you feel your medication is no longer working. This medication may affect your coordination, reaction time, or judgment. Do not drive or operate machinery until you know how this medication affects you. Sit up or stand slowly to reduce the risk of dizzy or fainting spells. Drinking alcohol with this medication can increase the risk of these side effects Do not treat yourself for coughs, colds, or pain while you are taking this medication without asking your care team for advice. Some medications may increase your blood pressure. What side effects may I notice from receiving this medication? Side effects that you should report to your care team as soon as possible: Allergic reactions--skin rash, itching, hives, swelling of the face, lips, tongue, or throat Headache, unusual weakness or fatigue, shortness of breath, nausea, vomiting, rapid heartbeat, blue skin or lips, which may be signs of methemoglobinemia Increased pressure around the brain--severe headache, blurry vision, change in vision, nausea, vomiting Low blood pressure--dizziness, feeling faint or lightheaded, blurry vision Slow heartbeat--dizziness,  feeling faint or lightheaded, confusion, trouble breathing, unusual weakness or fatigue Worsening chest pain (angina)--pain, pressure, or tightness in the chest, neck, back, or arms Side  effects that usually do not require medical attention (report to your care team if they continue or are bothersome): Dizziness Flushing Headache This list may not describe all possible side effects. Call your doctor for medical advice about side effects. You may report side effects to FDA at 1-800-FDA-1088. Where should I keep my medication? Keep out of the reach of children. Store between 15 and 30 degrees C (59 and 86 degrees F). Keep container tightly closed. Throw away any unused medication after the expiration date. NOTE: This sheet is a summary. It may not cover all possible information. If you have questions about this medicine, talk to your doctor, pharmacist, or health care provider.  2024 Elsevier/Gold Standard (2022-04-01 00:00:00)

## 2024-04-11 NOTE — Progress Notes (Signed)
 Virtual Visit Consent   [name], you are scheduled for a virtual visit with a [facility] provider today. Just as with appointments in the office, your consent must be obtained to participate. Your consent will be active for this visit and any virtual visit you may have with one of our providers in the next 365 days. If you have a MyChart account, a copy of this consent can be sent to you electronically.  As this is a virtual visit, video technology does not allow for your provider to perform a traditional examination. This may limit your provider's ability to fully assess your condition. If your provider identifies any concerns that need to be evaluated in person or the need to arrange testing (such as labs, EKG, etc.), we will make arrangements to do so. Although advances in technology are sophisticated, we cannot ensure that it will always work on either your end or our end. If the connection with a video visit is poor, the visit may have to be switched to a telephone visit. With either a video or telephone visit, we are not always able to ensure that we have a secure connection.  By engaging in this virtual visit, you consent to the provision of healthcare and authorize for your insurance to be billed (if applicable) for the services provided during this visit. Depending on your insurance coverage, you may receive a charge related to this service.  I need to obtain your verbal consent now. Are you willing to proceed with your visit today? Julia Gutierrez has provided verbal consent on 04/11/2024 for a virtual visit (video or telephone). Loa Lamp, FNP  Date: 04/11/2024 10:26 AM   Virtual Visit via Video Note   I, Loa Lamp, connected with  Julia Gutierrez  ([id number], Sep 20, 1956) on 04/11/24 at 10:30 AM EDT by a video-enabled telemedicine application and verified that I am speaking with the correct person using two identifiers.  Location: Patient: Virtual Visit Location Patient:  Home Provider: Virtual Visit Location Provider: Home Office   I discussed the limitations of evaluation and management by telemedicine and the availability of in person appointments. The patient expressed understanding and agreed to proceed.    History of Present Illness: Julia Gutierrez is a 68 y.o. who identifies as a female who was assigned female at birth, and is being seen today for being out of imdur . She denies chest pain, sob, and dizziness. She wants to wait to pick up in the am due to cost. She is asymptomatic.SABRA  HPI: HPI  Problems:  Patient Active Problem List   Diagnosis Date Noted   Cerebrovascular disease    Situational depression    Generalized anxiety disorder    Multiple lacunar infarcts    Elevated TSH 11/13/2023   Hypocalcemia 11/13/2023   Mixed hyperlipidemia 11/13/2023   Pulmonary nodules/lesions, multiple 11/13/2023   History of non-ST elevation myocardial infarction (NSTEMI) 10/27/2023   Attention deficit hyperactivity disorder (ADHD), predominantly inattentive type 07/03/2023   Primary insomnia 07/03/2023   Muscle spasms of neck 05/20/2023   Lumbar pain 12/23/2022   Atherosclerosis of aorta 10/29/2022   History of COVID-19    Primary hypertension 09/17/2022   Vitamin B12 deficiency 02/24/2022   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, Disp: 90 tablet, Rfl: 0    atorvastatin  (LIPITOR ) 80 MG tablet, Take 1 tablet (80 mg total) by mouth daily., Disp: 90 tablet, Rfl: 0   carvedilol  (COREG ) 3.125 MG tablet, Take 1 tablet (3.125 mg total) by mouth 2 (two) times daily with a meal., Disp: 180 tablet, Rfl: 0   clindamycin  (CLEOCIN ) 300 MG capsule, Take 1 capsule (300 mg total) by mouth 3 (three) times daily., Disp: 90 capsule, Rfl: 0   cloNIDine  (CATAPRES ) 0.1 MG tablet, Take 1 tablet (0.1 mg total) by mouth 3 (three) times daily as needed., Disp: 30 tablet, Rfl: 3   clopidogrel  (PLAVIX ) 75 MG tablet, Take 1 tablet (75 mg total) by mouth daily., Disp: 90 tablet, Rfl: 0   gabapentin  (NEURONTIN ) 100 MG capsule, Take 1 capsule (100 mg total) by mouth 2 (two) times daily AND 3 capsules (300 mg total) at bedtime. Use as needed for neuropathic pain., Disp: 150 capsule, Rfl: 1   isosorbide  mononitrate (IMDUR ) 60 MG 24 hr tablet, Take 1 tablet (60 mg total) by mouth daily., Disp: 30 tablet, Rfl: 11   losartan  (COZAAR ) 25 MG tablet, Take 1 tablet (25 mg total) by mouth daily., Disp: 90 tablet, Rfl: 0   traMADol  (ULTRAM ) 50 MG tablet, Take 1 tablet (50 mg total) by mouth every 8 (eight) hours as needed., Disp: 30 tablet, Rfl: 0   Vitamin D , Ergocalciferol , (DRISDOL ) 1.25 MG (50000 UNIT) CAPS capsule, Take 1 capsule (50,000 Units total) by mouth every 7 (seven) days. (Patient taking differently: Take 50,000 Units by mouth every Sunday.), Disp: 12 capsule, Rfl: 3  Observations/Objective: Patient is well-developed, well-nourished in no acute distress.  Resting comfortably  at home.  Head is normocephalic, atraumatic.  No labored breathing.  Speech is clear and coherent with logical content.  Patient is alert and oriented at baseline.    Assessment and Plan: There are no diagnoses linked to this encounter. Get meds filled in the am If she changes her mind we can send meds today. Denies problems.   Follow Up Instructions: I discussed the assessment and treatment plan  with the patient. The patient was provided an opportunity to ask questions and all were answered. The patient agreed with the plan and demonstrated an understanding of the instructions.  A copy of instructions were sent to the patient via MyChart unless otherwise noted below.     The patient was advised to call back or seek an in-person evaluation if the symptoms worsen or if the condition fails to improve as anticipated.    Julia Tessler, FNP v

## 2024-04-12 ENCOUNTER — Other Ambulatory Visit (HOSPITAL_BASED_OUTPATIENT_CLINIC_OR_DEPARTMENT_OTHER): Payer: Self-pay

## 2024-04-13 ENCOUNTER — Other Ambulatory Visit (HOSPITAL_BASED_OUTPATIENT_CLINIC_OR_DEPARTMENT_OTHER): Payer: Self-pay

## 2024-04-19 ENCOUNTER — Other Ambulatory Visit: Payer: Self-pay | Admitting: Nurse Practitioner

## 2024-04-22 ENCOUNTER — Other Ambulatory Visit (HOSPITAL_BASED_OUTPATIENT_CLINIC_OR_DEPARTMENT_OTHER): Payer: Self-pay

## 2024-04-22 ENCOUNTER — Other Ambulatory Visit: Payer: Self-pay

## 2024-04-22 MED ORDER — CLOPIDOGREL BISULFATE 75 MG PO TABS
75.0000 mg | ORAL_TABLET | Freq: Every day | ORAL | 2 refills | Status: AC
  Filled 2024-04-22: qty 90, 90d supply, fill #0
  Filled 2024-07-31: qty 90, 90d supply, fill #1
  Filled 2024-11-07: qty 90, 90d supply, fill #2

## 2024-04-22 MED ORDER — CARVEDILOL 3.125 MG PO TABS
3.1250 mg | ORAL_TABLET | Freq: Two times a day (BID) | ORAL | 2 refills | Status: DC
  Filled 2024-04-22: qty 180, 90d supply, fill #0

## 2024-04-22 MED ORDER — LOSARTAN POTASSIUM 25 MG PO TABS
25.0000 mg | ORAL_TABLET | Freq: Every day | ORAL | 2 refills | Status: DC
  Filled 2024-04-22 – 2024-04-29 (×2): qty 90, 90d supply, fill #0

## 2024-04-22 MED ORDER — AMLODIPINE BESYLATE 5 MG PO TABS
5.0000 mg | ORAL_TABLET | Freq: Every day | ORAL | 2 refills | Status: AC
  Filled 2024-04-22 – 2024-07-12 (×2): qty 90, 90d supply, fill #0
  Filled 2024-10-12: qty 90, 90d supply, fill #1

## 2024-04-29 ENCOUNTER — Other Ambulatory Visit: Payer: Self-pay

## 2024-04-29 ENCOUNTER — Other Ambulatory Visit (HOSPITAL_BASED_OUTPATIENT_CLINIC_OR_DEPARTMENT_OTHER): Payer: Self-pay

## 2024-05-05 NOTE — Progress Notes (Unsigned)
 Julia Doing, DNP, AGNP-c [facility]  [address] [place], [place] [address] [phone]  ESTABLISHED PATIENT- Chronic Health and/or Follow-Up Visit  Blood pressure (!) 144/90, pulse 77, weight 184 lb 12.8 oz (83.8 kg).    History of Present Illness Julia Gutierrez is a 68 year old female who presents with depression and weight management concerns.  She feels overwhelmed and experiences depression, attributing it to personal and health-related challenges. She is concerned about delay in receiving mental health support after reaching out for help. She desires non-pharmacological interventions for her depression, stating 'I want help' but she 'doesn't want a pill.'   She has a history of vertigo related to [facility] syndrome, which is exacerbated by certain environments, such as bright lights and certain movements, leading her to cancel her gym membership. This has impacted her ability to engage in physical activities that previously helped her manage stress and maintain her mental health.  She is concerned about her heart health, following her recent MI. She is cautious about physical exertion, not sure what is safe. She has not visited cardiology recently due to feeling overwhelmed and discouraged by previous experiences. She is open to revisiting, but prefers a provider that will listen and not 'talk over me'.  She reports significant weight gain and difficulty managing her weight, which she attributes to a sugar addiction. Sugar serves as a 'feel good, my pick me up' due to her exhaustion and inability to exercise as she used to. She has attempted weight loss in the past with programs like [employer], which she found effective but too expensive to maintain.   She describes ongoing issues with her mouth and teeth, including constant infections and sensitivity, related to Sjogren's syndrome. This has made it difficult for her to eat a healthy diet, as many foods cause  discomfort. She has seen rheumatology in the past and was prescribed hydroxychloroquine  for trial, but she had concerns about the side effects of the medication and did not start. She has not been back to see rheumatology for follow-up.   She mentions a red nose that developed over the past three months, which does not hurt but has progressively become more noticeable. She is concerned about potential underlying causes, including autoimmune conditions, given her family history and personal health issues.  She reports increased thirst and urination, raising concerns about her blood sugar levels. No pain associated with the redness of her nose. Her social history includes a supportive family, with children who are successful and willing to help her financially, although she expresses discomfort with relying on them. She lives in an apartment complex with access to outdoor spaces for walking, which she does regularly with her dog. She walks at least 20 minutes twice a day at home and has a job where she is active on her feet for most of the day.    Flowsheet Row Office Visit from 05/06/2024 in [facility]  PHQ-9 Total Score 15     All ROS negative with exception of what is listed above.   PHYSICAL EXAM Physical Exam Vitals and nursing note reviewed.  Constitutional:      General: She is not in acute distress.    Appearance: Normal appearance. She is not ill-appearing.  HENT:     Head: Normocephalic.  Eyes:     Pupils: Pupils are equal, round, and reactive to light.  Neck:     Vascular: No carotid bruit.  Cardiovascular:     Rate and Rhythm: Normal  rate and regular rhythm.     Pulses: Normal pulses.     Heart sounds: Normal heart sounds.  Pulmonary:     Effort: Pulmonary effort is normal.     Breath sounds: Normal breath sounds.  Musculoskeletal:     Right lower leg: No edema.     Left lower leg: No edema.  Skin:    General: Skin is warm and dry.     Capillary Refill:  Capillary refill takes less than 2 seconds.  Neurological:     Mental Status: She is alert and oriented to person, place, and time.     Sensory: No sensory deficit.     Motor: No weakness.  Psychiatric:        Attention and Perception: Attention normal.        Mood and Affect: Affect is tearful.        Speech: Speech normal.        Behavior: Behavior is cooperative.        Thought Content: Thought content normal.        Cognition and Memory: Cognition and memory normal.        Judgment: Judgment normal.      PLAN Problem List Items Addressed This Visit     Chronic vertigo   Julia Gutierrez experiences chronic vertigo due to [employer] syndrome, which affects her ability to exercise and perform daily activities. Certain environments, like gyms with purple lights, exacerbate her vertigo.      Hypothyroidism   Repeat labs today for monitoring. Changes in mood and weight are concerning for possible changes in control. Will continue levothyroxine at the current dose for now.       Sjogren syndrome with dental involvement   Julia Gutierrez has chronic oral pain and dental issues, which are related to Sjogren's syndrome. She experiences sensitivity in her mouth, making it difficult to eat certain foods. Previous treatments like pilocarpine  provided minimal relief. - Consider methotrexate for autoimmune symptoms- I will review if this is a safe option.  - Check thyroid  function      Primary hypertension   Blood pressure is quite elevated in the office today. She is taking her medications. She has been monitoring her blood pressure at home and feels that a diuretic may help manage her blood pressure, as it was effective for her mother. It is likely that stress and weight gain are both contributors to the elevation in blood pressures recently.  - Check blood pressure at home - Take clonidine  if blood pressure is more than 130/80 mmHg - Consider adding a diuretic after lab review - continue current medications  for now.       Relevant Orders   AMB Referral [facility] Care Management   Hemoglobin A1c   TSH   CBC with Differential/Platelet   Comprehensive metabolic panel with GFR   T4, free   History of non-ST elevation myocardial infarction (NSTEMI)   Julia Gutierrez is concerned about her heart health and feels apprehensive about exercising due to her cardiac condition. She has not been back to cardiology for a while and feels uncomfortable with her current cardiology care. We discussed trying to transfer care and she is open to this. For now, plan to continue her current medications. I will review labs to determine if a diuretic would be contraindicated for blood pressure control.       Depression - Primary   Julia Gutierrez reports feeling overwhelmed and struggling with depression, exacerbated by recent life stressors including health, financial  and vehicle issues. She is Gutierrez better now but acknowledges the need for counseling to help manage her mental health. She does not want to take medications at this time, which is reasonable. I do feel that counseling is important to initiate quickly given her high PHQ numbers. No SI/HI. We discussed delays that can present with finding mental health professionals and I offered to place referral to [facility] for social work to offer support while we work to get her in with a Veterinary surgeon. She is interested in this. We can also see if there are resources that may help reduce her financial burden.  - Refer to licensed clinical social worker for counseling - Refer to psychology for counseling.       Relevant Orders   AMB Referral [facility] Care Management   Hemoglobin A1c   TSH   CBC with Differential/Platelet   Comprehensive metabolic panel with GFR   T4, free   Ambulatory referral to Psychology   BMI 33.0-33.9,adult   Julia Gutierrez is aware of her obesity and its impact on her health, including her heart and blood pressure. She is struggling with weight loss due to sugar cravings and feels  overwhelmed by her inability to exercise as she used to. She recognizes the need to lose weight to improve her health. - Check A1c to assess blood sugar levels - Provide handouts on high protein, low carb snacks      Prediabetes   Julia Gutierrez is experiencing intense sugar cravings, which may be related to insulin resistance. She is aware of the impact of her diet on her blood sugar levels and is seeking ways to manage her cravings and improve her diet. - Check A1c to assess blood sugar levels       Relevant Orders   AMB Referral [facility] Care Management   Hemoglobin A1c   TSH   CBC with Differential/Platelet   Comprehensive metabolic panel with GFR   T4, free   Redness   Julia Gutierrez has noticed redness on her nose for about three months, which has gradually increased. There is no pain associated with the redness, and it does not appear to be related to sun exposure or skin cancer. The redness may be related to autoimmune issues.      Other Visit Diagnoses       Elevated TSH       Relevant Orders   AMB Referral [facility] Care Management   Hemoglobin A1c   TSH   CBC with Differential/Platelet   Comprehensive metabolic panel with GFR   T4, free       Return for 4-6 weeks Mood- virtual OK- 45 if possible.  Julia Doing, DNP, AGNP-c

## 2024-05-06 ENCOUNTER — Ambulatory Visit (INDEPENDENT_AMBULATORY_CARE_PROVIDER_SITE_OTHER): Admitting: Nurse Practitioner

## 2024-05-06 ENCOUNTER — Encounter: Payer: Self-pay | Admitting: Nurse Practitioner

## 2024-05-06 VITALS — BP 144/90 | HR 77 | Wt 184.8 lb

## 2024-05-06 DIAGNOSIS — L539 Erythematous condition, unspecified: Secondary | ICD-10-CM | POA: Insufficient documentation

## 2024-05-06 DIAGNOSIS — Z6833 Body mass index (BMI) 33.0-33.9, adult: Secondary | ICD-10-CM | POA: Insufficient documentation

## 2024-05-06 DIAGNOSIS — I10 Essential (primary) hypertension: Secondary | ICD-10-CM | POA: Diagnosis not present

## 2024-05-06 DIAGNOSIS — I252 Old myocardial infarction: Secondary | ICD-10-CM

## 2024-05-06 DIAGNOSIS — M350C Sjogren syndrome with dental involvement: Secondary | ICD-10-CM

## 2024-05-06 DIAGNOSIS — R7303 Prediabetes: Secondary | ICD-10-CM | POA: Insufficient documentation

## 2024-05-06 DIAGNOSIS — F32A Depression, unspecified: Secondary | ICD-10-CM

## 2024-05-06 DIAGNOSIS — R7989 Other specified abnormal findings of blood chemistry: Secondary | ICD-10-CM | POA: Diagnosis not present

## 2024-05-06 DIAGNOSIS — E039 Hypothyroidism, unspecified: Secondary | ICD-10-CM

## 2024-05-06 DIAGNOSIS — R42 Dizziness and giddiness: Secondary | ICD-10-CM

## 2024-05-06 NOTE — Assessment & Plan Note (Signed)
 Julia Gutierrez experiences chronic vertigo due to [employer] syndrome, which affects her ability to exercise and perform daily activities. Certain environments, like gyms with purple lights, exacerbate her vertigo.

## 2024-05-06 NOTE — Assessment & Plan Note (Signed)
 Julia Gutierrez has chronic oral pain and dental issues, which are related to Sjogren's syndrome. She experiences sensitivity in her mouth, making it difficult to eat certain foods. Previous treatments like pilocarpine  provided minimal relief. - Consider methotrexate for autoimmune symptoms- I will review if this is a safe option.  - Check thyroid  function

## 2024-05-06 NOTE — Assessment & Plan Note (Signed)
 Julia Gutierrez is aware of her obesity and its impact on her health, including her heart and blood pressure. She is struggling with weight loss due to sugar cravings and feels overwhelmed by her inability to exercise as she used to. She recognizes the need to lose weight to improve her health. - Check A1c to assess blood sugar levels - Provide handouts on high protein, low carb snacks

## 2024-05-06 NOTE — Patient Instructions (Addendum)
 I will look into seeing if we can add a diuretic.   If your blood pressures are running high (>130/80) in the meantime please take the clonidine  as needed.  I will work to see if we can change the provider for cardiology.   Managing Depression, Adult Depression is a mental health condition that affects your thoughts, feelings, and actions. Being diagnosed with depression can bring you relief if you did not know why you have felt or behaved a certain way. It could also leave you feeling overwhelmed. Finding ways to manage your symptoms can help you feel more positive about your future. How to manage lifestyle changes Being depressed is difficult. Depression can increase the level of everyday stress. Stress can make depression symptoms worse. You may believe your symptoms cannot be managed or will never improve. However, there are many things you can try to help manage your symptoms. There is hope. Managing stress  Stress is your body's reaction to life changes and events, both good and bad. Stress can add to your feelings of depression. Learning to manage your stress can help lessen your feelings of depression. Try some of the following approaches to reducing your stress (stress reduction techniques): Listen to music that you enjoy and that inspires you. Try using a meditation app or take a meditation class. Develop a practice that helps you connect with your spiritual self. Walk in nature, pray, or go to a place of worship. Practice deep breathing. To do this, inhale slowly through your nose. Pause at the top of your inhale for a few seconds and then exhale slowly, letting yourself relax. Repeat this three or four times. Practice yoga to help relax and work your muscles. Choose a stress reduction technique that works for you. These techniques take time and practice to develop. Set aside 5-15 minutes a day to do them. Therapists can offer training in these techniques. Do these things to help manage  stress: Keep a journal. Know your limits. Set healthy boundaries for yourself and others, such as saying no when you think something is too much. Pay attention to how you react to certain situations. You may not be able to control everything, but you can change your reaction. Add humor to your life by watching funny movies or shows. Make time for activities that you enjoy and that relax you. Spend less time using electronics, especially at night before bed. The light from screens can make your brain think it is time to get up rather than go to bed.  Medicines Medicines, such as antidepressants, are often a part of treatment for depression. Talk with your pharmacist or health care provider about all the medicines, supplements, and herbal products that you take, their possible side effects, and what medicines and other products are safe to take together. Make sure to report any side effects you may have to your health care provider. Relationships Your health care provider may suggest family therapy, couples therapy, or individual therapy as part of your treatment. How to recognize changes Everyone responds differently to treatment for depression. As you recover from depression, you may start to: Have more interest in doing activities. Feel more hopeful. Have more energy. Eat a more regular amount of food. Have better mental focus. It is important to recognize if your depression is not getting better or is getting worse. The symptoms you had in the beginning may return, such as: Feeling tired. Eating too much or too little. Sleeping too much or too little. Feeling  restless, agitated, or hopeless. Trouble focusing or making decisions. Having unexplained aches and pains. Feeling irritable, angry, or aggressive. If you or your family members notice these symptoms coming back, let your health care provider know right away. Follow these instructions at home: Activity Try to get some form of  exercise each day, such as walking. Try yoga, mindfulness, or other stress reduction techniques. Participate in group activities if you are able. Lifestyle Get enough sleep. Cut down on or stop using caffeine, tobacco, alcohol, and any other harmful substances. Eat a healthy diet that includes plenty of vegetables, fruits, whole grains, low-fat dairy products, and lean protein. Limit foods that are high in solid fats, added sugar, or salt (sodium). General instructions Take over-the-counter and prescription medicines only as told by your health care provider. Keep all follow-up visits. It is important for your health care provider to check on your mood, behavior, and medicines. Your health care provider may need to make changes to your treatment. Where to find support Talking to others  Friends and family members can be sources of support and guidance. Talk to trusted friends or family members about your condition. Explain your symptoms and let them know that you are working with a health care provider to treat your depression. Tell friends and family how they can help. Finances Find mental health providers that fit with your financial situation. Talk with your health care provider if you are worried about access to food, housing, or medicine. Call your insurance company to learn about your co-pays and prescription plan. Where to find more information You can find support in your area from: Anxiety and Depression Association of America (ADAA): adaa.org Mental Health America: mentalhealthamerica.net The [employer] on Mental Illness: nami.org Contact a health care provider if: You stop taking your antidepressant medicines, and you have any of these symptoms: Nausea. Headache. Light-headedness. Chills and body aches. Not being able to sleep (insomnia). You or your friends and family think your depression is getting worse. Get help right away if: You have thoughts of hurting yourself  or others. Get help right away if you feel like you may hurt yourself or others, or have thoughts about taking your own life. Go to your nearest emergency room or: Call 911. Call the National Suicide Prevention Lifeline at 828-440-4386 or 988. This is open 24 hours a day. Text the Crisis Text Line at 601 476 6779. This information is not intended to replace advice given to you by your health care provider. Make sure you discuss any questions you have with your health care provider. Document Revised: 01/29/2022 Document Reviewed: 01/29/2022 Elsevier Patient Education  2024 [employer].

## 2024-05-06 NOTE — Assessment & Plan Note (Signed)
 Julia Gutierrez has noticed redness on her nose for about three months, which has gradually increased. There is no pain associated with the redness, and it does not appear to be related to sun exposure or skin cancer. The redness may be related to autoimmune issues.

## 2024-05-06 NOTE — Assessment & Plan Note (Signed)
 Blood pressure is quite elevated in the office today. She is taking her medications. She has been monitoring her blood pressure at home and feels that a diuretic may help manage her blood pressure, as it was effective for her mother. It is likely that stress and weight gain are both contributors to the elevation in blood pressures recently.  - Check blood pressure at home - Take clonidine  if blood pressure is more than 130/80 mmHg - Consider adding a diuretic after lab review - continue current medications for now.

## 2024-05-06 NOTE — Assessment & Plan Note (Signed)
 Repeat labs today for monitoring. Changes in mood and weight are concerning for possible changes in control. Will continue levothyroxine at the current dose for now.

## 2024-05-06 NOTE — Assessment & Plan Note (Signed)
 Asmi reports feeling overwhelmed and struggling with depression, exacerbated by recent life stressors including health, financial and vehicle issues. She is doing better now but acknowledges the need for counseling to help manage her mental health. She does not want to take medications at this time, which is reasonable. I do feel that counseling is important to initiate quickly given her high PHQ numbers. No SI/HI. We discussed delays that can present with finding mental health professionals and I offered to place referral to [facility] for social work to offer support while we work to get her in with a Veterinary surgeon. She is interested in this. We can also see if there are resources that may help reduce her financial burden.  - Refer to licensed clinical social worker for counseling - Refer to psychology for counseling.

## 2024-05-06 NOTE — Assessment & Plan Note (Signed)
 Julia Gutierrez is experiencing intense sugar cravings, which may be related to insulin resistance. She is aware of the impact of her diet on her blood sugar levels and is seeking ways to manage her cravings and improve her diet. - Check A1c to assess blood sugar levels

## 2024-05-06 NOTE — Assessment & Plan Note (Signed)
 Julia Gutierrez is concerned about her heart health and feels apprehensive about exercising due to her cardiac condition. She has not been back to cardiology for a while and feels uncomfortable with her current cardiology care. We discussed trying to transfer care and she is open to this. For now, plan to continue her current medications. I will review labs to determine if a diuretic would be contraindicated for blood pressure control.

## 2024-05-07 ENCOUNTER — Telehealth: Payer: Self-pay

## 2024-05-07 ENCOUNTER — Other Ambulatory Visit (HOSPITAL_BASED_OUTPATIENT_CLINIC_OR_DEPARTMENT_OTHER): Payer: Self-pay

## 2024-05-07 LAB — TSH: TSH: 2.77 u[IU]/mL (ref 0.450–4.500)

## 2024-05-07 LAB — COMPREHENSIVE METABOLIC PANEL WITH GFR
ALT: 14 IU/L (ref 0–32)
AST: 19 IU/L (ref 0–40)
Albumin: 4.1 g/dL (ref 3.9–4.9)
Alkaline Phosphatase: 119 IU/L (ref 44–121)
BUN/Creatinine Ratio: 20 (ref 12–28)
BUN: 17 mg/dL (ref 8–27)
Bilirubin Total: 0.5 mg/dL (ref 0.0–1.2)
CO2: 21 mmol/L (ref 20–29)
Calcium: 9.4 mg/dL (ref 8.7–10.3)
Chloride: 104 mmol/L (ref 96–106)
Creatinine, Ser: 0.83 mg/dL (ref 0.57–1.00)
Globulin, Total: 3.2 g/dL (ref 1.5–4.5)
Glucose: 80 mg/dL (ref 70–99)
Potassium: 4.1 mmol/L (ref 3.5–5.2)
Sodium: 139 mmol/L (ref 134–144)
Total Protein: 7.3 g/dL (ref 6.0–8.5)
eGFR: 76 mL/min/1.73 (ref >59)

## 2024-05-07 LAB — CBC WITH DIFFERENTIAL/PLATELET
Basophils Absolute: 0.2 x10E3/uL (ref 0.0–0.2)
Basos: 2 %
EOS (ABSOLUTE): 1 x10E3/uL — ABNORMAL HIGH (ref 0.0–0.4)
Eos: 9 %
Hematocrit: 41.2 % (ref 34.0–46.6)
Hemoglobin: 12.9 g/dL (ref 11.1–15.9)
Immature Grans (Abs): 0 x10E3/uL (ref 0.0–0.1)
Immature Granulocytes: 0 %
Lymphocytes Absolute: 3.1 x10E3/uL (ref 0.7–3.1)
Lymphs: 29 %
MCH: 26.7 pg (ref 26.6–33.0)
MCHC: 31.3 g/dL — ABNORMAL LOW (ref 31.5–35.7)
MCV: 85 fL (ref 79–97)
Monocytes Absolute: 1 x10E3/uL — ABNORMAL HIGH (ref 0.1–0.9)
Monocytes: 9 %
Neutrophils Absolute: 5.5 x10E3/uL (ref 1.4–7.0)
Neutrophils: 51 %
Platelets: 360 x10E3/uL (ref 150–450)
RBC: 4.83 x10E6/uL (ref 3.77–5.28)
RDW: 15.4 % (ref 11.7–15.4)
WBC: 10.9 x10E3/uL — ABNORMAL HIGH (ref 3.4–10.8)

## 2024-05-07 LAB — HEMOGLOBIN A1C
Est. average glucose Bld gHb Est-mCnc: 120 mg/dL
Hgb A1c MFr Bld: 5.8 — AB (ref 4.8–5.6)

## 2024-05-07 LAB — T4, FREE: Free T4: 1 ng/dL (ref 0.82–1.77)

## 2024-05-07 NOTE — Progress Notes (Signed)
 Complex Care Management Note Care Guide Note  05/07/2024 Name: Julia Gutierrez MRN: [id number] DOB: 04-Jan-1956   Complex Care Management Outreach Attempts: An unsuccessful telephone outreach was attempted today to offer the patient information about available complex care management services.  Follow Up Plan:  Additional outreach attempts will be made to offer the patient complex care management information and services.   Encounter Outcome:  No Answer  Leotis Rase [facility], [facility] Guide  Direct Dial: [phone]  Fax [phone]

## 2024-05-10 NOTE — Progress Notes (Unsigned)
 Complex Care Management Note Care Guide Note  05/10/2024 Name: Julia Gutierrez MRN: [id number] DOB: 1956-06-13   Complex Care Management Outreach Attempts: A second unsuccessful outreach was attempted today to offer the patient with information about available complex care management services.  Follow Up Plan:  Additional outreach attempts will be made to offer the patient complex care management information and services.   Encounter Outcome:  No Answer  Leotis Rase [facility], [facility] Guide  Direct Dial: [phone]  Fax [phone]

## 2024-05-11 ENCOUNTER — Ambulatory Visit: Payer: Self-pay | Admitting: Nurse Practitioner

## 2024-05-12 ENCOUNTER — Telehealth: Payer: Self-pay

## 2024-05-12 NOTE — Progress Notes (Signed)
 Complex Care Management Note Care Guide Note  05/12/2024 Name: Julia Gutierrez MRN: [id number] DOB: 11-07-1955   Complex Care Management Outreach Attempts: A third unsuccessful outreach was attempted today to offer the patient with information about available complex care management services.  Follow Up Plan:  No further outreach attempts will be made at this time. We have been unable to contact the patient to offer or enroll patient in complex care management services.  Encounter Outcome:  No Answer  Leotis Rase [facility], [facility] Guide  Direct Dial: [phone]  Fax [phone]

## 2024-05-12 NOTE — Progress Notes (Signed)
 Complex Care Management Note  Care Guide Note 05/12/2024 Name: [name] MRN: [id number] DOB: 09/22/1956  Julia Gutierrez is a 68 y.o. year old female who sees Early, Camie BRAVO, NP for primary care. I reached out to [name] by phone today to offer complex care management services.  [name] was given information about Complex Care Management services today including:   The Complex Care Management services include support from the care team which includes your Nurse Care Manager, Clinical Social Worker, or Pharmacist.  The Complex Care Management team is here to help remove barriers to the health concerns and goals most important to you. Complex Care Management services are voluntary, and the patient may decline or stop services at any time by request to their care team member.   Complex Care Management Consent Status: Patient agreed to services and verbal consent obtained.   Follow up plan:  Telephone appointment with complex care management team member scheduled for:  05/17/24 @ 1 PM.   Encounter Outcome:  Patient Scheduled   Leotis Rase [facility], [facility] Guide  Direct Dial: [phone]  Fax [phone]

## 2024-05-14 ENCOUNTER — Telehealth: Payer: Self-pay

## 2024-05-14 NOTE — Progress Notes (Signed)
   Telephone encounter was:  Unsuccessful.  05/14/2024 Name: Tawnie Ehresman MRN: [id number] DOB: October 14, 1955  Unsuccessful outbound call made today to assist with:  Transportation Needs  and Financial Difficulties related to financial strain  Outreach Attempt:  1st Attempt  A HIPAA compliant voice message was left requesting a return call.  Instructed patient to call back    Jon Colt [facility] Guide, Phone: [phone] Fax: [phone] Website: [email or web address]

## 2024-05-17 ENCOUNTER — Other Ambulatory Visit: Payer: Self-pay | Admitting: Licensed Clinical Social Worker

## 2024-05-17 DIAGNOSIS — E222 Syndrome of inappropriate secretion of antidiuretic hormone: Secondary | ICD-10-CM

## 2024-05-17 NOTE — Addendum Note (Signed)
 Addended by: MARANDA LISTER D on: 05/17/2024 02:36 PM   Modules accepted: Orders

## 2024-05-17 NOTE — Patient Instructions (Signed)
 Visit Information  Thank you for taking time to visit with me today. Please don't hesitate to contact me if I can be of assistance to you before our next scheduled appointment.  Our next appointment is by telephone on 05/31/2024 at 1:00 pm Please call the care guide team at [phone] if you need to cancel or reschedule your appointment.   Following is a copy of your care plan:   Goals Addressed             This Visit's Progress    BSW [facility] Social Work Care Plan       Problems:   Corporate treasurer  and Food Insecurity stress and depression   CSW Clinical Goal(s):   Over the next 2 weeks the Patient will review resources mailed for food pantry and rent and utility assistance.  Interventions:  SW will mail recourses and make a referral to an LCSW  Patient Goals/Self-Care Activities:  Review resources and download th GGFF app.   Plan:   Telephone follow up appointment with care management team member scheduled for:  05/31/2024 at 1:00 pm        Please call the Suicide and Crisis Lifeline: 988 go to [facility] [address], [place] [phone]) call 911 if you are experiencing a Mental Health or Behavioral Health Crisis or need someone to talk to.  Patient verbalizes understanding of instructions and care plan provided today and agrees to view in MyChart. Active MyChart status and patient understanding of how to access instructions and care plan via MyChart confirmed with patient.     Tobias CHARM Maranda HEDWIG, PhD [facility], [facility] Social Worker Direct Dial: [phone]  Fax: [phone]

## 2024-05-17 NOTE — Patient Outreach (Signed)
 Complex Care Management   Visit Note  05/17/2024  Name:  Julia Gutierrez MRN: [id number] DOB: [birth year]  Situation: Referral received for Complex Care Management related to SDOH Barriers:  Food insecurity Depression Financial Resource Strain Stress I obtained verbal consent from Patient.  Visit completed with patient  on the phone  Background:   Past Medical History:  Diagnosis Date   Acute cystitis without hematuria 08/28/2021   Allergy 2002   Atherosclerosis of aorta 10/29/2022   Attention deficit hyperactivity disorder (ADHD), predominantly inattentive type 07/03/2023   Cerebrovascular disease    Cervical spondylosis 05/04/2021   Cervicogenic headache 04/13/2021   Chronic fatigue 04/13/2021   Chronic vertigo 04/13/2021   Colicky RUQ abdominal pain 02/24/2022   Dehydration 05/20/2023   Elevated TSH 11/13/2023   Enlarged glands 06/18/2021   Generalized anxiety disorder    Heart murmur    History of COVID-19    History of non-ST elevation myocardial infarction (NSTEMI) 10/27/2023   Hypocalcemia 11/13/2023   Hypothyroidism    IBS (irritable bowel syndrome)    Increased thirst 04/13/2021   Lumbar pain 12/23/2022   Migraine with status migrainosus, not intractable 05/04/2021   Mixed hyperlipidemia 11/13/2023   Mouth dryness 04/13/2021   Multiple lacunar infarcts    Chronic lacunar infarcts within the bilateral cerebral hemispheric white matter and dorsolateral left thalamus   Muscle spasms of neck 05/20/2023   Palpitations 04/13/2021   Periumbilical abdominal pain 03/07/2023   Primary hypertension 09/17/2022   Primary insomnia 07/03/2023   Pulmonary nodules/lesions, multiple 11/13/2023   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 SW will mail out resources for the food pantry and rental /utility assistance. Patient is also dealing with depression and stress and SW will make a referral to an LCSW.  SDOH Interventions    Flowsheet Row Patient Outreach Telephone from 05/17/2024 in [place] POPULATION HEALTH DEPARTMENT Office Visit from 05/06/2024 in [place] [facility] Visit from 10/29/2022 in [facility] Visit from 05/04/2021 in [facility] Primary Care & Sports Medicine at [facility]  SDOH Interventions      Food Insecurity Interventions Community Resources Provided  [SW will mail the food pantry list and educated about GGFF app] -- -- --  Housing Interventions Community Resources Provided  [SW will mail resources] -- -- --  Transportation Interventions Intervention Not Indicated -- -- --  Utilities Interventions Intervention Not Indicated -- -- --  Depression Interventions/Treatment  -- Counseling, Medication Counseling, Currently on Treatment Medication  Financial Strain Interventions Intervention Not Indicated -- -- --      Recommendation:   none  Follow Up Plan:   Telephone follow up appointment date/time:  05/31/2024 at 1:00 pm  Tobias CHARM Maranda HEDWIG, PhD [facility], [facility] Social Worker Direct Dial: [phone]  Fax: [phone]

## 2024-05-18 ENCOUNTER — Telehealth: Payer: Self-pay

## 2024-05-18 NOTE — Progress Notes (Signed)
   Telephone encounter was:  Unsuccessful.  05/18/2024 Name: Kellianne Ek MRN: [id number] DOB: 11/09/1955  Unsuccessful outbound call made today to assist with:  Financial Difficulties related to financial strain  Outreach Attempt:  2nd Attempt  No answer and unable to leave a message    Jon Colt [facility]  [facility] Guide, Phone: [phone] Fax: [phone] Website: [email or web address]

## 2024-05-20 ENCOUNTER — Telehealth: Payer: Self-pay

## 2024-05-20 NOTE — Progress Notes (Signed)
   Telephone encounter was:  Unsuccessful.  05/20/2024 Name: Rukaya Kleinschmidt MRN: [id number] DOB: 09-13-1956  Unsuccessful outbound call made today to assist with:  Transportation Needs  and Financial Difficulties related to financial strain  Outreach Attempt:  3rd Attempt.  Referral closed unable to contact patient.  No answer and unable to leave a message I have mailed resources to the Pt   Jon Colt [facility]  [facility] Guide, Phone: [phone] Fax: [phone] Website: [email or web address]

## 2024-05-28 ENCOUNTER — Ambulatory Visit: Payer: Medicare Other

## 2024-05-28 ENCOUNTER — Other Ambulatory Visit: Payer: Medicare Other

## 2024-05-31 ENCOUNTER — Other Ambulatory Visit: Payer: Self-pay | Admitting: Licensed Clinical Social Worker

## 2024-05-31 NOTE — Patient Instructions (Signed)

## 2024-05-31 NOTE — Patient Outreach (Signed)
 Complex Care Management   Visit Note  05/31/2024  Name:  Julia Gutierrez MRN: [id number] DOB: 25-Sep-1956  Situation: Referral received for Complex Care Management related to SDOH Barriers:  Food insecurity Financial Resource Strain I obtained verbal consent from Patient.  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   Cerebrovascular disease    Cervical spondylosis 05/04/2021   Cervicogenic headache 04/13/2021   Chronic fatigue 04/13/2021   Chronic vertigo 04/13/2021   Colicky RUQ abdominal pain 02/24/2022   Dehydration 05/20/2023   Elevated TSH 11/13/2023   Enlarged glands 06/18/2021   Generalized anxiety disorder    Heart murmur    History of COVID-19    History of non-ST elevation myocardial infarction (NSTEMI) 10/27/2023   Hypocalcemia 11/13/2023   Hypothyroidism    IBS (irritable bowel syndrome)    Increased thirst 04/13/2021   Lumbar pain 12/23/2022   Migraine with status migrainosus, not intractable 05/04/2021   Mixed hyperlipidemia 11/13/2023   Mouth dryness 04/13/2021   Multiple lacunar infarcts    Chronic lacunar infarcts within the bilateral cerebral hemispheric white matter and dorsolateral left thalamus   Muscle spasms of neck 05/20/2023   Palpitations 04/13/2021   Periumbilical abdominal pain 03/07/2023   Primary hypertension 09/17/2022   Primary insomnia 07/03/2023   Pulmonary nodules/lesions, multiple 11/13/2023   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   Rib pain on right side 12/04/2021   Situational depression    seasonal component   Sjogren syndrome with dental involvement 04/13/2021   Skin lesion 10/29/2022   Tooth infection 05/04/2021   Vertigo 04/13/2021   Vision changes 04/13/2021   Vitamin B12  deficiency 02/24/2022    Assessment: Patient stated that she received the resources in the mail. No other SDOH needs at this time   SDOH Interventions    Flowsheet Row Patient Outreach Telephone from 05/17/2024 in [place] [facility] DEPARTMENT Office Visit from 05/06/2024 in [place] [facility] Office Visit from 10/29/2022 in [facility] Visit from 05/04/2021 in [facility] Primary Care & Sports Medicine at [facility]  SDOH Interventions      Food Insecurity Interventions Community Resources Provided  [SW will mail the food pantry list and educated about GGFF app] -- -- --  Housing Interventions Community Resources Provided  [SW will mail resources] -- -- --  Transportation Interventions Intervention Not Indicated -- -- --  Utilities Interventions Intervention Not Indicated -- -- --  Depression Interventions/Treatment  -- Counseling, Medication Counseling, Currently on Treatment Medication  Financial Strain Interventions Intervention Not Indicated -- -- --      Recommendation:   none  Follow Up Plan:   Closing From:  Complex Care Management  Tobias CHARM Maranda HEDWIG, PhD [facility], [facility] Social Worker Direct Dial: [phone]  Fax: [phone]

## 2024-06-02 ENCOUNTER — Encounter: Payer: Self-pay | Admitting: Nurse Practitioner

## 2024-06-02 ENCOUNTER — Telehealth: Admitting: Physician Assistant

## 2024-06-02 DIAGNOSIS — R3989 Other symptoms and signs involving the genitourinary system: Secondary | ICD-10-CM

## 2024-06-02 NOTE — Progress Notes (Signed)
  Because of increased risk for complicated UTI and antibiotic resistance in those 65 and older, the standard of care is for a physical exam and that a urine culture be obtained prior to onset of treatments. As such, I feel your condition warrants further evaluation and I recommend that you be seen in a face-to-face visit.   NOTE: There will be NO CHARGE for this E-Visit   If you are having a true medical emergency, please call 911.     For an urgent face to face visit, [place] has multiple urgent care centers for your convenience.  Click the link below for the full list of locations and hours, walk-in wait times, appointment scheduling options and driving directions:  Urgent Care - [place], [place], [place], [place], [place], [place]  [place]     Your MyChart E-visit questionnaire answers were reviewed by a board certified advanced clinical practitioner to complete your personal care plan based on your specific symptoms.    Thank you for using e-Visits.

## 2024-06-03 NOTE — Telephone Encounter (Signed)
 Left message needs appt-lm

## 2024-06-08 ENCOUNTER — Other Ambulatory Visit (HOSPITAL_BASED_OUTPATIENT_CLINIC_OR_DEPARTMENT_OTHER): Payer: Self-pay

## 2024-06-09 ENCOUNTER — Other Ambulatory Visit (HOSPITAL_BASED_OUTPATIENT_CLINIC_OR_DEPARTMENT_OTHER): Payer: Self-pay

## 2024-06-22 ENCOUNTER — Ambulatory Visit: Attending: Cardiovascular Disease | Admitting: Cardiovascular Disease

## 2024-06-22 ENCOUNTER — Other Ambulatory Visit (HOSPITAL_BASED_OUTPATIENT_CLINIC_OR_DEPARTMENT_OTHER): Payer: Self-pay

## 2024-06-22 ENCOUNTER — Encounter: Payer: Self-pay | Admitting: Cardiovascular Disease

## 2024-06-22 VITALS — BP 156/92 | HR 66 | Ht 62.0 in | Wt 184.0 lb

## 2024-06-22 DIAGNOSIS — I2510 Atherosclerotic heart disease of native coronary artery without angina pectoris: Secondary | ICD-10-CM | POA: Insufficient documentation

## 2024-06-22 DIAGNOSIS — I10 Essential (primary) hypertension: Secondary | ICD-10-CM | POA: Diagnosis not present

## 2024-06-22 DIAGNOSIS — E785 Hyperlipidemia, unspecified: Secondary | ICD-10-CM | POA: Diagnosis present

## 2024-06-22 DIAGNOSIS — E782 Mixed hyperlipidemia: Secondary | ICD-10-CM | POA: Insufficient documentation

## 2024-06-22 DIAGNOSIS — I350 Nonrheumatic aortic (valve) stenosis: Secondary | ICD-10-CM | POA: Diagnosis present

## 2024-06-22 DIAGNOSIS — I25118 Atherosclerotic heart disease of native coronary artery with other forms of angina pectoris: Secondary | ICD-10-CM | POA: Diagnosis not present

## 2024-06-22 DIAGNOSIS — R0989 Other specified symptoms and signs involving the circulatory and respiratory systems: Secondary | ICD-10-CM | POA: Diagnosis present

## 2024-06-22 MED ORDER — LOSARTAN POTASSIUM 50 MG PO TABS
50.0000 mg | ORAL_TABLET | Freq: Every day | ORAL | 3 refills | Status: AC
  Filled 2024-06-22 – 2024-07-12 (×2): qty 90, 90d supply, fill #0
  Filled 2024-10-12: qty 90, 90d supply, fill #1

## 2024-06-22 MED ORDER — CARVEDILOL 6.25 MG PO TABS
6.2500 mg | ORAL_TABLET | Freq: Two times a day (BID) | ORAL | 3 refills | Status: AC
  Filled 2024-06-22 – 2024-07-12 (×2): qty 180, 90d supply, fill #0
  Filled 2024-10-12: qty 180, 90d supply, fill #1

## 2024-06-22 NOTE — Assessment & Plan Note (Signed)
 History of hyperlipidemia and intolerant to statin therapy with lipid profile performed 12/30/2023 revealing total cholesterol 181, LDL 112 and HDL of 49.  She is not at goal for secondary prevention (LDL less than 55).  I am referring her to Pharm.D. to discuss PCSK9.

## 2024-06-22 NOTE — Patient Instructions (Signed)
 Medication Instructions:  Your physician has recommended you make the following change in your medication:   -Increase losartan  (cozaar ) to 50mg  once daily.  -Increase carvedilol  (coreg ) to 6.25mg  twice daily.  *If you need a refill on your cardiac medications before your next appointment, please call your pharmacy*  Testing/Procedures: Your physician has requested that you have a carotid duplex. This test is an ultrasound of the carotid arteries in your neck. It looks at blood flow through these arteries that supply the brain with blood. Allow one hour for this exam. There are no restrictions or special instructions. This will take place at [address], 4th floor  Please note: We ask at that you not bring children with you during ultrasound (echo/ vascular) testing. Due to room size and safety concerns, children are not allowed in the ultrasound rooms during exams. Our front office staff cannot provide observation of children in our lobby area while testing is being conducted. An adult accompanying a patient to their appointment will only be allowed in the ultrasound room at the discretion of the ultrasound technician under special circumstances. We apologize for any inconvenience.   Your physician has requested that you have an echocardiogram. Echocardiography is a painless test that uses sound waves to create images of your heart. It provides your doctor with information about the size and shape of your heart and how well your heart's chambers and valves are working. This procedure takes approximately one hour. There are no restrictions for this procedure. Please do NOT wear cologne, perfume, aftershave, or lotions (deodorant is allowed). Please arrive 15 minutes prior to your appointment time.  Please note: We ask at that you not bring children with you during ultrasound (echo/ vascular) testing. Due to room size and safety concerns, children are not allowed in the ultrasound rooms during  exams. Our front office staff cannot provide observation of children in our lobby area while testing is being conducted. An adult accompanying a patient to their appointment will only be allowed in the ultrasound room at the discretion of the ultrasound technician under special circumstances. We apologize for any inconvenience. **To do in January**   Follow-Up: At [facility], you and your health needs are our priority.  As part of our continuing mission to provide you with exceptional heart care, our providers are all part of one team.  This team includes your primary Cardiologist (physician) and Advanced Practice Providers or APPs (Physician Assistants and Nurse Practitioners) who all work together to provide you with the care you need, when you need it.  Your next appointment:   3 month(s)  Provider:   Damien Braver, NP         Then, Dorn Lesches, MD will plan to see you again in 12 month(s).    We recommend signing up for the patient portal called MyChart.  Sign up information is provided on this After Visit Summary.  MyChart is used to connect with patients for Virtual Visits (Telemedicine).  Patients are able to view lab/test results, encounter notes, upcoming appointments, etc.  Non-urgent messages can be sent to your provider as well.   To learn more about what you can do with MyChart, go to [email or web address].   Other Instructions Dr. Lesches has requested that you schedule an appointment with one of our clinical pharmacists for a blood pressure check appointment within the next 4-5 weeks.   We will also use this appointment to further discuss cholesterol therapy.  If you monitor your blood  pressure (BP) at home, please bring your BP cuff and your BP readings with you to this appointment  HOW TO TAKE YOUR BLOOD PRESSURE: Rest 5 minutes before taking your blood pressure. Don't smoke or drink caffeinated beverages for at least 30 minutes before. Take your blood  pressure before (not after) you eat. Sit comfortably with your back supported and both feet on the floor (don't cross your legs). Elevate your arm to heart level on a table or a desk. Use the proper sized cuff. It should fit smoothly and snugly around your bare upper arm. There should be enough room to slip a fingertip under the cuff. The bottom edge of the cuff should be 1 inch above the crease of the elbow. Ideally, take 3 measurements at one sitting and record the average.

## 2024-06-22 NOTE — Assessment & Plan Note (Signed)
 2D echo performed 10/29/2023 revealed normal LV systolic function with an aortic valve area of 1 cm, peak gradient of 19 mmHg.  Will continue to follow this noninvasively on annual basis.

## 2024-06-22 NOTE — Progress Notes (Signed)
 06/22/2024 [name]   11-24-1955  [id number]  Primary Physician Early, Camie BRAVO, NP Primary Cardiologist: Dorn JINNY Lesches MD GENI CODY LYNITA ILAH  HPI:  Julia Gutierrez is a 68 y.o. thin-appearing divorced Caucasian female mother of 3 children, grandmother of 5 grandchildren who works as a Engineering geologist.  I first met her during her hospitalization 10/28/2023 when she came in with chest pain and elevated enzymes consistent with non-STEMI.  Her cardiac risk factors notable for treated hypertension, untreated hyperlipidemia.  She has a strong family history of heart disease with both parents who had ischemic heart disease and a brother who died of myocardial infarction.  During her hospitalization she underwent cardiac catheterization by Dr. Verlin 10/28/2023 revealing a 60% distal dominant RCA stenosis, 70% PLA branch stenosis and 60% mid AV groove circumflex stenosis which she did not think were physiologically significant or required intervention.  In addition, she did have a 2D echo that revealed normal LV systolic function with moderate aortic stenosis and a valve area of 1 cm.  Since I saw her 8 months ago she has remained stable.  She is fairly active and is asymptomatic.   Current Meds  Medication Sig   acetaminophen  (TYLENOL ) 500 MG tablet Take 1,000 mg by mouth 2 (two) times daily as needed for moderate pain (pain score 4-6), fever or headache.   amLODipine  (NORVASC ) 5 MG tablet Take 1 tablet (5 mg total) by mouth at bedtime.   carvedilol  (COREG ) 3.125 MG tablet Take 1 tablet (3.125 mg total) by mouth 2 (two) times daily with a meal.   cloNIDine  (CATAPRES ) 0.1 MG tablet Take 1 tablet (0.1 mg total) by mouth 3 (three) times daily as needed.   clopidogrel  (PLAVIX ) 75 MG tablet Take 1 tablet (75 mg total) by mouth daily.   isosorbide  mononitrate (IMDUR ) 60 MG 24 hr tablet Take 1 tablet (60 mg total) by mouth daily.   losartan  (COZAAR ) 25 MG tablet Take 1 tablet  (25 mg total) by mouth daily.   Vitamin D , Ergocalciferol , (DRISDOL ) 1.25 MG (50000 UNIT) CAPS capsule Take 1 capsule (50,000 Units total) by mouth every 7 (seven) days.     Allergies  Allergen Reactions   Promethazine Other (See Comments)    Muscle spasms     Social History   Socioeconomic History   Marital status: Divorced    Spouse name: Not on file   Number of children: 3   Years of education: 34   Highest education level: Master's degree (e.g., MA, MS, MEng, MEd, MSW, MBA)  Occupational History   Occupation: Production designer, theatre/television/film    Comment: storage facility  Tobacco Use   Smoking status: Never    Passive exposure: Never   Smokeless tobacco: Never  Vaping Use   Vaping status: Never Used  Substance and Sexual Activity   Alcohol use: Not Currently    Comment: Glass of wine once a year   Drug use: Never   Sexual activity: Not Currently    Birth control/protection: Abstinence  Other Topics Concern   Not on file  Social History Narrative   Right handed   Caffeine 3 cups a day tea and energy drinks   Lives alone one level home   An apartment with her dog   Social Drivers of Health   Financial Resource Strain: High Risk (05/17/2024)   Overall Financial Resource Strain (CARDIA)    Difficulty of Paying Living Expenses: Very hard  Food Insecurity: Food Insecurity Present (  05/17/2024)   Hunger Vital Sign    Worried About Running Out of Food in the Last Year: Often true    Ran Out of Food in the Last Year: Sometimes true  Transportation Needs: No Transportation Needs (05/17/2024)   PRAPARE - Administrator, Civil Service (Medical): No    Lack of Transportation (Non-Medical): No  Recent Concern: Transportation Needs - Unmet Transportation Needs (05/05/2024)   PRAPARE - Transportation    Lack of Transportation (Medical): No    Lack of Transportation (Non-Medical): Yes  Physical Activity: Sufficiently Active (05/05/2024)   Exercise Vital Sign    Days of Exercise per Week: 6  days    Minutes of Exercise per Session: 90 min  Stress: Stress Concern Present (05/05/2024)   [employer] of Occupational Health - Occupational Stress Questionnaire    Feeling of Stress: Very much  Social Connections: Socially Isolated (05/05/2024)   Social Connection and Isolation Panel    Frequency of Communication with Friends and Family: More than three times a week    Frequency of Social Gatherings with Friends and Family: Never    Attends Religious Services: Never    Database administrator or Organizations: No    Attends Engineer, structural: Not on file    Marital Status: Divorced  Intimate Partner Violence: Not At Risk (05/17/2024)   Humiliation, Afraid, Rape, and Kick questionnaire    Fear of Current or Ex-Partner: No    Emotionally Abused: No    Physically Abused: No    Sexually Abused: No     Review of Systems: General: negative for chills, fever, night sweats or weight changes.  Cardiovascular: negative for chest pain, dyspnea on exertion, edema, orthopnea, palpitations, paroxysmal nocturnal dyspnea or shortness of breath Dermatological: negative for rash Respiratory: negative for cough or wheezing Urologic: negative for hematuria Abdominal: negative for nausea, vomiting, diarrhea, bright red blood per rectum, melena, or hematemesis Neurologic: negative for visual changes, syncope, or dizziness All other systems reviewed and are otherwise negative except as noted above.    Blood pressure (!) 156/92, pulse 66, height 5' 2 (1.575 m), weight 184 lb (83.5 kg), SpO2 97%.  General appearance: alert and no distress Neck: no adenopathy, no JVD, supple, symmetrical, trachea midline, thyroid  not enlarged, symmetric, no tenderness/mass/nodules, and   Lungs: clear to auscultation bilaterally Heart: Soft outflow tract murmur consistent with aortic stenosis Extremities: extremities normal, atraumatic, no cyanosis or edema Pulses: 2+ and symmetric Skin: Skin color,  texture, turgor normal. No rashes or lesions Neurologic: Grossly normal  EKG EKG Interpretation Date/Time:  Tuesday June 22 2024 09:05:34 EDT Ventricular Rate:  66 PR Interval:  170 QRS Duration:  82 QT Interval:  394 QTC Calculation: 413 R Axis:   11  Text Interpretation: Normal sinus rhythm Cannot rule out Anterior infarct , age undetermined When compared with ECG of 06-Nov-2023 08:33, No significant change was found Confirmed by Court Carrier [phone]) on 06/22/2024 9:22:57 AM    ASSESSMENT AND PLAN:   Primary hypertension History of essential hypertension blood pressure measured today at 156/92.  This is similar to her findings at home when she measured her blood pressure.  She is on amlodipine , carvedilol , and low-dose losartan .  She takes clonidine  0.1 as needed.  I am going to increase her losartan  from 25 to 50 mg and her carvedilol  from 3.125 to 6.25 mg p.o. twice daily.  I have asked her to keep a blood pressure log for 2 weeks and she  will see Pharm.D. after that to review and make appropriate changes.  Mixed hyperlipidemia History of hyperlipidemia and intolerant to statin therapy with lipid profile performed 12/30/2023 revealing total cholesterol 181, LDL 112 and HDL of 49.  She is not at goal for secondary prevention (LDL less than 55).  I am referring her to Pharm.D. to discuss PCSK9.  Coronary artery disease Cardiac catheterization performed by Dr. Verlin 10/28/2023 revealed a 60% distal RCA stenosis, 70% PLA branch, and 60% mid AV groove circumflex stenosis with normal LV function.  He did not think that lesions were trying to have to require intervention.  Her type II non-STEMI was probably from demand ischemia from hypertension.  She has had no recurrent symptoms.  Moderate aortic stenosis 2D echo performed 10/29/2023 revealed normal LV systolic function with an aortic valve area of 1 cm, peak gradient of 19 mmHg.  Will continue to follow this noninvasively on  annual basis.     Dorn DOROTHA Lesches MD FACP,FACC,FAHA, [facility] 06/22/2024 9:41 AM

## 2024-06-22 NOTE — Assessment & Plan Note (Signed)
 Cardiac catheterization performed by Dr. Verlin 10/28/2023 revealed a 60% distal RCA stenosis, 70% PLA branch, and 60% mid AV groove circumflex stenosis with normal LV function.  He did not think that lesions were trying to have to require intervention.  Her type II non-STEMI was probably from demand ischemia from hypertension.  She has had no recurrent symptoms.

## 2024-06-22 NOTE — Assessment & Plan Note (Signed)
 History of essential hypertension blood pressure measured today at 156/92.  This is similar to her findings at home when she measured her blood pressure.  She is on amlodipine , carvedilol , and low-dose losartan .  She takes clonidine  0.1 as needed.  I am going to increase her losartan  from 25 to 50 mg and her carvedilol  from 3.125 to 6.25 mg p.o. twice daily.  I have asked her to keep a blood pressure log for 2 weeks and she will see Pharm.D. after that to review and make appropriate changes.

## 2024-06-23 ENCOUNTER — Ambulatory Visit (INDEPENDENT_AMBULATORY_CARE_PROVIDER_SITE_OTHER): Admitting: Nurse Practitioner

## 2024-06-23 ENCOUNTER — Encounter: Payer: Self-pay | Admitting: Nurse Practitioner

## 2024-06-23 VITALS — BP 144/92 | HR 71 | Wt 185.0 lb

## 2024-06-23 DIAGNOSIS — I10 Essential (primary) hypertension: Secondary | ICD-10-CM | POA: Diagnosis not present

## 2024-06-23 DIAGNOSIS — F329 Major depressive disorder, single episode, unspecified: Secondary | ICD-10-CM | POA: Diagnosis not present

## 2024-06-23 DIAGNOSIS — F411 Generalized anxiety disorder: Secondary | ICD-10-CM

## 2024-06-23 DIAGNOSIS — I252 Old myocardial infarction: Secondary | ICD-10-CM

## 2024-06-23 NOTE — Patient Instructions (Addendum)
 Please call [facility] to get scheduled with a counselor. I feel this would be helpful with managing your current concerns.  Consider looking around to see if there are other locations for employment that may continue to work with your schedule and pay a little better without fear of losing your job.   Please consider speaking with [name] again to see if she would be willing to move in together so you can help her and she can help you.

## 2024-06-23 NOTE — Assessment & Plan Note (Signed)
 Blood pressure is elevated today, however, she has not taken her medication this morning. Recommend taking medication once she returns home. Changes were made at her cardiology visit yesterday to her medication.

## 2024-06-23 NOTE — Progress Notes (Signed)
 Catheline Doing, DNP, AGNP-c [facility]  [address] [place], [place] [address] [phone]  ESTABLISHED PATIENT- Chronic Health and/or Follow-Up Visit on 06/23/2024  Blood pressure (!) 144/92, pulse 71, weight 185 lb (83.9 kg).   HPI: History of Present Illness Julia Gutierrez is a 68 year old female with hypertension and coronary artery disease who presents with medication management and social stressors.  She has recently experienced changes in her medication regimen, including doubling the doses of Carbatrol and Losartan . She did not take Clonidine  last night due to uncertainty about its use with the increased doses. Clonidine  makes her feel tired, and she uses it as needed when her blood pressure feels high. She did not take her medications this morning and notes her blood pressure remains elevated.  She experiences significant fatigue, which she attributes to her medications, particularly Carvedilol , which she takes at 6.25 mg twice daily. She has difficulty sleeping, increased nightmares, and a generally poor mood, describing it as 'horrible'. She describes her mood as 'horrible' and has received referrals for counseling but has had unsatisfactory experiences with social workers.  She is under financial stress due to a significant rent increase and is considering her housing options. She works seven days a week but is tired of it. She has a master's degree and is contemplating taking classes to become a Veterinary surgeon. She feels unsupported by her family, particularly her daughter [name], who is also struggling. She feels a strong need to support her daughter and grandson.  She has a history of a heart attack and is currently on Repatha for cholesterol management, as she could not tolerate other statins. Her cholesterol was 117, and the target is 55. She has stopped taking aspirin  due to excessive bleeding and is only on Plavix  now. She mentions a heart murmur and is  scheduled for a carotid Doppler due to a 'squeaky' sound heard during examination.  All ROS negative with exception of what is listed above.   PHYSICAL EXAM Physical Exam Vitals and nursing note reviewed.  Constitutional:      General: She is not in acute distress.    Appearance: Normal appearance. She is not ill-appearing.  HENT:     Head: Normocephalic.  Eyes:     Conjunctiva/sclera: Conjunctivae normal.     Pupils: Pupils are equal, round, and reactive to light.  Neck:     Vascular: Carotid bruit present.  Cardiovascular:     Rate and Rhythm: Normal rate and regular rhythm.     Heart sounds: Murmur heard.  Pulmonary:     Effort: Pulmonary effort is normal.     Breath sounds: Normal breath sounds.  Musculoskeletal:     Cervical back: Neck supple.     Right lower leg: No edema.     Left lower leg: No edema.  Skin:    General: Skin is warm and dry.     Capillary Refill: Capillary refill takes less than 2 seconds.  Neurological:     Mental Status: She is alert and oriented to person, place, and time.  Psychiatric:        Attention and Perception: Attention normal.        Mood and Affect: Mood is depressed.        Speech: Speech normal.        Behavior: Behavior normal. Behavior is cooperative.        Thought Content: Thought content normal.     PLAN Problem List Items Addressed This Visit  Primary hypertension   Blood pressure is elevated today, however, she has not taken her medication this morning. Recommend taking medication once she returns home. Changes were made at her cardiology visit yesterday to her medication.       History of non-ST elevation myocardial infarction (NSTEMI)   Currently working with cardiology on starting Repatha for lipid management. She has had to stop ASA therapy due to increased bleeding, noting waking in the morning with blood in her mouth and significant bruising on her arms. She is still taking her plavix . We will continue to monitor  closely.       Depression - Primary   Depression exacerbated by significant psychosocial stressors including financial and housing instability. Previous referral to social work was unhelpful. Discussed the importance of finding a supportive therapist. She has a referral in place for [facility] which will be helpful for counseling services. We discussed housing changes that may be helpful as well as employment changes. At this time she does not feel that medication is warranted. She is working on further educating herself to help herself and others, which I support and encourage. We will continue to monitor closely and if she notices and worsening symptoms I recommend reaching out immediately.   - Encourage contact with [facility] for counseling - Discuss potential living arrangements with family for support       Generalized anxiety disorder   See depression           Return in about 4 months (around 10/23/2024) for annual exam when due.  [name], DNP, AGNP-c Time: 61 minutes, >50% spent counseling, care coordination, chart review, and documentation.  SABRAtime

## 2024-06-23 NOTE — Assessment & Plan Note (Signed)
 Currently working with cardiology on starting Repatha for lipid management. She has had to stop ASA therapy due to increased bleeding, noting waking in the morning with blood in her mouth and significant bruising on her arms. She is still taking her plavix . We will continue to monitor closely.

## 2024-06-23 NOTE — Assessment & Plan Note (Signed)
 See depression

## 2024-06-23 NOTE — Assessment & Plan Note (Signed)
 Depression exacerbated by significant psychosocial stressors including financial and housing instability. Previous referral to social work was unhelpful. Discussed the importance of finding a supportive therapist. She has a referral in place for [facility] which will be helpful for counseling services. We discussed housing changes that may be helpful as well as employment changes. At this time she does not feel that medication is warranted. She is working on further educating herself to help herself and others, which I support and encourage. We will continue to monitor closely and if she notices and worsening symptoms I recommend reaching out immediately.   - Encourage contact with [facility] for counseling - Discuss potential living arrangements with family for support

## 2024-07-02 ENCOUNTER — Other Ambulatory Visit (HOSPITAL_BASED_OUTPATIENT_CLINIC_OR_DEPARTMENT_OTHER): Payer: Self-pay

## 2024-07-08 ENCOUNTER — Ambulatory Visit: Payer: Self-pay | Admitting: Cardiovascular Disease

## 2024-07-08 ENCOUNTER — Ambulatory Visit (HOSPITAL_COMMUNITY)
Admission: RE | Admit: 2024-07-08 | Discharge: 2024-07-08 | Disposition: A | Discharge status: Home / Self Care | Source: Ambulatory Visit | Attending: Cardiovascular Disease | Admitting: Cardiovascular Disease

## 2024-07-08 DIAGNOSIS — E782 Mixed hyperlipidemia: Secondary | ICD-10-CM

## 2024-07-08 DIAGNOSIS — R0989 Other specified symptoms and signs involving the circulatory and respiratory systems: Secondary | ICD-10-CM | POA: Insufficient documentation

## 2024-07-08 DIAGNOSIS — I350 Nonrheumatic aortic (valve) stenosis: Secondary | ICD-10-CM

## 2024-07-08 DIAGNOSIS — I10 Essential (primary) hypertension: Secondary | ICD-10-CM

## 2024-07-08 DIAGNOSIS — I25118 Atherosclerotic heart disease of native coronary artery with other forms of angina pectoris: Secondary | ICD-10-CM

## 2024-07-08 DIAGNOSIS — E785 Hyperlipidemia, unspecified: Secondary | ICD-10-CM

## 2024-07-12 ENCOUNTER — Other Ambulatory Visit (HOSPITAL_BASED_OUTPATIENT_CLINIC_OR_DEPARTMENT_OTHER): Payer: Self-pay

## 2024-07-12 ENCOUNTER — Other Ambulatory Visit: Payer: Self-pay

## 2024-07-21 ENCOUNTER — Encounter: Payer: Self-pay | Admitting: Pharmacist Clinician (PhC)/ Clinical Pharmacy Specialist

## 2024-07-21 ENCOUNTER — Other Ambulatory Visit (HOSPITAL_BASED_OUTPATIENT_CLINIC_OR_DEPARTMENT_OTHER): Payer: Self-pay

## 2024-07-21 ENCOUNTER — Ambulatory Visit: Attending: Cardiovascular Disease | Admitting: Pharmacist Clinician (PhC)/ Clinical Pharmacy Specialist

## 2024-07-21 VITALS — BP 132/84 | HR 70

## 2024-07-21 DIAGNOSIS — I10 Essential (primary) hypertension: Secondary | ICD-10-CM | POA: Diagnosis present

## 2024-07-21 MED ORDER — CHLORTHALIDONE 25 MG PO TABS
25.0000 mg | ORAL_TABLET | Freq: Every day | ORAL | 3 refills | Status: AC
  Filled 2024-07-21: qty 90, 90d supply, fill #0
  Filled 2024-10-14: qty 90, 90d supply, fill #1

## 2024-07-21 MED ORDER — ROSUVASTATIN CALCIUM 5 MG PO TABS
ORAL_TABLET | ORAL | 3 refills | Status: AC
  Filled 2024-07-21: qty 30, 100d supply, fill #0

## 2024-07-21 NOTE — Patient Instructions (Signed)
 Follow up appointment: Thursday December 18 at 8:45 am  Go to the lab in 10 days to check kidney function  Take your BP meds as follows:   Stop losartan .   Start chlorthalidone 25 mg once daily in the mornings  Continue amlodipine  and carvedilol   Continue clonidine  as needed for systolic pressure > 150  For cholesterol:  Wait about 10 days then start rosuvastatin 5 mg twice weekly (Monday and Friday)  Check your blood pressure at home 3-4 days per week and keep record of the readings.  Your blood pressure goal is < 130/80  To check your pressure at home you will need to:  1. Sit up in a chair, with feet flat on the floor and back supported. Do not cross your ankles or legs. 2. Rest your left arm so that the cuff is about heart level. If the cuff goes on your upper arm,  then just relax the arm on the table, arm of the chair or your lap. If you have a wrist cuff, we  suggest relaxing your wrist against your chest (think of it as Pledging the Flag with the  wrong arm).  3. Place the cuff snugly around your arm, about 1 inch above the crook of your elbow. The  cords should be inside the groove of your elbow.  4. Sit quietly, with the cuff in place, for about 5 minutes. After that 5 minutes press the power  button to start a reading. 5. Do not talk or move while the reading is taking place.  6. Record your readings on a sheet of paper. Although most cuffs have a memory, it is often  easier to see a pattern developing when the numbers are all in front of you.  7. You can repeat the reading after 1-3 minutes if it is recommended  Make sure your bladder is empty and you have not had caffeine or tobacco within the last 30 min  Always bring your blood pressure log with you to your appointments. If you have not brought your monitor in to be double checked for accuracy, please bring it to your next appointment.  You can find a list of quality blood pressure cuffs at  [employer]  Important lifestyle changes to control high blood pressure  Intervention  Effect on the BP  Lose extra pounds and watch your waistline Weight loss is one of the most effective lifestyle changes for controlling blood pressure. If you're overweight or obese, losing even a small amount of weight can help reduce blood pressure. Blood pressure might go down by about 1 millimeter of mercury (mm Hg) with each kilogram (about 2.2 pounds) of weight lost.  Exercise regularly As a general goal, aim for at least 30 minutes of moderate physical activity every day. Regular physical activity can lower high blood pressure by about 5 to 8 mm Hg.  Eat a healthy diet Eating a diet rich in whole grains, fruits, vegetables, and low-fat dairy products and low in saturated fat and cholesterol. A healthy diet can lower high blood pressure by up to 11 mm Hg.  Reduce salt (sodium) in your diet Even a small reduction of sodium in the diet can improve heart health and reduce high blood pressure by about 5 to 6 mm Hg.  Limit alcohol One drink equals 12 ounces of beer, 5 ounces of wine, or 1.5 ounces of 80-proof liquor.  Limiting alcohol to less than one drink a day for women or two drinks a  day for men can help lower blood pressure by about 4 mm Hg.   If you have any questions or concerns please use My Chart to send questions or call the office at [phone]  .lipoda

## 2024-07-21 NOTE — Assessment & Plan Note (Signed)
 Assessment: BP is slightly elevated in office BP 132/84 mmHg;  above the goal (<130/80). Home device reading 17/5 points higher when compared in office. Has noted worsening cough since increasing losartan  and carvedilol  Denies SOB, palpitation, chest pain, headaches,or swelling Reiterated the importance of regular exercise and low salt diet   Plan:  Stop taking losartan  50 mg  Start taking chlorthalidone 25 mg once daily Continue taking amlodipine  5 mg daily, carvedilol  6.25 mg bid and clonidine  0.1 for SBP > 150  Patient to keep record of BP readings with heart rate and report to us  at the next visit Patient to follow up with me in 6 weeks  Labs ordered today:  BMET in 10-14 days

## 2024-07-21 NOTE — Progress Notes (Signed)
 Office Visit    Patient Name: Julia Gutierrez Date of Encounter: 07/21/2024  Primary Care Provider:  Oris Camie BRAVO, NP Primary Cardiologist:  Dorn Lesches, MD  Chief Complaint    Hypertension  Significant Past Medical History   CAD 1/25 NSTEMI  CVA Multiple lacunar infarcts  migraine     Allergies  Allergen Reactions   Promethazine Other (See Comments)    Muscle spasms     History of Present Illness    Julia Gutierrez is a 68 y.o. female patient of Dr Lesches, in the office today for hypertension and hyperlipidemia evaluation.   She saw Dr. Lesches most recently in September, with an office BP of 156/952.  He increased her losartan  to 50 mg and carvedilol  to 6.25 mg.  She notes chest pain when her systolic BP is elevated.  Asked that she keep BP log and follow up in a month with pharmacy.  She also had an elevated LDL of 112, and was intolerant to atorvastatin  due to extreme fatigue.    Blood Pressure Goal:  130/80  Current Medications: amlodipine  5 mg daily (am), carvedilol  6.25 mg bid, losartan  50 mg daily, clonidine  0.1 mg prn  Statin intolerant - atorvastatin  fatigue  Previously tried:  hctz - unsure why stopped  Family Hx: father had CABG in his late 86's, mother with multiple MI (first around age 61), one brother deceased from heart disease, other 2 siblings with hypertension, heart disease; 3 children, 2 with hypertension (on medications)   Social Hx:      Tobacco: no  Alcohol:no  Caffeine:2 coffee and maybe 1 tea per day (hot green tea)  Diet: does not eat out; not a big meat eater, will use chicken and fish for protein, along with black beans and chickpeas.  Does well with vegetables (usually fresh); admits to sweet tooth snacking     Exercise: walks daily (manages storage facility)  Home BP readings: home wrist cuff about 70 years old; reads about 5 results off her device in the office - mostly 140 systolic range with one at 172 (she took a clonidine ), that  then dropped to 125 systolic.  When compared to office reading, her technique on the wrist cuff was 17/5 points higher than the office reading   Accessory Clinical Findings    Lab Results  Component Value Date   CREATININE 0.83 05/06/2024   BUN 17 05/06/2024   NA 139 05/06/2024   K 4.1 05/06/2024   CL 104 05/06/2024   CO2 21 05/06/2024   Lab Results  Component Value Date   CHOL 181 12/30/2023   HDL 49 12/30/2023   LDLCALC 112 (H) 12/30/2023   TRIG 112 12/30/2023   CHOLHDL 3.7 12/30/2023   Lab Results  Component Value Date   ALT 14 05/06/2024   AST 19 05/06/2024   ALKPHOS 119 05/06/2024   BILITOT 0.5 05/06/2024   Lab Results  Component Value Date   HGBA1C 5.8 (H) 05/06/2024    Home Medications    Current Outpatient Medications  Medication Sig Dispense Refill   acetaminophen  (TYLENOL ) 500 MG tablet Take 1,000 mg by mouth 2 (two) times daily as needed for moderate pain (pain score 4-6), fever or headache.     amLODipine  (NORVASC ) 5 MG tablet Take 1 tablet (5 mg total) by mouth at bedtime. 90 tablet 2   carvedilol  (COREG ) 6.25 MG tablet Take 1 tablet (6.25 mg total) by mouth 2 (two) times daily with a meal. 180 tablet 3  chlorthalidone (HYGROTON) 25 MG tablet Take 1 tablet (25 mg total) by mouth daily. 90 tablet 3   cloNIDine  (CATAPRES ) 0.1 MG tablet Take 1 tablet (0.1 mg total) by mouth 3 (three) times daily as needed. 30 tablet 3   clopidogrel  (PLAVIX ) 75 MG tablet Take 1 tablet (75 mg total) by mouth daily. 90 tablet 2   isosorbide  mononitrate (IMDUR ) 60 MG 24 hr tablet Take 1 tablet (60 mg total) by mouth daily. 30 tablet 11   losartan  (COZAAR ) 50 MG tablet Take 1 tablet (50 mg total) by mouth daily. 90 tablet 3   rosuvastatin (CRESTOR) 5 MG tablet Take 1 tablet by mouth twice weekly 30 tablet 3   Vitamin D , Ergocalciferol , (DRISDOL ) 1.25 MG (50000 UNIT) CAPS capsule Take 1 capsule (50,000 Units total) by mouth every 7 (seven) days. 12 capsule 3   No current  facility-administered medications for this visit.         Assessment & Plan    Primary hypertension Assessment: BP is slightly elevated in office BP 132/84 mmHg;  above the goal (<130/80). Home device reading 17/5 points higher when compared in office. Has noted worsening cough since increasing losartan  and carvedilol  Denies SOB, palpitation, chest pain, headaches,or swelling Reiterated the importance of regular exercise and low salt diet   Plan:  Stop taking losartan  50 mg  Start taking chlorthalidone 25 mg once daily Continue taking amlodipine  5 mg daily, carvedilol  6.25 mg bid and clonidine  0.1 for SBP > 150  Patient to keep record of BP readings with heart rate and report to us  at the next visit Patient to follow up with me in 6 weeks  Labs ordered today:  BMET in 10-14 days   Allean Mink PharmD CPP [facility]  [address] [place], [place] [address] [phone]

## 2024-07-31 ENCOUNTER — Other Ambulatory Visit (HOSPITAL_BASED_OUTPATIENT_CLINIC_OR_DEPARTMENT_OTHER): Payer: Self-pay

## 2024-08-06 ENCOUNTER — Other Ambulatory Visit (HOSPITAL_BASED_OUTPATIENT_CLINIC_OR_DEPARTMENT_OTHER): Payer: Self-pay

## 2024-09-09 ENCOUNTER — Other Ambulatory Visit (HOSPITAL_BASED_OUTPATIENT_CLINIC_OR_DEPARTMENT_OTHER): Payer: Self-pay

## 2024-09-09 ENCOUNTER — Other Ambulatory Visit: Payer: Self-pay

## 2024-09-09 ENCOUNTER — Other Ambulatory Visit: Payer: Self-pay | Admitting: Nurse Practitioner

## 2024-09-09 DIAGNOSIS — E559 Vitamin D deficiency, unspecified: Secondary | ICD-10-CM

## 2024-09-18 ENCOUNTER — Other Ambulatory Visit (HOSPITAL_BASED_OUTPATIENT_CLINIC_OR_DEPARTMENT_OTHER): Payer: Self-pay

## 2024-09-18 ENCOUNTER — Encounter (HOSPITAL_BASED_OUTPATIENT_CLINIC_OR_DEPARTMENT_OTHER): Payer: Self-pay

## 2024-09-21 ENCOUNTER — Ambulatory Visit: Admitting: Nurse Practitioner

## 2024-09-21 NOTE — Progress Notes (Deleted)
 Office Visit    Patient Name: Julia Gutierrez Date of Encounter: 09/21/2024  Primary Care Provider:  Oris Camie BRAVO, NP Primary Cardiologist:  Dorn Lesches, MD  Chief Complaint    68 year old female with a history of CAD, aortic stenosis, palpitations, hypertension, hyperlipidemia, CVA, bilateral carotid artery stenosis, hypothyroidism, anxiety, depression, and chronic fatigue who presents for follow-up related to CAD and hypertension.   Past Medical History    Past Medical History:  Diagnosis Date   Acute cystitis without hematuria 08/28/2021   Allergy 2002   Atherosclerosis of aorta 10/29/2022   Attention deficit hyperactivity disorder (ADHD), predominantly inattentive type 07/03/2023   Cerebrovascular disease    Cervical spondylosis 05/04/2021   Cervicogenic headache 04/13/2021   Chronic fatigue 04/13/2021   Chronic vertigo 04/13/2021   Colicky RUQ abdominal pain 02/24/2022   Dehydration 05/20/2023   Elevated TSH 11/13/2023   Enlarged glands 06/18/2021   Generalized anxiety disorder    Heart murmur    History of COVID-19    History of non-ST elevation myocardial infarction (NSTEMI) 10/27/2023   Hypocalcemia 11/13/2023   Hypothyroidism    IBS (irritable bowel syndrome)    Increased thirst 04/13/2021   Lumbar pain 12/23/2022   Migraine with status migrainosus, not intractable 05/04/2021   Mixed hyperlipidemia 11/13/2023   Mouth dryness 04/13/2021   Multiple lacunar infarcts    Chronic lacunar infarcts within the bilateral cerebral hemispheric white matter and dorsolateral left thalamus   Muscle spasms of neck 05/20/2023   Palpitations 04/13/2021   Periumbilical abdominal pain 03/07/2023   Primary hypertension 09/17/2022   Primary insomnia 07/03/2023   Pulmonary nodules/lesions, multiple 11/13/2023   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   Rib pain on right side 12/04/2021   Situational depression    seasonal component   Sjogren syndrome with dental  involvement 04/13/2021   Skin lesion 10/29/2022   Tooth infection 05/04/2021   Vertigo 04/13/2021   Vision changes 04/13/2021   Vitamin B12 deficiency 02/24/2022   Past Surgical History:  Procedure Laterality Date   CHOLECYSTECTOMY     LEFT HEART CATH AND CORONARY ANGIOGRAPHY N/A 10/28/2023   Procedure: LEFT HEART CATH AND CORONARY ANGIOGRAPHY;  Surgeon: Verlin Lonni BIRCH, MD;  Location: [facility];  Service: Cardiovascular;  Laterality: N/A;    Allergies  Allergies[1]   Labs/Other Studies Reviewed    The following studies were reviewed today: *** Cardiac Studies & Procedures   ______________________________________________________________________________________________ CARDIAC CATHETERIZATION  CARDIAC CATHETERIZATION 10/28/2023  Conclusion   Prox RCA to Mid RCA lesion is 20% stenosed.   Dist RCA lesion is 60% stenosed.   RPAV lesion is 70% stenosed.   1st Mrg lesion is 40% stenosed.   Prox Cx to Mid Cx lesion is 60% stenosed.   Ost Cx to Prox Cx lesion is 30% stenosed.   Mid LAD lesion is 20% stenosed.  Large caliber LAD that courses to the apex. Mild plaque in the mid vessel Moderate caliber Circumflex with moderate caliber obtuse marginal branch. Mild to moderate non-obstructive disease in the obtuse marginal branch. Moderate disease in the continuation of the small caliber AV groove Circumflex. Large dominant RCA with heavy calcification throughout the proximal through distal vessel. Moderate stenosis mid vessel (60% with heavy calcification). Normal LV systolic function LVEDP=9 mmHg.  Recommendations: She has no focal targets for PCI. There is a moderately stenotic, heavily calcified lesion in the mid to distal RCA. This does not appear to be flow limiting. Her presentation is most likely consistent with  demand ischemia in the setting of elevated blood pressure and moderate underlying CAD. I will add Plavix  and Imdur . Titration of medications as needed  for BP control prior to discharge. If she has recurrent angina despite optimal medical therapy, could consider PCI of the RCA with orbital atherectomy. I would plan right femoral access for her next procedure.  Findings Coronary Findings Diagnostic  Dominance: Right  Left Anterior Descending Vessel is large. Mid LAD lesion is 20% stenosed.  Left Circumflex Vessel is moderate in size. Ost Cx to Prox Cx lesion is 30% stenosed. Prox Cx to Mid Cx lesion is 60% stenosed.  First Obtuse Marginal Branch 1st Mrg lesion is 40% stenosed.  Right Coronary Artery Vessel is large. Prox RCA to Mid RCA lesion is 20% stenosed. The lesion is calcified. Dist RCA lesion is 60% stenosed. The lesion is calcified.  Right Posterior Atrioventricular Artery RPAV lesion is 70% stenosed.  Intervention  No interventions have been documented.     ECHOCARDIOGRAM  ECHOCARDIOGRAM COMPLETE 10/29/2023  Narrative ECHOCARDIOGRAM REPORT    Patient Name:   Julia Gutierrez Date of Exam: 10/29/2023 Medical Rec #:  [id number]      Height:       62.0 in Accession #:    [id number]     Weight:       173.3 lb Date of Birth:  10-21-1955       BSA:          1.799 m Patient Age:    67 years       BP:           107/63 mmHg Patient Gender: F              HR:           72 bpm. Exam Location:  Inpatient  Procedure: 2D Echo, Cardiac Doppler and Color Doppler  Indications:    Chest Pain R07.9  History:        Patient has no prior history of Echocardiogram examinations. Previous Myocardial Infarction, Migraine; Risk Factors:Hypertension.  Sonographer:    Thea Norlander RCS Referring Phys: [id number] ARTIST POUCH  IMPRESSIONS   1. Left ventricular ejection fraction, by estimation, is 65 to 70%. The left ventricle has normal function. The left ventricle has no regional wall motion abnormalities. There is mild left ventricular hypertrophy. Left ventricular diastolic parameters were normal. 2. Right ventricular  systolic function is normal. The right ventricular size is normal. There is mildly elevated pulmonary artery systolic pressure. The estimated right ventricular systolic pressure is 36.9 mmHg. 3. The mitral valve is degenerative. Trivial mitral valve regurgitation. No evidence of mitral stenosis. 4. The aortic valve is calcified. There is moderate calcification of the aortic valve. Aortic valve regurgitation is not visualized. Mild to moderate aortic valve stenosis. Mild AS by gradients (Vmax 2.4 m/s, MG ), moderate by AVA (1.0cm^2) and DI (0.38) 5. The inferior vena cava is dilated in size with <50% respiratory variability, suggesting right atrial pressure of 15 mmHg.  FINDINGS Left Ventricle: Left ventricular ejection fraction, by estimation, is 65 to 70%. The left ventricle has normal function. The left ventricle has no regional wall motion abnormalities. The left ventricular internal cavity size was normal in size. There is mild left ventricular hypertrophy. Left ventricular diastolic parameters were normal.  Right Ventricle: The right ventricular size is normal. No increase in right ventricular wall thickness. Right ventricular systolic function is normal. There is mildly elevated pulmonary artery systolic pressure. The tricuspid regurgitant velocity is  2.34 m/s, and with an assumed right atrial pressure of 15 mmHg, the estimated right ventricular systolic pressure is 36.9 mmHg.  Left Atrium: Left atrial size was normal in size.  Right Atrium: Right atrial size was normal in size.  Pericardium: There is no evidence of pericardial effusion.  Mitral Valve: The mitral valve is degenerative in appearance. Trivial mitral valve regurgitation. No evidence of mitral valve stenosis.  Tricuspid Valve: The tricuspid valve is normal in structure. Tricuspid valve regurgitation is trivial.  Aortic Valve: The aortic valve is calcified. There is moderate calcification of the aortic valve. Aortic  valve regurgitation is not visualized. Mild to moderate aortic stenosis is present. Aortic valve mean gradient measures 9.5 mmHg. Aortic valve peak gradient measures 18.9 mmHg. Aortic valve area, by VTI measures 1.12 cm.  Pulmonic Valve: The pulmonic valve was not well visualized. Pulmonic valve regurgitation is not visualized.  Aorta: The aortic root and ascending aorta are structurally normal, with no evidence of dilitation.  Venous: The inferior vena cava is dilated in size with less than 50% respiratory variability, suggesting right atrial pressure of 15 mmHg.  IAS/Shunts: No atrial level shunt detected by color flow Doppler.   LEFT VENTRICLE PLAX 2D LVIDd:         4.30 cm   Diastology LVIDs:         2.70 cm   LV e' medial:    9.46 cm/s LV PW:         1.00 cm   LV E/e' medial:  7.7 LV IVS:        1.00 cm   LV e' lateral:   9.14 cm/s LVOT diam:     1.80 cm   LV E/e' lateral: 7.9 LV SV:         49 LV SV Index:   27 LVOT Area:     2.54 cm   RIGHT VENTRICLE             IVC RV S prime:     12.70 cm/s  IVC diam: 2.20 cm TAPSE (M-mode): 2.3 cm  LEFT ATRIUM             Index        RIGHT ATRIUM           Index LA diam:        3.80 cm 2.11 cm/m   RA Area:     16.50 cm LA Vol (A2C):   44.4 ml 24.69 ml/m  RA Volume:   38.90 ml  21.63 ml/m LA Vol (A4C):   46.9 ml 26.07 ml/m LA Biplane Vol: 46.5 ml 25.85 ml/m AORTIC VALVE AV Area (Vmax):    1.15 cm AV Area (Vmean):   1.12 cm AV Area (VTI):     1.12 cm AV Vmax:           217.50 cm/s AV Vmean:          142.000 cm/s AV VTI:            0.435 m AV Peak Grad:      18.9 mmHg AV Mean Grad:      9.5 mmHg LVOT Vmax:         98.50 cm/s LVOT Vmean:        62.300 cm/s LVOT VTI:          0.192 m LVOT/AV VTI ratio: 0.44  AORTA Ao Root diam: 2.90 cm Ao Asc diam:  3.70 cm  MITRAL VALVE  TRICUSPID VALVE MV Area (PHT): 2.39 cm    TR Peak grad:   21.9 mmHg MV Decel Time: 317 msec    TR Vmax:        234.00 cm/s MV E  velocity: 72.40 cm/s MV A velocity: 97.00 cm/s  SHUNTS MV E/A ratio:  0.75        Systemic VTI:  0.19 m Systemic Diam: 1.80 cm  Lonni Nanas MD Electronically signed by Lonni Nanas MD Signature Date/Time: 10/29/2023/4:47:16 PM    Final          ______________________________________________________________________________________________     Recent Labs: 05/06/2024: ALT 14; BUN 17; Creatinine, Ser 0.83; Hemoglobin 12.9; Platelets 360; Potassium 4.1; Sodium 139; TSH 2.770  Recent Lipid Panel    Component Value Date/Time   CHOL 181 12/30/2023 0833   TRIG 112 12/30/2023 0833   HDL 49 12/30/2023 0833   CHOLHDL 3.7 12/30/2023 0833   CHOLHDL 3.9 10/29/2023 0246   VLDL 20 10/29/2023 0246   LDLCALC 112 (H) 12/30/2023 0833    History of Present Illness    68 year old female with the above past medical history including CAD, aortic stenosis, palpitations, hypertension, hyperlipidemia, CVA, bilateral carotid artery stenosis, hypothyroidism, anxiety, depression, and chronic fatigue.   Prior echocardiogram in 06/2022 in the setting of CVA showed EF 60 to 65%, no RWMA, mild AS, mild MR.  She presented to the ED on 10/27/2023 in the setting of chest pain that radiated into her left arm and back.  Blood pressure was elevated 213/99.  Troponin was elevated.  CT of the chest/abdomen/ pelvis showed scattered atherosclerotic plaque, no dissection or aneurysm, coronary artery calcifications, hiatal hernia, juxtapleural small lung nodules. Repeat noncontrast CT was recommended in 3 to 6 months. Cardiology was consulted in the setting of NSTEMI. LHC on 1/21 revealed large caliber LAD that courses to the apex. Mild plaque in the mid vessel. Moderate caliber Circumflex with moderate caliber obtuse marginal branch. Mild to moderate non-obstructive disease in the obtuse marginal branch. Moderate disease in the continuation of the small caliber AV groove Circumflex. Large dominant RCA with  heavy calcification throughout the proximal through distal vessel. Moderate stenosis mid vessel (60% with heavy calcification). With no focal PCI targets, medical management of NSTEMI was advised.  It was felt that her NSTEMI was due to demand ischemia in the setting of hypertensive urgency.  She was started on DAPT with aspirin  and Plavix  in addition to Imdur , Lipitor . Echocardiogram showed EF 65 to 70%, normal LV function, no RWMA, LVH, normal RV systolic function, mild to moderate aortic valve stenosis, mean gradient 9.5 mmHg. She was last seen in the office on 06/22/2024 and was stable from a cardiac standpoint.  She denied symptoms concerning for angina.  Carotid ultrasound in 10/25 revealed 1 to 39% B ICA stenosis.  Repeat study was recommended as clinically indicated.  She saw Pharm.D. for management of hypertension on 07/21/2024.  Losartan  was discontinued.  She was started on chlorthalidone .   She presents today for follow-up accompanied by her daughter.  Since her last visit she    1. CAD: LHC on 10/28/2023 revealed mild to moderate non-obstructive disease (see details above). With no focal PCI targets, medical management of NSTEMI was advised.   Echocardiogram showed EF 65 to 70%, normal LV function, no RWMA, LVH, normal RV systolic function, mild to moderate aortic valve stenosis, mean gradient 9.5 mmHg. It was felt that her NSTEMI was due to demand ischemia in the setting of hypertensive urgency.  Continue ASA, Plavix , carvedilol , losartan , amlodipine , and Lipitor .   2. Aortic stenosis: Moderate on most recent echo.  She has stable nonpitting bilateral lower extremity edema, otherwise asymptomatic, generally euvolemic and well compensated exam. Plan for repeat echo in 1 year (10/2024).    3. Hypertension: BP mildly elevated in office today. Will have her keep a BP log and report BP consider greater than 140/80.  Will increase Imdur  as above, otherwise, continue current antihypertensive regimen.     4. Hyperlipidemia: LDL was 109 in 10/2023. Recently started on Lipitor . She reports feeling terrible. She is exhausted and feels bad all over. Advised 2-week statin holiday.  She will update us  as to her symptoms.  If symptoms improve with statin holiday, consider trialing low-dose rosuvastatin .  If she does not tolerate statin therapy, would recommend referral to lipid clinic Pharm.D. for consideration of alternative lipid-lowering therapy.  Will update fasting lipid panel, CMET.   5. History of CVA: Continue ASA, Plavix , Lipitor .    6. Abnormal TSH: TSH was 5.103 in 10/2023.  T4 was normal.  Recommend follow-up with PCP.    7. Disposition: Follow-up in  Home Medications    Current Outpatient Medications  Medication Sig Dispense Refill   acetaminophen  (TYLENOL ) 500 MG tablet Take 1,000 mg by mouth 2 (two) times daily as needed for moderate pain (pain score 4-6), fever or headache.     amLODipine  (NORVASC ) 5 MG tablet Take 1 tablet (5 mg total) by mouth at bedtime. 90 tablet 2   carvedilol  (COREG ) 6.25 MG tablet Take 1 tablet (6.25 mg total) by mouth 2 (two) times daily with a meal. 180 tablet 3   chlorthalidone  (HYGROTON ) 25 MG tablet Take 1 tablet (25 mg total) by mouth daily. 90 tablet 3   cloNIDine  (CATAPRES ) 0.1 MG tablet Take 1 tablet (0.1 mg total) by mouth 3 (three) times daily as needed. 30 tablet 3   clopidogrel  (PLAVIX ) 75 MG tablet Take 1 tablet (75 mg total) by mouth daily. 90 tablet 2   isosorbide  mononitrate (IMDUR ) 60 MG 24 hr tablet Take 1 tablet (60 mg total) by mouth daily. 30 tablet 11   losartan  (COZAAR ) 50 MG tablet Take 1 tablet (50 mg total) by mouth daily. 90 tablet 3   rosuvastatin  (CRESTOR ) 5 MG tablet Take 1 tablet by mouth twice weekly 30 tablet 3   Vitamin D , Ergocalciferol , (DRISDOL ) 1.25 MG (50000 UNIT) CAPS capsule Take 1 capsule (50,000 Units total) by mouth every 7 (seven) days. 12 capsule 3   No current facility-administered medications for this  visit.     Review of Systems    ***.  All other systems reviewed and are otherwise negative except as noted above.    Physical Exam    VS:  There were no vitals taken for this visit. , BMI There is no height or weight on file to calculate BMI.     GEN: Well nourished, well developed, in no acute distress. HEENT: normal. Neck: Supple, no JVD, carotid bruits, or masses. Cardiac: RRR, no murmurs, rubs, or gallops. No clubbing, cyanosis, edema.  Radials/DP/PT 2+ and equal bilaterally.  Respiratory:  Respirations regular and unlabored, clear to auscultation bilaterally. GI: Soft, nontender, nondistended, BS + x 4. MS: no deformity or atrophy. Skin: warm and dry, no rash. Neuro:  Strength and sensation are intact. Psych: Normal affect.  Accessory Clinical Findings    ECG personally reviewed by me today -    - no acute changes.   Lab Results  Component Value Date   WBC 10.9 (H) 05/06/2024   HGB 12.9 05/06/2024   HCT 41.2 05/06/2024   MCV 85 05/06/2024   PLT 360 05/06/2024   Lab Results  Component Value Date   CREATININE 0.83 05/06/2024   BUN 17 05/06/2024   NA 139 05/06/2024   K 4.1 05/06/2024   CL 104 05/06/2024   CO2 21 05/06/2024   Lab Results  Component Value Date   ALT 14 05/06/2024   AST 19 05/06/2024   ALKPHOS 119 05/06/2024   BILITOT 0.5 05/06/2024   Lab Results  Component Value Date   CHOL 181 12/30/2023   HDL 49 12/30/2023   LDLCALC 112 (H) 12/30/2023   TRIG 112 12/30/2023   CHOLHDL 3.7 12/30/2023    Lab Results  Component Value Date   HGBA1C 5.8 (H) 05/06/2024    Assessment & Plan    1.  ***  No BP recorded.  {Refresh Note OR Click here to enter BP  :1}***   Damien JAYSON Braver, NP 09/21/2024, 7:00 AM       [1]  Allergies Allergen Reactions   Promethazine Other (See Comments)    Muscle spasms

## 2024-09-23 ENCOUNTER — Ambulatory Visit: Admitting: Pharmacist Clinician (PhC)/ Clinical Pharmacy Specialist

## 2024-10-05 ENCOUNTER — Ambulatory Visit: Payer: Self-pay

## 2024-10-09 ENCOUNTER — Other Ambulatory Visit (HOSPITAL_BASED_OUTPATIENT_CLINIC_OR_DEPARTMENT_OTHER): Payer: Self-pay

## 2024-10-12 ENCOUNTER — Other Ambulatory Visit (HOSPITAL_BASED_OUTPATIENT_CLINIC_OR_DEPARTMENT_OTHER): Payer: Self-pay

## 2024-10-14 ENCOUNTER — Other Ambulatory Visit (HOSPITAL_BASED_OUTPATIENT_CLINIC_OR_DEPARTMENT_OTHER): Payer: Self-pay

## 2024-10-19 ENCOUNTER — Ambulatory Visit (HOSPITAL_COMMUNITY)
Admission: RE | Admit: 2024-10-19 | Discharge: 2024-10-19 | Disposition: A | Discharge status: Home / Self Care | Source: Ambulatory Visit | Attending: Cardiology | Admitting: Cardiology

## 2024-10-19 DIAGNOSIS — E785 Hyperlipidemia, unspecified: Secondary | ICD-10-CM

## 2024-10-19 DIAGNOSIS — I25118 Atherosclerotic heart disease of native coronary artery with other forms of angina pectoris: Secondary | ICD-10-CM | POA: Diagnosis present

## 2024-10-19 DIAGNOSIS — E782 Mixed hyperlipidemia: Secondary | ICD-10-CM

## 2024-10-19 DIAGNOSIS — I350 Nonrheumatic aortic (valve) stenosis: Secondary | ICD-10-CM

## 2024-10-19 DIAGNOSIS — I10 Essential (primary) hypertension: Secondary | ICD-10-CM | POA: Diagnosis present

## 2024-10-19 LAB — ECHOCARDIOGRAM COMPLETE
AR max vel: 1.31 cm2
AV Area VTI: 1.35 cm2
AV Area mean vel: 1.31 cm2
AV Mean grad: 14 mmHg
AV Peak grad: 25.3 mmHg
Ao pk vel: 2.52 m/s
Area-P 1/2: 2.62 cm2
S' Lateral: 2.3 cm

## 2024-10-20 ENCOUNTER — Encounter: Payer: Self-pay | Admitting: Nurse Practitioner

## 2024-10-20 NOTE — Addendum Note (Signed)
 Addended by: LORRENE FEDERICO CROME on: 10/20/2024 08:06 AM   Modules accepted: Orders

## 2024-11-05 ENCOUNTER — Ambulatory Visit: Admitting: Nurse Practitioner

## 2024-11-05 NOTE — Progress Notes (Unsigned)
 "  Office Visit    Patient Name: Julia Gutierrez Date of Encounter: 11/05/2024  Primary Care Provider:  Oris Camie BRAVO, NP Primary Cardiologist:  Dorn Lesches, MD  Chief Complaint    69 year old female with a history of CAD, aortic stenosis, palpitations, hypertension, hyperlipidemia, CVA, carotid artery stenosis, hypothyroidism, anxiety, depression, and chronic fatigue who presents for follow-up related to CAD and hypertension.   Past Medical History    Past Medical History:  Diagnosis Date   Acute cystitis without hematuria 08/28/2021   Allergy 2002   Atherosclerosis of aorta 10/29/2022   Attention deficit hyperactivity disorder (ADHD), predominantly inattentive type 07/03/2023   Cerebrovascular disease    Cervical spondylosis 05/04/2021   Cervicogenic headache 04/13/2021   Chronic fatigue 04/13/2021   Chronic vertigo 04/13/2021   Colicky RUQ abdominal pain 02/24/2022   Dehydration 05/20/2023   Elevated TSH 11/13/2023   Enlarged glands 06/18/2021   Generalized anxiety disorder    Heart murmur    History of COVID-19    History of non-ST elevation myocardial infarction (NSTEMI) 10/27/2023   Hypocalcemia 11/13/2023   Hypothyroidism    IBS (irritable bowel syndrome)    Increased thirst 04/13/2021   Lumbar pain 12/23/2022   Migraine with status migrainosus, not intractable 05/04/2021   Mixed hyperlipidemia 11/13/2023   Mouth dryness 04/13/2021   Multiple lacunar infarcts    Chronic lacunar infarcts within the bilateral cerebral hemispheric white matter and dorsolateral left thalamus   Muscle spasms of neck 05/20/2023   Palpitations 04/13/2021   Periumbilical abdominal pain 03/07/2023   Primary hypertension 09/17/2022   Primary insomnia 07/03/2023   Pulmonary nodules/lesions, multiple 11/13/2023   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   Rib pain on right side 12/04/2021   Situational depression    seasonal component   Sjogren syndrome with dental involvement  04/13/2021   Skin lesion 10/29/2022   Tooth infection 05/04/2021   Vertigo 04/13/2021   Vision changes 04/13/2021   Vitamin B12 deficiency 02/24/2022   Past Surgical History:  Procedure Laterality Date   CHOLECYSTECTOMY     LEFT HEART CATH AND CORONARY ANGIOGRAPHY N/A 10/28/2023   Procedure: LEFT HEART CATH AND CORONARY ANGIOGRAPHY;  Surgeon: Verlin Lonni BIRCH, MD;  Location: [facility];  Service: Cardiovascular;  Laterality: N/A;    Allergies  Allergies[1]   Labs/Other Studies Reviewed    The following studies were reviewed today:  Cardiac Studies & Procedures   ______________________________________________________________________________________________ CARDIAC CATHETERIZATION  CARDIAC CATHETERIZATION 10/28/2023  Conclusion   Prox RCA to Mid RCA lesion is 20% stenosed.   Dist RCA lesion is 60% stenosed.   RPAV lesion is 70% stenosed.   1st Mrg lesion is 40% stenosed.   Prox Cx to Mid Cx lesion is 60% stenosed.   Ost Cx to Prox Cx lesion is 30% stenosed.   Mid LAD lesion is 20% stenosed.  Large caliber LAD that courses to the apex. Mild plaque in the mid vessel Moderate caliber Circumflex with moderate caliber obtuse marginal branch. Mild to moderate non-obstructive disease in the obtuse marginal branch. Moderate disease in the continuation of the small caliber AV groove Circumflex. Large dominant RCA with heavy calcification throughout the proximal through distal vessel. Moderate stenosis mid vessel (60% with heavy calcification). Normal LV systolic function LVEDP=9 mmHg.  Recommendations: She has no focal targets for PCI. There is a moderately stenotic, heavily calcified lesion in the mid to distal RCA. This does not appear to be flow limiting. Her presentation is most likely consistent with  demand ischemia in the setting of elevated blood pressure and moderate underlying CAD. I will add Plavix  and Imdur . Titration of medications as needed for BP control  prior to discharge. If she has recurrent angina despite optimal medical therapy, could consider PCI of the RCA with orbital atherectomy. I would plan right femoral access for her next procedure.  Findings Coronary Findings Diagnostic  Dominance: Right  Left Anterior Descending Vessel is large. Mid LAD lesion is 20% stenosed.  Left Circumflex Vessel is moderate in size. Ost Cx to Prox Cx lesion is 30% stenosed. Prox Cx to Mid Cx lesion is 60% stenosed.  First Obtuse Marginal Branch 1st Mrg lesion is 40% stenosed.  Right Coronary Artery Vessel is large. Prox RCA to Mid RCA lesion is 20% stenosed. The lesion is calcified. Dist RCA lesion is 60% stenosed. The lesion is calcified.  Right Posterior Atrioventricular Artery RPAV lesion is 70% stenosed.  Intervention  No interventions have been documented.     ECHOCARDIOGRAM  ECHOCARDIOGRAM COMPLETE 10/19/2024  Narrative ECHOCARDIOGRAM REPORT    Patient Name:   Julia Gutierrez Date of Exam: 10/19/2024 Medical Rec #:  [id number]      Height:       62.0 in Accession #:    [id number]     Weight:       185.0 lb Date of Birth:  1956/04/19       BSA:          1.849 m Patient Age:    68 years       BP:           32/84 mmHg Patient Gender: F              HR:           66 bpm. Exam Location:  [address]  Procedure: 2D Echo, Cardiac Doppler, Color Doppler, 3D Echo and Strain Analysis (Both Spectral and Color Flow Doppler were utilized during procedure).  Indications:    I25.118 CAD I35.0 AS  History:        Patient has prior history of Echocardiogram examinations, most recent 10/29/2023. CAD and Previous Myocardial Infarction, AS; Risk Factors:Hypertension and Dyslipidemia.  Sonographer:    Elsie Bohr RDCS Referring Phys: [phone] JONATHAN J BERRY  IMPRESSIONS   1. Left ventricular ejection fraction, by estimation, is 60 to 65%. Left ventricular ejection fraction by 3D volume is 57 %. The left ventricle has normal  function. The left ventricle has no regional wall motion abnormalities. There is moderate left ventricular hypertrophy. Left ventricular diastolic parameters were normal. The average left ventricular global longitudinal strain is -21.2 %. The global longitudinal strain is normal. 2. Right ventricular systolic function is normal. The right ventricular size is normal. There is mildly elevated pulmonary artery systolic pressure. The estimated right ventricular systolic pressure is 36.3 mmHg. 3. Left atrial size was mildly dilated. 4. The mitral valve is degenerative. Mild mitral valve regurgitation. No evidence of mitral stenosis. 5. The aortic valve is calcified. Unable to determine aortic valve morphology due to image quality. There is moderate to severe calcification of the aortic valve. Aortic valve regurgitation is not visualized. Mild aortic stenosis (peak velocity 2.19m/s, MG , AVA VTI 1.4cm2, DI 0.53). 6. The inferior vena cava is normal in size with <50% respiratory variability, suggesting right atrial pressure of 8 mmHg. 7. Ascending aorta measurements are within normal limits for age when indexed to body surface area.  Comparison(s): A prior study was performed on 10/29/2023. Reported LVEF  65-70%,mild to moderate AS (peak velocity 2.12m/s, MG 10 mmHg, AVA VTI 1cm2, DI 0.38).  Conclusion(s)/Recommendation(s): If clinically indicated consider different modality to evaluate the severity of aortic stenosis.  FINDINGS Left Ventricle: Left ventricular ejection fraction, by estimation, is 60 to 65%. Left ventricular ejection fraction by 3D volume is 57 %. The left ventricle has normal function. The left ventricle has no regional wall motion abnormalities. The average left ventricular global longitudinal strain is -21.2 %. Strain was performed and the global longitudinal strain is normal. The left ventricular internal cavity size was normal in size. There is moderate left ventricular hypertrophy.  Left ventricular diastolic parameters were normal.  Right Ventricle: The right ventricular size is normal. No increase in right ventricular wall thickness. Right ventricular systolic function is normal. There is mildly elevated pulmonary artery systolic pressure. The tricuspid regurgitant velocity is 2.66 m/s, and with an assumed right atrial pressure of 8 mmHg, the estimated right ventricular systolic pressure is 36.3 mmHg.  Left Atrium: Left atrial size was mildly dilated.  Right Atrium: Right atrial size was normal in size.  Pericardium: There is no evidence of pericardial effusion.  Mitral Valve: The mitral valve is degenerative in appearance. Mild mitral annular calcification. Mild mitral valve regurgitation. No evidence of mitral valve stenosis.  Tricuspid Valve: The tricuspid valve is grossly normal. Tricuspid valve regurgitation is mild . No evidence of tricuspid stenosis.  Aortic Valve: The aortic valve is calcified. There is moderate to severe calcification of the aortic valve. Aortic valve regurgitation is not visualized. Mild aortic stenosis (peak velocity 2.67m/s, MG , AVA VTI 1.4cm2, DI 0.53). Aortic valve mean gradient measures 14.0 mmHg. Aortic valve peak gradient measures 25.3 mmHg. Aortic valve area, by VTI measures 1.35 cm.  Pulmonic Valve: The pulmonic valve was not well visualized. Pulmonic valve regurgitation is not visualized. No evidence of pulmonic stenosis.  Aorta: The aortic root and ascending aorta are structurally normal, with no evidence of dilitation. Ascending aorta measurements are within normal limits for age when indexed to body surface area.  Venous: The inferior vena cava is normal in size with less than 50% respiratory variability, suggesting right atrial pressure of 8 mmHg.  IAS/Shunts: The atrial septum is grossly normal.  Additional Comments: 3D was performed not requiring image post processing on an independent workstation and was  normal.   LEFT VENTRICLE PLAX 2D LVIDd:         3.90 cm         Diastology LVIDs:         2.30 cm         LV e' medial:    9.25 cm/s LV PW:         1.20 cm         LV E/e' medial:  10.8 LV IVS:        1.50 cm         LV e' lateral:   10.30 cm/s LVOT diam:     1.80 cm         LV E/e' lateral: 9.7 LV SV:         76 LV SV Index:   41              2D Longitudinal LVOT Area:     2.54 cm        Strain LV IVRT:       100 msec        2D Strain GLS   -19.7 % (A4C): 2D Strain  GLS   -21.6 % (A3C): 2D Strain GLS   -22.4 % (A2C): 2D Strain GLS   -21.2 % Avg:  3D Volume EF LV 3D EF:    Left ventricul ar ejection fraction by 3D volume is 57 %.  3D Volume EF: 3D EF:        57 % LV EDV:       126 ml LV ESV:       54 ml LV SV:        72 ml  RIGHT VENTRICLE             IVC RV S prime:     16.90 cm/s  IVC diam: 1.10 cm TAPSE (M-mode): 1.8 cm RVSP:           36.3 mmHg   PULMONARY VEINS Diastolic Velocity: 35.50 cm/s S/D Velocity:       1.50 Systolic Velocity:  54.70 cm/s  LEFT ATRIUM             Index        RIGHT ATRIUM           Index LA diam:        4.40 cm 2.38 cm/m   RA Pressure: 8.00 mmHg LA Vol (A2C):   68.7 ml 37.15 ml/m  RA Area:     13.30 cm LA Vol (A4C):   63.3 ml 34.23 ml/m  RA Volume:   29.80 ml  16.11 ml/m LA Biplane Vol: 67.0 ml 36.23 ml/m AORTIC VALVE AV Area (Vmax):    1.31 cm AV Area (Vmean):   1.31 cm AV Area (VTI):     1.35 cm AV Vmax:           251.50 cm/s AV Vmean:          171.000 cm/s AV VTI:            0.564 m AV Peak Grad:      25.3 mmHg AV Mean Grad:      14.0 mmHg LVOT Vmax:         129.00 cm/s LVOT Vmean:        87.900 cm/s LVOT VTI:          0.300 m LVOT/AV VTI ratio: 0.53  AORTA Ao Root diam: 2.90 cm Ao Asc diam:  3.80 cm  MITRAL VALVE                TRICUSPID VALVE MV Area (PHT): 2.62 cm     TR Peak grad:   28.3 mmHg MV Decel Time: 289 msec     TR Vmax:        266.00 cm/s MV E velocity: 99.60 cm/s   Estimated RAP:  8.00  mmHg MV A velocity: 112.00 cm/s  RVSP:           36.3 mmHg MV E/A ratio:  0.89 SHUNTS Systemic VTI:  0.30 m Systemic Diam: 1.80 cm  Sunit Tolia Electronically signed by Madonna Large Signature Date/Time: 10/19/2024/1:40:12 PM    Final          ______________________________________________________________________________________________     Recent Labs: 05/06/2024: ALT 14; BUN 17; Creatinine, Ser 0.83; Hemoglobin 12.9; Platelets 360; Potassium 4.1; Sodium 139; TSH 2.770  Recent Lipid Panel    Component Value Date/Time   CHOL 181 12/30/2023 0833   TRIG 112 12/30/2023 0833   HDL 49 12/30/2023 0833   CHOLHDL 3.7 12/30/2023 0833   CHOLHDL 3.9 10/29/2023 0246   VLDL 20 10/29/2023 0246   LDLCALC  112 (H) 12/30/2023 9166    History of Present Illness    69 year old female with the above past medical history including CAD, aortic stenosis, palpitations, hypertension, hyperlipidemia, carotid artery stenosis, CVA, hypothyroidism, anxiety, depression, and chronic fatigue.   Prior echocardiogram in 06/2022 in the setting of CVA showed EF 60 to 65%, no RWMA, mild AS, mild MR.  She was hospitalized in January 2025 in the setting of NSTEMI.  CT angio of the chest showed scattered atherosclerotic plaque, no dissection or aneurysm, coronary artery calcifications, hiatal hernia, juxtapleural small lung nodules. Repeat noncontrast CT was recommended in 3 to 6 months. Cardiac catheterization in 10/2019 revealed large caliber LAD that courses to the apex. Mild plaque in the mid vessel. Moderate caliber Circumflex with moderate caliber obtuse marginal branch. Mild to moderate non-obstructive disease in the obtuse marginal branch. Moderate disease in the continuation of the small caliber AV groove Circumflex. Large dominant RCA with heavy calcification throughout the proximal through distal vessel. Moderate stenosis mid vessel (60% with heavy calcification). With no focal PCI targets, medical management of  NSTEMI was advised.  It was felt that her NSTEMI was due to demand ischemia in the setting of hypertensive urgency. Echocardiogram in 10/2023 showed EF 65 to 70%, normal LV function, no RWMA, LVH, normal RV systolic function, mild to moderate aortic valve stenosis, mean gradient 9.5 mmHg. She was last seen in the office on 06/22/2024, stable from a cardiac standpoint.  BP was elevated. Losartan  and carvedilol  were increased.  She saw Pharm.D. for hypertension in 07/2024.  She reported a cough with increased losartan  dosing.  Losartan  will discontinue.  She was started on chlorthalidone  25 mg daily.  Carotid ultrasound in 07/2024 revealed 1 to 39% B ICA stenosis.  Repeat study was recommended as clinically indicated.  Echocardiogram in 10/2024 showed EF 60 to 65%, normal LV function, no RWMA, moderate LVH, normal RV systolic function, mildly elevated PASP, mild mitral valve regurgitation, mild aortic valve stenosis, mean gradient 14 mmHg.   She presents today for follow-up accompanied by her daughter.  Since her last visit she   1. CAD: LHC on 10/28/2023 revealed mild to moderate non-obstructive disease (see details above). With no focal PCI targets, medical management of NSTEMI was advised.   Echocardiogram showed EF 65 to 70%, normal LV function, no RWMA, LVH, normal RV systolic function, mild to moderate aortic valve stenosis, mean gradient 9.5 mmHg. It was felt that her NSTEMI was due to demand ischemia in the setting of hypertensive urgency.   no association with physical exertion.  Today she reports feeling terrible. She is exhausted and feels bad all over.  She has had intermittent angina. She will note a burning sensation in her chest, similar to her symptoms that prompted her hospitalization in January, symptoms are not associated with exertion, and can last for up to 2 days.  Some days she has no symptoms at all.  Will increase Imdur  to 60 mg daily.  Reviewed ED precautions. Continue ASA, Plavix ,  carvedilol , losartan , amlodipine , and Lipitor .   2. Aortic stenosis: Moderate on most recent echo.  She has stable nonpitting bilateral lower extremity edema, otherwise asymptomatic, generally euvolemic and well compensated exam. Plan for repeat echo in 1 year (10/2024).    3. Hypertension: BP mildly elevated in office today. Will have her keep a BP log and report BP consider greater than 140/80.  Will increase Imdur  as above, otherwise, continue current antihypertensive regimen.    4. Hyperlipidemia: LDL was 109  in 10/2023. Recently started on Lipitor . She reports feeling terrible. She is exhausted and feels bad all over. Advised 2-week statin holiday.  She will update us  as to her symptoms.  If symptoms improve with statin holiday, consider trialing low-dose rosuvastatin .  If she does not tolerate statin therapy, would recommend referral to lipid clinic Pharm.D. for consideration of alternative lipid-lowering therapy.  Will update fasting lipid panel, CMET.   5. History of CVA: Continue ASA, Plavix , Lipitor .    6. Abnormal TSH: TSH was 5.103 in 10/2023.  T4 was normal.  Recommend follow-up with PCP.    7. Disposition: Follow-up in  Home Medications    Current Outpatient Medications  Medication Sig Dispense Refill   acetaminophen  (TYLENOL ) 500 MG tablet Take 1,000 mg by mouth 2 (two) times daily as needed for moderate pain (pain score 4-6), fever or headache.     amLODipine  (NORVASC ) 5 MG tablet Take 1 tablet (5 mg total) by mouth at bedtime. 90 tablet 2   carvedilol  (COREG ) 6.25 MG tablet Take 1 tablet (6.25 mg total) by mouth 2 (two) times daily with a meal. 180 tablet 3   chlorthalidone  (HYGROTON ) 25 MG tablet Take 1 tablet (25 mg total) by mouth daily. 90 tablet 3   cloNIDine  (CATAPRES ) 0.1 MG tablet Take 1 tablet (0.1 mg total) by mouth 3 (three) times daily as needed. 30 tablet 3   clopidogrel  (PLAVIX ) 75 MG tablet Take 1 tablet (75 mg total) by mouth daily. 90 tablet 2   isosorbide   mononitrate (IMDUR ) 60 MG 24 hr tablet Take 1 tablet (60 mg total) by mouth daily. 30 tablet 11   losartan  (COZAAR ) 50 MG tablet Take 1 tablet (50 mg total) by mouth daily. 90 tablet 3   rosuvastatin  (CRESTOR ) 5 MG tablet Take 1 tablet by mouth twice weekly 30 tablet 3   Vitamin D , Ergocalciferol , (DRISDOL ) 1.25 MG (50000 UNIT) CAPS capsule Take 1 capsule (50,000 Units total) by mouth every 7 (seven) days. 12 capsule 3   No current facility-administered medications for this visit.     Review of Systems    ***.  All other systems reviewed and are otherwise negative except as noted above.    Physical Exam    VS:  There were no vitals taken for this visit. , BMI There is no height or weight on file to calculate BMI.     GEN: Well nourished, well developed, in no acute distress. HEENT: normal. Neck: Supple, no JVD, carotid bruits, or masses. Cardiac: RRR, no murmurs, rubs, or gallops. No clubbing, cyanosis, edema.  Radials/DP/PT 2+ and equal bilaterally.  Respiratory:  Respirations regular and unlabored, clear to auscultation bilaterally. GI: Soft, nontender, nondistended, BS + x 4. MS: no deformity or atrophy. Skin: warm and dry, no rash. Neuro:  Strength and sensation are intact. Psych: Normal affect.  Accessory Clinical Findings    ECG personally reviewed by me today -    - no acute changes.   Lab Results  Component Value Date   WBC 10.9 (H) 05/06/2024   HGB 12.9 05/06/2024   HCT 41.2 05/06/2024   MCV 85 05/06/2024   PLT 360 05/06/2024   Lab Results  Component Value Date   CREATININE 0.83 05/06/2024   BUN 17 05/06/2024   NA 139 05/06/2024   K 4.1 05/06/2024   CL 104 05/06/2024   CO2 21 05/06/2024   Lab Results  Component Value Date   ALT 14 05/06/2024   AST 19 05/06/2024  ALKPHOS 119 05/06/2024   BILITOT 0.5 05/06/2024   Lab Results  Component Value Date   CHOL 181 12/30/2023   HDL 49 12/30/2023   LDLCALC 112 (H) 12/30/2023   TRIG 112 12/30/2023   CHOLHDL  3.7 12/30/2023    Lab Results  Component Value Date   HGBA1C 5.8 (H) 05/06/2024    Assessment & Plan    1.  ***  No BP recorded.  {Refresh Note OR Click here to enter BP  :1}***   Damien JAYSON Braver, NP 11/05/2024, 6:11 AM       [1]  Allergies Allergen Reactions   Promethazine Other (See Comments)    Muscle spasms    "

## 2024-11-08 ENCOUNTER — Other Ambulatory Visit (HOSPITAL_BASED_OUTPATIENT_CLINIC_OR_DEPARTMENT_OTHER): Payer: Self-pay

## 2024-11-11 ENCOUNTER — Other Ambulatory Visit: Payer: Self-pay

## 2024-11-11 ENCOUNTER — Other Ambulatory Visit (HOSPITAL_BASED_OUTPATIENT_CLINIC_OR_DEPARTMENT_OTHER): Payer: Self-pay

## 2024-11-15 ENCOUNTER — Encounter: Payer: Self-pay | Admitting: Nurse Practitioner

## 2024-11-30 ENCOUNTER — Ambulatory Visit: Admitting: Nurse Practitioner
